# Patient Record
Sex: Female | Born: 1955
Health system: Southern US, Community
[De-identification: ages and names within clinical notes are randomized; demographics above are authoritative.]

## PROBLEM LIST (undated history)

## (undated) DIAGNOSIS — I10 Essential (primary) hypertension: Secondary | ICD-10-CM

## (undated) DIAGNOSIS — R7303 Prediabetes: Secondary | ICD-10-CM

## (undated) DIAGNOSIS — M533 Sacrococcygeal disorders, not elsewhere classified: Secondary | ICD-10-CM

## (undated) DIAGNOSIS — M1990 Unspecified osteoarthritis, unspecified site: Secondary | ICD-10-CM

## (undated) DIAGNOSIS — D75A Glucose-6-phosphate dehydrogenase (G6PD) deficiency without anemia: Secondary | ICD-10-CM

## (undated) DIAGNOSIS — Z973 Presence of spectacles and contact lenses: Secondary | ICD-10-CM

## (undated) DIAGNOSIS — D649 Anemia, unspecified: Secondary | ICD-10-CM

## (undated) DIAGNOSIS — R519 Headache, unspecified: Secondary | ICD-10-CM

## (undated) DIAGNOSIS — F329 Major depressive disorder, single episode, unspecified: Secondary | ICD-10-CM

## (undated) DIAGNOSIS — G459 Transient cerebral ischemic attack, unspecified: Secondary | ICD-10-CM

## (undated) DIAGNOSIS — G971 Other reaction to spinal and lumbar puncture: Secondary | ICD-10-CM

## (undated) DIAGNOSIS — R51 Headache: Secondary | ICD-10-CM

## (undated) DIAGNOSIS — F32A Depression, unspecified: Secondary | ICD-10-CM

## (undated) DIAGNOSIS — Z8719 Personal history of other diseases of the digestive system: Secondary | ICD-10-CM

## (undated) DIAGNOSIS — K219 Gastro-esophageal reflux disease without esophagitis: Secondary | ICD-10-CM

## (undated) DIAGNOSIS — M797 Fibromyalgia: Secondary | ICD-10-CM

## (undated) DIAGNOSIS — J45909 Unspecified asthma, uncomplicated: Secondary | ICD-10-CM

## (undated) DIAGNOSIS — M5416 Radiculopathy, lumbar region: Secondary | ICD-10-CM

## (undated) HISTORY — DX: Headache: R51

## (undated) HISTORY — DX: Radiculopathy, lumbar region: M54.16

## (undated) HISTORY — PX: LUMBAR LAMINECTOMY: SHX95

## (undated) HISTORY — PX: COLONOSCOPY: SHX174

## (undated) HISTORY — PX: TONSILLECTOMY: SUR1361

## (undated) HISTORY — PX: SPINAL CORD STIMULATOR IMPLANT: SHX2422

## (undated) HISTORY — PX: MULTIPLE TOOTH EXTRACTIONS: SHX2053

## (undated) HISTORY — DX: Unspecified osteoarthritis, unspecified site: M19.90

## (undated) HISTORY — PX: DILATION AND CURETTAGE OF UTERUS: SHX78

## (undated) HISTORY — DX: Essential (primary) hypertension: I10

## (undated) HISTORY — PX: NASAL SINUS SURGERY: SHX719

## (undated) HISTORY — DX: Headache, unspecified: R51.9

## (undated) HISTORY — PX: BREAST SURGERY: SHX581

## (undated) HISTORY — PX: ABDOMINAL HYSTERECTOMY: SHX81

## (undated) HISTORY — DX: Fibromyalgia: M79.7

## (undated) HISTORY — PX: BUNIONECTOMY: SHX129

## (undated) HISTORY — PX: KNEE ARTHROSCOPY W/ MENISCAL REPAIR: SHX1877

---

## 1983-11-04 HISTORY — PX: BREAST LUMPECTOMY: SHX2

## 1983-11-04 HISTORY — PX: BREAST EXCISIONAL BIOPSY: SUR124

## 1998-09-18 ENCOUNTER — Ambulatory Visit (HOSPITAL_COMMUNITY): Admission: RE | Admit: 1998-09-18 | Discharge: 1998-09-18 | Payer: Self-pay | Admitting: Family Medicine

## 1998-09-18 ENCOUNTER — Encounter: Payer: Self-pay | Admitting: Family Medicine

## 1999-11-28 ENCOUNTER — Ambulatory Visit (HOSPITAL_COMMUNITY): Admission: RE | Admit: 1999-11-28 | Discharge: 1999-11-28 | Payer: Self-pay | Admitting: Family Medicine

## 1999-11-28 ENCOUNTER — Encounter: Payer: Self-pay | Admitting: Family Medicine

## 2000-11-03 HISTORY — PX: BACK SURGERY: SHX140

## 2000-12-05 ENCOUNTER — Encounter: Payer: Self-pay | Admitting: Family Medicine

## 2000-12-05 ENCOUNTER — Ambulatory Visit (HOSPITAL_COMMUNITY): Admission: RE | Admit: 2000-12-05 | Discharge: 2000-12-05 | Payer: Self-pay | Admitting: Family Medicine

## 2000-12-17 ENCOUNTER — Encounter: Payer: Self-pay | Admitting: Neurosurgery

## 2000-12-17 ENCOUNTER — Inpatient Hospital Stay (HOSPITAL_COMMUNITY): Admission: RE | Admit: 2000-12-17 | Discharge: 2000-12-18 | Payer: Self-pay | Admitting: Neurosurgery

## 2001-02-02 ENCOUNTER — Encounter: Payer: Self-pay | Admitting: Family Medicine

## 2001-02-02 ENCOUNTER — Encounter: Admission: RE | Admit: 2001-02-02 | Discharge: 2001-02-02 | Payer: Self-pay | Admitting: Family Medicine

## 2001-03-05 ENCOUNTER — Ambulatory Visit (HOSPITAL_COMMUNITY): Admission: RE | Admit: 2001-03-05 | Discharge: 2001-03-05 | Payer: Self-pay | Admitting: Neurosurgery

## 2001-03-05 ENCOUNTER — Encounter: Payer: Self-pay | Admitting: Neurosurgery

## 2001-03-08 ENCOUNTER — Inpatient Hospital Stay (HOSPITAL_COMMUNITY): Admission: RE | Admit: 2001-03-08 | Discharge: 2001-03-12 | Payer: Self-pay | Admitting: Neurosurgery

## 2001-04-22 ENCOUNTER — Encounter: Payer: Self-pay | Admitting: Family Medicine

## 2001-04-22 ENCOUNTER — Encounter: Admission: RE | Admit: 2001-04-22 | Discharge: 2001-04-22 | Payer: Self-pay | Admitting: Family Medicine

## 2001-06-10 ENCOUNTER — Ambulatory Visit (HOSPITAL_COMMUNITY): Admission: RE | Admit: 2001-06-10 | Discharge: 2001-06-10 | Payer: Self-pay | Admitting: Family Medicine

## 2001-06-10 ENCOUNTER — Encounter: Payer: Self-pay | Admitting: Family Medicine

## 2002-07-21 ENCOUNTER — Encounter: Admission: RE | Admit: 2002-07-21 | Discharge: 2002-07-21 | Payer: Self-pay | Admitting: Family Medicine

## 2002-07-21 ENCOUNTER — Encounter: Payer: Self-pay | Admitting: Family Medicine

## 2003-10-03 ENCOUNTER — Encounter: Admission: RE | Admit: 2003-10-03 | Discharge: 2003-10-03 | Payer: Self-pay | Admitting: Family Medicine

## 2003-11-02 ENCOUNTER — Ambulatory Visit (HOSPITAL_COMMUNITY): Admission: RE | Admit: 2003-11-02 | Discharge: 2003-11-02 | Payer: Self-pay | Admitting: Family Medicine

## 2004-05-14 ENCOUNTER — Encounter (INDEPENDENT_AMBULATORY_CARE_PROVIDER_SITE_OTHER): Payer: Self-pay | Admitting: Specialist

## 2004-05-14 ENCOUNTER — Ambulatory Visit (HOSPITAL_BASED_OUTPATIENT_CLINIC_OR_DEPARTMENT_OTHER): Admission: RE | Admit: 2004-05-14 | Discharge: 2004-05-14 | Payer: Self-pay | Admitting: Otolaryngology

## 2004-05-14 ENCOUNTER — Ambulatory Visit (HOSPITAL_COMMUNITY): Admission: RE | Admit: 2004-05-14 | Discharge: 2004-05-14 | Payer: Self-pay | Admitting: Otolaryngology

## 2004-09-30 ENCOUNTER — Encounter: Admission: RE | Admit: 2004-09-30 | Discharge: 2004-09-30 | Payer: Self-pay | Admitting: Family Medicine

## 2004-12-23 ENCOUNTER — Ambulatory Visit (HOSPITAL_COMMUNITY): Admission: RE | Admit: 2004-12-23 | Discharge: 2004-12-23 | Payer: Self-pay | Admitting: Family Medicine

## 2005-02-24 ENCOUNTER — Encounter: Admission: RE | Admit: 2005-02-24 | Discharge: 2005-02-24 | Payer: Self-pay | Admitting: Allergy and Immunology

## 2005-11-20 ENCOUNTER — Other Ambulatory Visit: Admission: RE | Admit: 2005-11-20 | Discharge: 2005-11-20 | Payer: Self-pay | Admitting: Obstetrics and Gynecology

## 2005-12-24 ENCOUNTER — Ambulatory Visit (HOSPITAL_COMMUNITY): Admission: RE | Admit: 2005-12-24 | Discharge: 2005-12-24 | Payer: Self-pay | Admitting: Family Medicine

## 2007-08-15 ENCOUNTER — Encounter: Admission: RE | Admit: 2007-08-15 | Discharge: 2007-08-15 | Payer: Self-pay | Admitting: Family Medicine

## 2009-04-27 DIAGNOSIS — R0602 Shortness of breath: Secondary | ICD-10-CM | POA: Insufficient documentation

## 2011-03-21 NOTE — H&P (Signed)
[place]. [facility]  Patient:    Ashley [name], Ashley [name]                     MRN: [id number] Adm. Date:  [id number] Disc. Date: [id number] Attending:  Barton Fanny                         History and Physical  HISTORY OF PRESENT ILLNESS:  The patient is a 55 year old right-handed black female who was evaluated in the past for degenerative changes in the lumbar spine and returns for evaluation of a severe left lumbar radiculopathy that developed about 7 weeks ago and has been unremitting.  She complains of pain in the midline of the low back that extends down to the left side of her low back, into her left buttock and groin as well as into left calf towards the left great toe.  She describes dysesthesia from the left calf into the left great toe with no numbness.  She is unsure of any specific weakness.  She describes that the pain is particularly aggravated when she changes position such as getting up from a laying or seated position or also standing for any period of time will aggravate the pain.  She is clearly in quite a bit of discomfort and mobility is limited.  Thus far she has been working up until last week but was unable to continue to work because of the severity of her pain.  She has occasional spasms in her lower extremities.  She has been treated with a number of medications including prednisone, hydrocodone, vioxx, Flexeril, methyl carbinol and Tylenol III but nothing has given her relief.  PAST MEDICAL HISTORY:  Notable for: 1. A history of irritable bowel syndrome which has been somewhat better with    diet modification. 2. She does not describe any history of hypertension, myocardial infarction,    cancer, stroke, diabetes, or lung disease.  PREVIOUS SURGERY: 1. Includes a left breast I&D in 1992 2. Partial vaginal hysterectomy in 1997. 3. Right bunionectomy in 1996.  ALLERGIES:  She reports an allergy to sulfa  medications.  CURRENT MEDICATIONS: 1. Vioxx. 2. Celexa 20 mg q. day for depression. 3. Tylenol #3.  FAMILY HISTORY:  Her father died at age 26 of a brain aneurysm and her mother is in good health at age 68.  There is a maternal family history of diabetes and hypertension and a paternal family history of liver disease and aneurysm.  SOCIAL HISTORY:  The patient is married.  She works as a Producer, television/film/video for [employer].  She does not smoke, drink alcoholic beverages or have a history of substance abuse.  REVIEW OF SYSTEMS:  Notable for about a 10 pound weight gain associated with prednisone and lack of activity.  She has had depression related to marital difficulties for which she was started on celexa by her primary physician. Review of systems is otherwise unremarkable.  PHYSICAL EXAMINATION:  The patient is a well-developed well-nourished black female in obvious discomfort.  Her mobility is limited.  VITAL SIGNS:  Temperature is 98.0, pulse 84, blood pressure 140/86, respiratory rate 14, height 5 foot 8 inches, weight 193 pounds.  LUNGS:  Clear to auscultation.  She has symmetrical respiratory excursion.  HEART:  A regular rhythm, normal S1, S2.  There is no murmur.  ABDOMEN:  Soft.  Nondistended.  Bowel sounds are present.  EXTREMITY:  Shows no clubbing, cyanosis,  or edema.  MUSCULOSKELETAL:  Shows no tenderness to palpation over the lumbar spinous process or paralumbar musculature.  She is limited to forward flexion though to about 30 degrees due to pain she is limited in extensor as well due to pain.  Straight leg raising was negative bilaterally.  NEUROLOGIC:  Shows 5/5 strength in the lower extremities including the dorsiflexors, plantarflexors and extensor hallucis longus.  Sensation is hyperesthetic to pain in the medial aspect of her left foot but is otherwise intact.  Reflexes are 2 at the quadriceps and gastrocnemii.  They are symmetrical  bilaterally. Toes are downgoing bilaterally.  She is limited in her ability to stand due to pain and discomfort.  LABORATORY DATA:  MRI scan of the lumbar spine and lumbosacral spine x-rays series and ______ at [facility] was done last week.  The x-rays showed no significant degenerative changes, however, the MRI shows evidence of degenerative disk disease most prominent at the L4-5 level with desiccation of the disk or mild changes seen at the L5-S1 and L3-4 levels associated with degenerative disk disease is disk bulging particularly at the L4-5 level particularly laterally to the left.  Unfortunately the axial images are limited in quality due to motion artifact but there is significant lateral recess encroachment on the left side at L4-5 due to facet hypertrophy and degenerative disk bulging and one cannot rule out the possibility of disk herniation to the left at the L4-5 level.  IMPRESSION:  Left L5 radiculopathy ______ to the left L4-5 lateral recess encroachment with nerve root compression with possible disk herniation.  PLAN:  WE have discussed alternatives to surgery as well as the possibility of surgical intervention via left L4-5 lumbar laminotomy and foraminotomy and possible microdiskectomy.  She wished to proceed with surgery and therefore is admitted for such.  We did discuss the nature of the surgery, typical length of surgery, and hospital stay and overall recuperation and limitations during the postoperative period and risks of surgery including risk of infection, bleeding, possible need for transfusion, the risk of nerve root dysfunction with pain, weakness, numbness and paresthesias.  The risk of recurrent disk herniation, possible need for further surgery and anesthetic risks of myocardial infarction, stroke, pneumonia and death.  Understanding all of this she does to wish to proceed with surgery and is admitted for such. DD:  12/17/00 TD:   12/17/00 Job: [id number]

## 2011-03-21 NOTE — Discharge Summary (Signed)
[place]. [facility]  Patient:    Ashley [name], Ashley [name]                     MRN: [id number] Adm. Date:  [id number] Disc. Date: [id number] Attending:  Barton Fanny                           Discharge Summary  ADMISSION HISTORY:  The patient is a 55 year old woman who is nearly three months status post left L4-L5 lumbar laminotomy and foraminotomy.  She did well, but over six weeks after surgery she developed a positional headache.  A postlaminotomy CSF leak was suspected.  She was placed on bed rest, and it resolved, and in fact, she returned to work on a part-time basis, but the headaches recurred last week.  She was studied with a lumbar myelogram and postmyelogram CT scan which confirmed the presence of a CSF leak, and decision made to admit the patient for laminotomy and repair of dural defect.  PHYSICAL EXAMINATION ON ADMISSION:  GENERAL:  General examination was unremarkable.  MUSCULOSKELETAL:  Did reveal a well-healed lumbar incision.  There was no swelling beneath it.  NEUROLOGIC:  Intact.  HOSPITAL COURSE:  The patient was admitted, underwent left L4-L5 lumbar laminotomy and repair of a postlaminotomy dural defect.  Postoperatively, she had difficulties with urinary retention.  She had high postvoid residuals on several occasions, and a Foley catheter was placed for a day and a half and then discontinued.  Postvoid residuals were checked once again and were normal.  Urinalysis and urine culture were checked, and both were negative. The patient has had some discomfort in the right side of her back and into her right buttocks opposite the side of her surgery, but she has had no further headaches or positional headaches.  She has been able to steadily increase her ambulation and activities.  Her wound is healing well.  She is afebrile.  DISCHARGE INSTRUCTIONS:  She is being discharged to home with instructions regarding wound care and  activities.  She has been cautioned to avoid any bending, lifting, or straining.  DISCHARGE MEDICATIONS:  She has been advised to use: 1. Aleve two tablets b.i.d. 2. Peri-Colace one capsule b.i.d. 3. Percocet one or two tablets p.o. q.4-6h. p.r.n. pain, #60 tablets    prescribed, no refills. 4. Magic Mouthwash (due to small sores on the inside of her mouth on the right    side due to the endotracheal tube most likely).  We have prescribed 5 cc    q.i.d., 200 cc prescribed, and one refill. 5. Flexeril 10 mg q.h.s., #30 tablets, and one refill.  DISCHARGE FOLLOWUP:  She has an appointment scheduled in about two and a half weeks, and if she has any difficulties prior to that she will let me know.  DISCHARGE DIAGNOSIS:  Postlaminotomy cerebrospinal fluid leak (delayed). DD:  03/12/01 TD:  03/15/01 Job: [id number]

## 2011-03-21 NOTE — Op Note (Signed)
. [facility]  Patient:    Ashley [name], Ashley [name]                     MRN: [id number] Proc. Date: 12/17/00 Adm. Date:  [id number] Disc. Date: [id number] Attending:  Barton Fanny                           Operative Report  PREOPERATIVE DIAGNOSIS:  Lumbar spondylosis, degenerative disk disease and radiculopathy.  POSTOPERATIVE DIAGNOSIS:  Lumbar spondylosis, degenerative disk disease and radiculopathy.  PROCEDURE:  Left L4-5 lumbar laminotomy and foraminotomy.  SURGEON:  Hewitt Shorts, M.D.  ASSISTANT:  Stefani Dama, M.D.  ANESTHESIA:  General endotracheal.  INDICATIONS:   Patient is a 55 year old woman, who presented with an acute left lumbar radiculopathy, who was found by MRI scan to have degenerative disk disease and spondylosis with lateral recess encroachment on the left side at L4-5.  Decision was made to proceed with a laminotomy and foraminotomy and possible diskectomy if significant disk herniation was found. PROCEDURE:  Patient brought to the operating room and placed under general endotracheal anesthesia.  Patient was turned to a prone position, lumbar region was prepped with Betadine soap and solution and draped in a sterile fashion.  A midline incision was infiltrated with local anesthesia with epinephrine and localizing x-ray was taken and then a midline incision made, carried down through subcutaneous tissue.  Bipolar cautery and electrocautery used to maintain hemostasis.  Dissection was carried down to the lumbar fascia, which was incised from the left side of the midline.  The paraspinous muscles were dissected from the spinous process and lamina in a subperiosteal fashion.  An x-ray was taken and the L4-5 intralaminar space was identified. A laminotomy was performed using the [facility] Max drill, as well as the Kerrison punch.  Ligamentum flavum was removed as well and foraminotomy was performed for the L4 and the L5  nerve roots.  There was particular spondylitic overgrowth within the L4-5 foramen and this was carefully removed.  We did examine the L4-5 disk and it clearly was degenerated and bulging slightly, but there was no discreet herniation and through the laminotomy and foraminotomy, the thecal sac and nerve roots were well-decompressed.  Therefore, no diskectomy was performed.  Once the decompression was completed, hemostasis was established with the use of bipolar cautery, as well as Gelfoam soaked in thrombin and then all of the Gelfoam was removed and hemostasis was again confirmed and then we instilled 2 cc of fentanyl and 80 mg of Depo Medrol in the epidural space and proceeded with closure.  The deep fascia was closed with interrupted, undyed 0 Vicryl sutures and the subcutaneous and subcuticular layers closed with interrupted, inverted 2-0 undyed Vicryl sutures and the skin edges were approximated with Dermabond.  The patient tolerated the procedure well.  The estimated blood loss was less than 50 cc. Sponge and needle counts were correct.  Following surgery, the patient was turned back to a supine position to be reversed from anesthetic, extubated and transferred to the recovery room for further care. DD:  12/17/00 TD:  12/17/00 Job: [id number]

## 2011-03-21 NOTE — Op Note (Signed)
NAME:  Ashley [name], Ashley [name]                        ACCOUNT NO.:  [id number]   MEDICAL RECORD NO.:  [id number]                   PATIENT TYPE:  AMB   LOCATION:  DSC                                  FACILITY:  [facility]   PHYSICIAN:  Christopher E. Ezzard Standing, M.D.         DATE OF BIRTH:  06-01-1956   DATE OF PROCEDURE:  05/14/2004  DATE OF DISCHARGE:                                 OPERATIVE REPORT   PREOPERATIVE DIAGNOSES:  1. Chronic and recurrent sinus disease, bilateral anterior ethmoid disease     and bilateral maxillary ostial disease.  2. Turbinate hypertrophy with nasal obstruction.  3. Recurrent hoarseness.   POSTOPERATIVE DIAGNOSES:  1. Chronic and recurrent sinus disease, bilateral anterior ethmoid disease     and bilateral maxillary ostial disease.  2. Turbinate hypertrophy with nasal obstruction.  3. Recurrent hoarseness.   OPERATION:  1. Direct laryngoscopy.  2. Functional endoscopic sinus surgery with bilateral anterior     ethmoidectomies, bilateral maxillary ostial enlargement.  3. Bilateral inferior turbinate reductions.   SURGEON:  Kristine Garbe. Ezzard Standing, M.D.   ANESTHESIA:  General endotracheal.   COMPLICATIONS:  None.   BRIEF CLINICAL NOTE:  Ashley [name] is a 55 year old female who has had  chronic sinus infections in the past, generally 2 or 3 times a year.  She  has chronic intermittent nasal obstruction, right side much worse than the  left.  She underwent a CT scan which showed bilateral ethmoid and maxillary  sinus disease with relatively clear sphenoid and frontal sinuses.  She has  been on several rounds of antibiotics including amoxicillin, Ketek and  Augmentin.  She continued to complain of postnasal drainage and congestion  in her nose, pressure behind her eyes, right side worse than the left.  She  does have history of allergies and takes Nasacort as well as Claritin and  Zyrtec.  She also has a history of GE reflux problems and takes Prevacid.  She  has had intermittent bouts of hoarseness and complains of a lump in her  throat.  She is taken to the operating room at this time for functional  endoscopic sinus surgery, turbinate reduction to help improve her nasal  airway as well as improve her sinus drainage.  At the same time, because of  her recurrent hoarseness and throat complaints, we will perform direct  laryngoscopy.   DESCRIPTION OF PROCEDURE:  After adequate endotracheal anesthesia, the  patient received 1 g IV preoperatively as well 10 mg of Decadron IV  preoperatively.  First, direct laryngoscopy was performed.  Using the  anterior laryngoscope, the base of tongue was clear.  There was a fair  amount of lymphoid hyperplasia on the base of tongue but this all appeared  benign.  The vallecular area was clear, epiglottis was normal, both  piriformis sinuses were clear.  On evaluation of the endolarynx and vocal  cords, Ashley [name] had some mild erythema and edema of the vocal cords but  no  polyps, had a little bit of thickening of the vocal cords anteriorly  consistent with early or mild vocal cord nodules but no discrete vocal cord  pathology noted and no significant lesions requiring biopsy.  This completed  direct laryngoscopy.  Following this, the nose was prepped with a Betadine  solution; nose was then further injected with Xylocaine with epinephrine and  cotton pledgets soaked in topical decongestant were placed.  First, the  right side was approached.  Using the endoscopes, the uncinate process was  incised with a sickle knife and removed.  The ethmoid bulla in the anterior  ethmoid area was opened up with a straight-through cup and up-through cup  forceps.  There was really minimal disease noted in the ethmoid sinus at  this time.  The maxillary ostium was identified and was enlarged with  straight-through cup and back-biting forceps.  Of note, the patient had a  very small maxillary sinus which was laterally displaced.   There was a  little bit of white mucus within the maxillary sinus which was aspirated.  A  few of the posterior ethmoid cells were opened up but these were all clear  of any disease.  This completed the right side.  Next, the left side was  approached.  Again, the uncinate process was incised with a sickle knife and  was removed with straight-through cup forceps.  The anterior ethmoid cells  were opened up with straight-through cup and up-through cup forceps.  There  were no polyps and no significant disease noted.  Maxillary ostium was  enlarged with back-biting and straight-through cup forceps.  Again, as on  the right side, the left maxillary sinus was small and was laterally  displaced.  There was no significant disease in the left maxillary sinus.  Following this, inferior turbinates were reduced.  The turbinates were  injected with Xylocaine with epinephrine.  Incision was made along the  inferior edge of the turbinate.  Mucosa was elevated off the turbinate bone  bilaterally.  Turbinate bone was then removed and the suction cautery was  used to produce submucosal cauterization of the submucosal tissues of the  turbinates.  The remaining turbinate bone was out-fractured and the  posterior turbinate was cauterized to reduce the posterior portion of the  turbinate.  This completed the procedure.  Kennedy sinus packs were placed  within the ethmoid sinuses bilaterally.  The patient was subsequently awoken  from anesthesia and transferred to the recovery room postop doing well.   DISPOSITION:  Ashley [name] is discharged home later this morning on Keflex 500 mg  b.i.d. for 1 week, Tylenol and Vicodin 1-2 q.4 h. p.r.n. pain.  We will have  her follow up in my office in 3 days for recheck and to remove the [facility]  sinus packs.                                               Kristine Garbe. Ezzard Standing, M.D.    CEN/MEDQ  D:  05/14/2004  T:  05/14/2004  Job:  [id number]  cc:   Donia Guiles, M.D.   [address] [place]  [place] [address]  Fax: [phone]   Jessica Priest, M.D.  [address]  [place] [address]  Fax: [phone]

## 2011-03-21 NOTE — H&P (Signed)
[place]. [facility]  Patient:    Ashley [name], Ashley [name]                     MRN: [id number] Adm. Date:  [id number] Attending:  Barton Fanny                         History and Physical  HISTORY OF PRESENT ILLNESS:  The patient is a 55 year old right-handed black female who is nearly three months status post left L4-5 lumbar laminotomy and foraminotomy for lateral recess encroachment and nerve root impingement.  She had done well initially following surgery but over six weeks after surgery she developed positional headache.  A post laminectomy CSF leak was suspected. She was placed on bedrest for several days and it resolved.  She was, in fact, able to return to work on a part-time basis but the headaches recurred and last week she was studied with a lumbar myelogram and postmyelogram CT scan which did confirm the presence of a CSF leak.  It appears to be originating down near the level of the nerve root within the foramen and decision was made to proceed with a repair of this CSF leak.  Past medical history, previous surgeries, allergies, medications, family history, and social history are as per the December 17, 2000 admission note and there has been no interval change.  REVIEW OF SYSTEMS:  Notable for those difficulties described in her history of present illness and past medical history; also history of depression for which she takes Celexa.  PHYSICAL EXAMINATION:  GENERAL:  Patient is a well-developed, well-nourished black female who has been disabled by her positional headaches.  VITAL SIGNS:  Temperature 97.6, pulse 91, blood pressure 135/73, respiratory rate 14.  Height 5 feet 8 inches, weight 193 pounds.  LUNGS:  Clear to auscultation.  She has symmetrical respiratory excursion.  HEART:  Has a regular rate and rhythm, normal S1, S2.  There is no murmur.  ABDOMEN:  Soft, nondistended.  Bowel sounds are present.  EXTREMITIES:  Shows no  clubbing, cyanosis, or edema.  MUSCULOSKELETAL:  Shows a well-healed lumbar incision.  There is no swelling or subcutaneous collection.  NEUROLOGIC:  Shows 5/5 strength throughout the lower extremities.  Sensation is intact to pinprick.  Reflexes are symmetrical.  Toes are downgoing.  IMPRESSION:  Delayed post laminectomy cerebrospinal fluid leak that has not responded to conservative treatment.  RECOMMENDATIONS:  I discussed my assessment and impression with the patient, recommended exploration of the laminotomy with, most likely, extending the laminotomy and fasciectomy so as to adequately expose the dural surfaces and proceed with repair.  I discussed the nature of the procedure; typical length of surgery, hospital stay, and overall recuperation and limitations postoperatively; and risks including risk of infection, bleeding, possible need for transfusion; the risk of nerve root dysfunction with pain, weakness, numbness, or paresthesias; the risk of recurrent CSF leak and possible need for further surgery; and anesthetic risks of myocardial infarction, stroke, pneumonia, and death.  Understanding all this she does wish to proceed with surgery and is admitted for such.DD:  03/08/01 TD:  03/08/01 Job: [id number]

## 2011-03-21 NOTE — Op Note (Signed)
[place]  Patient:    Ashley [name], Ashley [name]                     MRN: [id number] Proc. Date: 03/08/01 Adm. Date:  [id number] Attending:  Barton Fanny                           Operative Report  PREOPERATIVE DIAGNOSIS:  Post lumbar laminectomy cerebrospinal fluid leak.  POSTOPERATIVE DIAGNOSIS:  Post lumbar laminectomy cerebrospinal fluid leak.  OPERATION:  Left L4-5 lumbar laminotomy and repair of post laminotomy dural defect.  SURGEON:  Hewitt Shorts, M.D.  ASSISTANT:  Danae Orleans. Venetia Maxon, M.D.  ANESTHESIA:  General endotracheal  INDICATION:  The patient is a 55 year old woman who nearly three months ago underneath a left L4-5 lumbar laminotomy and foraminotomy.  She did well initially but in a delayed fashion about six weeks after surgery began to develop positional headaches.  She was treated nonsurgically initially and improved but the headaches subsequently recurred and she was studied with a myelogram and post myelogram CT scan which demonstrated CSF leak and a decision was made to proceed with exploration laminotomy and repair of the dural defect.  DESCRIPTION OF PROCEDURE:  The patient was brought to the operating room and placed under general endotracheal anesthesia.  The patient was turned to a prone position and the lumbar region was prepped with Duraprep and draped in a sterile fashion.  The patient had mild keloid formation and therefore the previous scar was excised in an elliptical fashion.  We dissected down to the subcutaneous tissue to the fascia which was intact.  The lumbar fascia was opened and we dissected down along the spinous processes and lamina retracting the paraspinal muscles laterally.  Again no pseudomeningocele was encountered. We were able to dissect down and identify the previous left L4-5 laminotomy site.  It was filled with scar.  This scar was mobilized with a curet and CSF leaked out.  We then  draped the microscope and continued the procedure using microdissection and microsurgical technique.  We extended the laminotomy superiorly, inferiorly and laterally using the [facility] Max drill and Kerrison punches.  Care was taken, leaving the underlying dura undisturbed.  We were able to finally identify the dural defect.  There was a lot of scarring over the dura and this was carefully immobilized.  Through the small hole near the superior end of the previous laminotomy and along the lateral aspect of the thecal sac, we could see a small bit of nerve root protruding through the dural defect. We were able to better expose this area.  The nerve root was intact and easily retracted back into the thecal sac.  We were able to close the dural defect with a single 6-0 Prolene suture.  The patient was Valsalvaed and no CSF leakage was noted through the previous dural defect.  We then instilled Tisseel around the dural repair and then the wound was closed in multiple layers.  The deep fascia closed with interrupted undyed 0 Vicryl sutures.  The subcutaneous layer was closed with interrupted inverted undyed 0 Vicryl sutures and the subcutaneous and subcuticular layer were closed with interrupted inverted 2-0 undyed Vicryl suture.  The skin edges were approximated with Dermabond.  The patient tolerated the procedure well.  The estimated blood loss was less than 50 cc.  Sponge and needle count were correct. DD:  03/08/01 TD:  03/09/01 Job:  [id number]

## 2011-03-21 NOTE — Op Note (Signed)
[place]  Patient:    Ashley [name], Ashley [name]                     MRN: [id number] Proc. Date: 03/08/01 Adm. Date:  [id number] Attending:  Barton Fanny                           Operative Report  PREOPERATIVE DIAGNOSIS:  Post lumbar laminectomy cerebrospinal fluid leak.  POSTOPERATIVE DIAGNOSIS:  Post lumbar laminectomy cerebrospinal fluid leak.  PROCEDURE:  Left L4-5 lumbar laminotomy and repair of postlaminotomy dural defect.  SURGEON:  Hewitt Shorts, M.D.  ASSISTANT:  Danae Orleans. Venetia Maxon, M.D.  ANESTHESIA:  General endotracheal.  INDICATION:  The patient is a 55 year old woman who nearly three months ago underwent a left L4-5 lumbar laminotomy and foraminotomy.  She did well initially, but in a delayed fashion about six weeks after surgery began to develop positional headaches.  She was treated nonsurgically initially and improved, but the headaches subsequently recurred, and she was studied with a myelogram plus myelogram CT scan, which demonstrated a CSF leak, and a decision was made to proceed with exploration, laminotomy, and repair of the dural defect.  DESCRIPTION OF PROCEDURE:  The patient was brought to the operating room and placed under general endotracheal anesthesia.  Patient was turned to a prone position.  The lumbar region was prepped with Duraprep and draped in a sterile fashion.  The patient had mild keloid formation, and therefore the previous scar was excised in elliptical fashion.  We dissected down through the subcutaneous tissue to the fascia, which was intact.  The lumbar fascia was opened, and we dissected down along the spinous processes and laminae, retracting the paraspinal muscles laterally.  Again no pseudomeningocele was encountered.  We were able to dissect down and identify the previous left L4-5 laminotomy site.  It was filled with scar.  This scar was mobilized with a curette, and CSF leaked out.   We then draped the microscope and continued the procedure using microdissection and microsurgical technique.  We extended the laminotomy superiorly, inferiorly, and laterally, using the [facility] Max drill and Kerrison punches.  Care was taken to leave the underlying dura undisturbed.  We were able to finally identify the dural defect.  There was a lot of scarring over the dura, and this was carefully mobilized.  Through the small hole at the superior end of the previous laminotomy and below the lateral aspect of the thecal sac, we could see a small bit of nerve root protruding through the dural defect.  We were able to better expose this area. The nerve root was intact and easily retracted back into the thecal sac.  We were able to close the dural defect with a single 6-0 Prolene suture.  The patient was valsalva-ed, and no CSF leakage was noted through the previous dural defect.  We then instilled Tisseel around the dural repair, and then the wound was closed in multiple layers, with the deep fascia closed with interrupted, undyed 0 Vicryl sutures, the subcutaneous layer was closed with interrupted, inverted, undyed 0 Vicryl sutures, and the subcutaneous and subcuticular were closed with interrupted, inverted 2-0 undyed Vicryl sutures.  Skin was reapproximated with Dermabond.  The patient tolerated the procedure well.  The estimated blood loss was less than 50 cc.  Sponge and needle count were correct. DD:  03/08/01 TD:  03/09/01 Job: [id number]

## 2011-08-26 DIAGNOSIS — M961 Postlaminectomy syndrome, not elsewhere classified: Secondary | ICD-10-CM | POA: Insufficient documentation

## 2011-11-04 DIAGNOSIS — R0600 Dyspnea, unspecified: Secondary | ICD-10-CM

## 2011-11-04 HISTORY — DX: Dyspnea, unspecified: R06.00

## 2012-04-14 DIAGNOSIS — D75A Glucose-6-phosphate dehydrogenase (G6PD) deficiency without anemia: Secondary | ICD-10-CM | POA: Insufficient documentation

## 2012-05-21 DIAGNOSIS — K222 Esophageal obstruction: Secondary | ICD-10-CM | POA: Insufficient documentation

## 2012-06-16 DIAGNOSIS — R030 Elevated blood-pressure reading, without diagnosis of hypertension: Secondary | ICD-10-CM | POA: Insufficient documentation

## 2012-11-16 DIAGNOSIS — Z01419 Encounter for gynecological examination (general) (routine) without abnormal findings: Secondary | ICD-10-CM | POA: Insufficient documentation

## 2012-12-08 DIAGNOSIS — M1710 Unilateral primary osteoarthritis, unspecified knee: Secondary | ICD-10-CM | POA: Insufficient documentation

## 2013-06-01 DIAGNOSIS — M25519 Pain in unspecified shoulder: Secondary | ICD-10-CM | POA: Insufficient documentation

## 2013-06-01 DIAGNOSIS — G5620 Lesion of ulnar nerve, unspecified upper limb: Secondary | ICD-10-CM | POA: Insufficient documentation

## 2013-06-01 DIAGNOSIS — M7918 Myalgia, other site: Secondary | ICD-10-CM | POA: Insufficient documentation

## 2013-06-01 DIAGNOSIS — M25531 Pain in right wrist: Secondary | ICD-10-CM | POA: Insufficient documentation

## 2013-06-01 DIAGNOSIS — M549 Dorsalgia, unspecified: Secondary | ICD-10-CM | POA: Insufficient documentation

## 2013-06-01 DIAGNOSIS — M792 Neuralgia and neuritis, unspecified: Secondary | ICD-10-CM | POA: Insufficient documentation

## 2013-06-01 DIAGNOSIS — M62838 Other muscle spasm: Secondary | ICD-10-CM | POA: Insufficient documentation

## 2013-11-21 ENCOUNTER — Ambulatory Visit (HOSPITAL_COMMUNITY)
Admission: RE | Admit: 2013-11-21 | Discharge: 2013-11-21 | Disposition: A | Discharge status: Home / Self Care | Payer: 59 | Source: Ambulatory Visit | Attending: Gastroenterology | Admitting: Gastroenterology

## 2013-11-21 ENCOUNTER — Encounter (HOSPITAL_COMMUNITY)
Admission: RE | Disposition: A | Discharge status: Home / Self Care | Payer: Self-pay | Source: Ambulatory Visit | Attending: Gastroenterology

## 2013-11-21 DIAGNOSIS — R1310 Dysphagia, unspecified: Secondary | ICD-10-CM | POA: Insufficient documentation

## 2013-11-21 HISTORY — PX: ESOPHAGEAL MANOMETRY: SHX5429

## 2013-11-21 SURGERY — MANOMETRY, ESOPHAGUS

## 2013-11-21 MED ORDER — LIDOCAINE VISCOUS 2 % MT SOLN
OROMUCOSAL | Status: AC
  Filled 2013-11-21: qty 15

## 2013-11-21 SURGICAL SUPPLY — 1 items: FACESHIELD LNG OPTICON STERILE (SAFETY) IMPLANT

## 2013-11-22 ENCOUNTER — Encounter (HOSPITAL_COMMUNITY): Payer: Self-pay | Admitting: Gastroenterology

## 2013-11-23 ENCOUNTER — Encounter: Payer: Self-pay | Admitting: Podiatry

## 2013-11-23 ENCOUNTER — Ambulatory Visit (INDEPENDENT_AMBULATORY_CARE_PROVIDER_SITE_OTHER): Payer: 59 | Admitting: Podiatry

## 2013-11-23 VITALS — Ht 69.0 in | Wt 208.0 lb

## 2013-11-23 DIAGNOSIS — L600 Ingrowing nail: Secondary | ICD-10-CM

## 2013-11-23 NOTE — Progress Notes (Signed)
   Subjective:    Patient ID: Ashley [name], female    DOB: August 28, 1956, 58 y.o.   MRN: [id number]  HPI Comments: '' LT FOOT GREAT TOENAIL IS SORE AND EVERY TIME WEARING ALL THE TOES ARE HURTING FOR 8 MONTHS. TREATMENT TRIED NEOSPORIN AND CREAM FOR PAIN.''     Review of Systems  HENT: Positive for sinus pressure.   Eyes: Positive for itching.  Endocrine: Positive for cold intolerance and heat intolerance.  Musculoskeletal: Positive for gait problem and joint swelling.  Neurological: Positive for weakness, numbness and headaches.  Hematological: Bruises/bleeds easily.  All other systems reviewed and are negative.       Objective:   Physical Exam  Orientated x493 58 year old black female  Vascular: DP pulses and PT pulses are 2/4 bilaterally. Capillary fill is immediate bilaterally  Neurological: Deferred  Dermatological: Hypertrophic, discolored, incurvated toenails x10. The medial margin of the left hallux toenail is tender to pressure and hurts when walking wearing shoes. Surgical scar right first MPJ noted. Traumatic scar dorsum of left foot noted.  Musculoskeletal: HAV left noted. Relatively long toes 2, 3, 4, with contracture at the PIPJ and DIPJ noted bilaterally.       Assessment & Plan:   Assessment: Ingrowing medial border of the left hallux nail Traumatic nail thickening in all nail plates W09x10. Claw toes 2, 3, 4, bilaterally  Plan: Today offered patient phenol matricectomy to resolve the painful margin the left hallux toenail. She verbally consents to procedure. Also, had a discussion about possible surgical correction of the hammertoes 2 through 4 bilaterally. Patient does have interested in possible surgical correction of the hammertoes in the future.  The left hallux was blocked with 3 cc of 50-50 mixture of 2% plain Xylocaine and 0.5% plain Marcaine. The left hallux was painted with Betadine and exsanguinated. The medial margin the left hallux toenail  was excised and a phenol matricectomy performed. An antibiotic dressing was applied. The tourniquet was released and spontaneous capillary filling time noted in the left hallux. Postoperative oral and written instructions provided.  Reappoint at patient's request

## 2013-11-23 NOTE — Patient Instructions (Signed)

## 2014-01-03 ENCOUNTER — Other Ambulatory Visit (HOSPITAL_COMMUNITY)
Admission: RE | Admit: 2014-01-03 | Discharge: 2014-01-03 | Disposition: A | Discharge status: Home / Self Care | Payer: 59 | Source: Ambulatory Visit | Attending: Obstetrics and Gynecology | Admitting: Obstetrics and Gynecology

## 2014-01-03 ENCOUNTER — Other Ambulatory Visit: Payer: Self-pay | Admitting: Obstetrics and Gynecology

## 2014-01-03 DIAGNOSIS — Z01419 Encounter for gynecological examination (general) (routine) without abnormal findings: Secondary | ICD-10-CM | POA: Insufficient documentation

## 2014-01-03 DIAGNOSIS — Z1151 Encounter for screening for human papillomavirus (HPV): Secondary | ICD-10-CM | POA: Insufficient documentation

## 2014-01-05 ENCOUNTER — Other Ambulatory Visit: Payer: Self-pay

## 2014-01-05 DIAGNOSIS — Z1231 Encounter for screening mammogram for malignant neoplasm of breast: Secondary | ICD-10-CM

## 2014-01-16 ENCOUNTER — Ambulatory Visit
Admission: RE | Admit: 2014-01-16 | Discharge: 2014-01-16 | Disposition: A | Discharge status: Home / Self Care | Payer: 59 | Source: Ambulatory Visit

## 2014-01-16 DIAGNOSIS — Z1231 Encounter for screening mammogram for malignant neoplasm of breast: Secondary | ICD-10-CM

## 2014-05-04 ENCOUNTER — Ambulatory Visit: Payer: 59 | Admitting: Dietician

## 2014-05-11 ENCOUNTER — Other Ambulatory Visit (HOSPITAL_COMMUNITY): Payer: Self-pay | Admitting: Radiology

## 2014-05-11 ENCOUNTER — Ambulatory Visit (HOSPITAL_COMMUNITY): Payer: 59 | Attending: Cardiology | Admitting: Cardiology

## 2014-05-11 DIAGNOSIS — M79604 Pain in right leg: Secondary | ICD-10-CM

## 2014-05-11 DIAGNOSIS — M79609 Pain in unspecified limb: Secondary | ICD-10-CM

## 2014-05-11 DIAGNOSIS — M7989 Other specified soft tissue disorders: Secondary | ICD-10-CM

## 2014-05-11 NOTE — Progress Notes (Signed)
Unilateral lower venous duplex performed  

## 2014-05-15 ENCOUNTER — Encounter (HOSPITAL_COMMUNITY): Payer: 59

## 2014-05-16 ENCOUNTER — Ambulatory Visit: Payer: 59 | Admitting: *Deleted

## 2014-05-27 ENCOUNTER — Emergency Department (HOSPITAL_COMMUNITY)
Admission: EM | Admit: 2014-05-27 | Discharge: 2014-05-27 | Disposition: A | Discharge status: Home / Self Care | Payer: 59 | Attending: Emergency Medicine | Admitting: Emergency Medicine

## 2014-05-27 ENCOUNTER — Encounter (HOSPITAL_COMMUNITY): Payer: Self-pay | Admitting: Emergency Medicine

## 2014-05-27 DIAGNOSIS — M79604 Pain in right leg: Secondary | ICD-10-CM

## 2014-05-27 DIAGNOSIS — IMO0002 Reserved for concepts with insufficient information to code with codable children: Secondary | ICD-10-CM | POA: Insufficient documentation

## 2014-05-27 DIAGNOSIS — M79609 Pain in unspecified limb: Secondary | ICD-10-CM | POA: Insufficient documentation

## 2014-05-27 DIAGNOSIS — M7989 Other specified soft tissue disorders: Secondary | ICD-10-CM | POA: Insufficient documentation

## 2014-05-27 DIAGNOSIS — Z87891 Personal history of nicotine dependence: Secondary | ICD-10-CM | POA: Insufficient documentation

## 2014-05-27 DIAGNOSIS — Z8739 Personal history of other diseases of the musculoskeletal system and connective tissue: Secondary | ICD-10-CM | POA: Insufficient documentation

## 2014-05-27 DIAGNOSIS — Z791 Long term (current) use of non-steroidal anti-inflammatories (NSAID): Secondary | ICD-10-CM | POA: Insufficient documentation

## 2014-05-27 DIAGNOSIS — I10 Essential (primary) hypertension: Secondary | ICD-10-CM | POA: Insufficient documentation

## 2014-05-27 NOTE — Discharge Instructions (Signed)
Please read and follow all provided instructions.  Your diagnoses today include:  1. Pain of right lower extremity     Tests performed today include:  Ultrasound of leg - does not show blood clot as cause of pain and swelling  Vital signs. See below for your results today.   Medications prescribed:   None  Take any prescribed medications only as directed.  Additional Information:  Follow any educational materials contained in this packet.  Follow-up instructions: Please follow-up with your primary care provider in the next 1-2 days for further evaluation of your symptoms.   Return instructions:   Please return to the Emergency Department if you experience worsening symptoms.  Return immediately if you develop chest pain, shortness of breath, dizziness or fainting.  Return with new color change or weakness/numbness to your affected leg(s).  Return with bleeding, severe headache, confusion or altered mental status.  Please return if you have any other emergent concerns.  Additional Information:  Your vital signs today were: BP 137/75   Pulse 97   Temp(Src) 98.1 F (36.7 C)   Resp 18   SpO2 98% If your blood pressure (BP) was elevated above 135/85 this visit, please have this repeated by your doctor within one month. --------------

## 2014-05-27 NOTE — Progress Notes (Signed)
VASCULAR LAB PRELIMINARY  PRELIMINARY  PRELIMINARY  PRELIMINARY  Right lower extremity venous Doppler completed.    Preliminary report:  There is no DVT or SVT noted in the right lower extremity.  Riko Lumsden, RVT 05/27/2014, 5:29 PM

## 2014-05-27 NOTE — ED Provider Notes (Signed)
CSN: [id number]     Arrival date & time 05/27/14  1508 History   First MD Initiated Contact with Patient 05/27/14 1610     Chief Complaint  Patient presents with  . Leg Swelling  . Leg Pain     (Consider location/radiation/quality/duration/timing/severity/associated sxs/prior Treatment) HPI Comments: Patients with history of right knee arthroscopy performed 2.5 weeks ago -- presents with leg pain, tenderness, and swelling which began 3 days ago. Patient saw her primary care physician walk-in clinic today and was sent to the emergency department for lower extremity Doppler. Patient states that at the onset, the back of her right ankle and right thigh was swollen and tender. This pain and swelling has gradually improved however still exists in the right thigh and right groin. Patient has well-healing scars in the area of her right knee. Patient denies chest pain or shortness of breath. She does not have a history of a previous blood clot. She's not on any blood thinners. Patient has been using naproxen at home for pain. 3 nights ago she did take 2 Dilaudid tablets which she had left over after her surgery.   Patient is a 58 y.o. female presenting with leg pain. The history is provided by the patient.  Leg Pain Associated symptoms: no fever     Past Medical History  Diagnosis Date  . Arthritis   . Hypertension    Past Surgical History  Procedure Laterality Date  . Esophageal manometry [name] 11/21/2013    Procedure: ESOPHAGEAL MANOMETRY (EM);  Surgeon: Shirley FriarVincent C. Schooler, MD;  Location: [facility] ENDOSCOPY;  Service: Endoscopy;  Laterality: [name];  . Breast surgery    . Abdominal hysterectomy    . Knee arthroscopy w/ meniscal repair    . Bunionectomy    . Nasal sinus surgery    . Lumbar laminectomy     No family history on file. History  Substance Use Topics  . Smoking status: Former Games developermoker  . Smokeless tobacco: Not on file  . Alcohol Use: Yes     Comment: occasionally   OB History   Grav  Para Term Preterm Abortions TAB SAB Ect Mult Living                 Review of Systems  Constitutional: Negative for fever.  HENT: Negative for rhinorrhea and sore throat.   Eyes: Negative for redness.  Respiratory: Negative for cough and shortness of breath.   Cardiovascular: Positive for leg swelling. Negative for chest pain.  Gastrointestinal: Negative for nausea, vomiting, abdominal pain and diarrhea.  Genitourinary: Negative for dysuria.  Musculoskeletal: Positive for myalgias.  Skin: Negative for rash.  Neurological: Negative for headaches.      Allergies  Aspirin; Oxycodone; Sulfa antibiotics; and Tramadol  Home Medications   Prior to Admission medications   Medication Sig Start Date End Date Taking? Authorizing Provider  albuterol (PROVENTIL HFA;VENTOLIN HFA) 108 (90 BASE) MCG/ACT inhaler Inhale into the lungs every 6 (six) hours as needed for wheezing or shortness of breath.    Historical Provider, MD  baclofen (LIORESAL) 10 MG tablet  10/31/13   Historical Provider, MD  fluticasone (FLONASE) 50 MCG/ACT nasal spray Place into both nostrils daily.    Historical Provider, MD  hydrochlorothiazide (HYDRODIURIL) 25 MG tablet Take 25 mg by mouth daily.  11/21/13   Historical Provider, MD  naproxen (NAPROSYN) 500 MG tablet  11/20/13   Historical Provider, MD  nortriptyline (PAMELOR) 25 MG capsule  11/11/13   Historical Provider, MD  omeprazole (PRILOSEC)  40 MG capsule  10/31/13   Historical Provider, MD   BP 137/75  Pulse 97  Temp(Src) 98.1 F (36.7 C)  Resp 18  SpO2 98%  Physical Exam  Nursing note and vitals reviewed. Constitutional: She appears well-developed and well-nourished.  HENT:  Head: Normocephalic and atraumatic.  Eyes: Conjunctivae are normal. Right eye exhibits no discharge. Left eye exhibits no discharge.  Neck: Normal range of motion. Neck supple.  Cardiovascular: Normal rate, regular rhythm and normal heart sounds.   Pulses:      Dorsalis pedis pulses  are 2+ on the right side, and 2+ on the left side.  Pulmonary/Chest: Effort normal and breath sounds normal.  Abdominal: Soft. There is no tenderness.  Musculoskeletal: She exhibits tenderness (mild, R thigh).  Healing surgical scars R knee with associated ecchymosis. Good ROM knee post-op. No calf tenderness.   Neurological: She is alert.  Skin: Skin is warm and dry.  Psychiatric: She has a normal mood and affect.    ED Course  Procedures (including critical care time) Labs Review Labs Reviewed - No data to display  Imaging Review No results found.   EKG Interpretation None      4:22 PM Patient seen and examined. Work-up initiated. Medications ordered.   Vital signs reviewed and are as follows: Filed Vitals:   05/27/14 1546  BP: 137/75  Pulse: 97  Temp: 98.1 F (36.7 C)  Resp: 18   5:52 PM lower extremity Doppler performed and is negative. Patient will continue NSAIDs and strong her pain medicine as needed. Encouraged patient to elevate leg as much as possible and continue with her exercises as directed. Encouraged to return with worsening swelling or pain, coolness or changing color of extremity. Patient verbalizes understanding and agrees with plan.   MDM   Final diagnoses:  Pain of right lower extremity   Postoperative pain and swelling. Ultrasound performed and is negative for DVT. Continue treatment as planned. No concern for vascular compromise.   Renne Crigler, PA-C 05/27/14 1754

## 2014-05-27 NOTE — ED Notes (Signed)
US at bedside

## 2014-05-27 NOTE — ED Notes (Signed)
Bed: WA16 Expected date:  Expected time:  Means of arrival:  Comments: Hold for triage 

## 2014-05-27 NOTE — ED Notes (Signed)
Pt A+Ox4, presents with c/o RLE pain and swelling x3 days.  Pt reports arthroscopy to R knee x2.5 weeks ago and sent by PCP to r/o DVT.  Pt reports pain and swelling has actually improved over the last 3 days.  4/10 pain currently to R thigh/groin area.  Skin PWD with bruising noted to R knee surgical areas.  MAEI, ambulatory with slow steady gait.  Pt denies CP/SOB, speaking full/clear sentences, rr even/un-lab.  NAD.

## 2014-05-29 NOTE — ED Provider Notes (Signed)
Medical screening examination/treatment/procedure(s) were conducted as a shared visit with non-physician practitioner(s) and myself.  I personally evaluated the patient during the encounter.  Pt s/p recent arthroscopy, c/o leg and ankle swelling. Mild swelling on exam. No cellulitis. Distal pulses palp. Vascular doppler ordered by PA.    Suzi RootsKevin E Jonte Shiller, MD 05/29/14 [phone]

## 2014-06-29 ENCOUNTER — Encounter: Payer: 59 | Attending: Family Medicine | Admitting: *Deleted

## 2014-06-29 ENCOUNTER — Encounter: Payer: Self-pay | Admitting: *Deleted

## 2014-06-29 DIAGNOSIS — Z713 Dietary counseling and surveillance: Secondary | ICD-10-CM | POA: Diagnosis present

## 2014-06-29 DIAGNOSIS — Z6830 Body mass index (BMI) 30.0-30.9, adult: Secondary | ICD-10-CM | POA: Insufficient documentation

## 2014-06-29 DIAGNOSIS — E669 Obesity, unspecified: Secondary | ICD-10-CM | POA: Diagnosis present

## 2014-06-29 NOTE — Patient Instructions (Addendum)
Aim for 3 carb choices/meal = 45 g/meal Aim for 1-2 carb choices/snack = 15-30 Increase protein and vegetables to stay full and nourished.  Always have protein with snack  Consider water aerobics Talk with ob/gyn about testing for PCOS

## 2014-06-29 NOTE — Progress Notes (Signed)
Medical Nutrition Therapy:  Appt start time: 0930 end time:  1030.  Assessment:  Primary concerns today: Ashley [name] is here for nutrition counseling pertaining to weight loss.  She has been diagnosed with reflux- Induced respiratory disease.  She knows she needs to lose weight to improve that.  She also had knee surgery in July of this year so she can't move at well.  She states her blood pressure is elevated and her glucose is elevated also.   Outside of pregnancy, her highest weight as an adult is 218 lb and her lowest weight is 150 lb. Her normal is 200-205 lb and she would like to weigh 165-170.   Royce does the grocery shopping and the cooking at home.  She very seldom eats out.  She typically bakes and broils her foods.  She stir-fries sometimes and sometimes she pan-sears meat.  She limits carbohydrates and when she does eat starches, she typically eats whole grains and uses oils in her cooking instead of butter or margarine.  She tries to follow a gluten-free diet, but she has misinformation about gluten and its health implication.  She's also lactose intolerant.   For a few weeks this summer she did skip lunch when she was home recovering from her surgery, but normally she doesn't skip meals.   She has lost weight in the past by exercising and following strict diet. She states she started gaining weight in 2013, but more when she injured her knee in 2014 and she hasn't been able to exercise.  She complains of hirsuitism and cystic ovaries.  She is menopausal, but when she was menstruating, her cycles were regular, but very painful.  These are all symptoms that could be indicative of PCOS  Preferred Learning Style:   No preference indicated   Learning Readiness:   Ready  MEDICATIONS: see list   DIETARY INTAKE:  Usual eating pattern includes 3 meals and 2-3 snacks per day.  Everyday foods include poultry, fish, starches, fruits and vegeatbles.  Avoided foods include red meat or pork,  says she doesn't eat much bread or starches  24-hr recall:  B ( AM): whole wheat bagel with whipped cream cheese, 2 slices [place] bacon.  Might have 2 scrambled eggs or over-medium with hot tea with stevia and lemon .  If her reflux is acting up she has low sugar oatmeal (2 packs) with banana Snk ( AM): fruit with peanut butter L ( PM): leftovers from dinner or [place] sandwich on rye or pumpernickel bread with vegetables with dressing (balsamic).  Might have a few slightly salted potato chips Snk ( PM): fruit or granola with nuts and seeds and dried fruit D ( PM): quesadilla on wheat tortilla with grilled chicken and peppers and onions with [place] cheese blend and side salad with lots of vegetables; grilled chicken or grilled fish and fresh vegetables Snk ( PM): fruit pops or will make smoothie with almond milk and banana or just fruit.  Will have a dessert once a week Beverages: water, 1 large cup hot tea, maybe 100% fruit about 6 oz.  No alcohol  Usual physical activity: none currently  Estimated energy needs: 1800 calories 200 g carbohydrates 135 g protein 50 g fat    Nutritional Diagnosis:  NB-2.1 Physical inactivity As related to knee surgery.  As evidenced by patient self-report and subsequent weight gain.    Intervention:  Nutrition counseling provided.  Discussed possibility of PCOS and referred to medical provider for additional testing.  Recommended  reduced-carbohydrate diet and provided education on carb counting.  Recommended increasing physical activity, as able - recommended water aerobics.  Goals: Aim for 3 carb choices/meal = 45 g/meal Aim for 1-2 carb choices/snack = 15-30 Increase protein and vegetables to stay full and nourished.  Always have protein with snack  Consider water aerobics Talk with ob/gyn about testing for PCOS  Teaching Method Utilized: Visual Auditory   Barriers to learning/adherence to lifestyle change: physical limitations  Demonstrated  degree of understanding via:  Teach Back   Monitoring/Evaluation:  Dietary intake, exercise, and body weight prn.

## 2014-09-04 ENCOUNTER — Ambulatory Visit (INDEPENDENT_AMBULATORY_CARE_PROVIDER_SITE_OTHER): Payer: 59 | Admitting: Neurology

## 2014-09-04 ENCOUNTER — Encounter: Payer: Self-pay | Admitting: Neurology

## 2014-09-04 VITALS — BP 115/75 | HR 93 | Ht 70.0 in | Wt 211.4 lb

## 2014-09-04 DIAGNOSIS — M5416 Radiculopathy, lumbar region: Secondary | ICD-10-CM | POA: Insufficient documentation

## 2014-09-04 DIAGNOSIS — M797 Fibromyalgia: Secondary | ICD-10-CM

## 2014-09-04 HISTORY — DX: Radiculopathy, lumbar region: M54.16

## 2014-09-04 HISTORY — DX: Fibromyalgia: M79.7

## 2014-09-04 NOTE — Progress Notes (Signed)
Reason for visit: Back pain  Ashley [name] is a 58 y.o. female  History of present illness:  Ms. Ashley [name] is a 58 year old right-handed black female with a history of low back pain. The patient has had prior lumbosacral spine surgery in the past, but in July 2015, the patient underwent arthroscopic knee surgery on the right knee, and shortly thereafter began having back pain radiating down the right leg. The patient indicates that the pain goes down the lateral aspect of the right thigh, and into the gastrocnemius muscle, and then across the top of the foot, so she was some numbness and tingly sensations in this distribution. She believes that the right leg is weaker. She denies any falls, but she has had some stumbles. She does have a chronic issue with control of the bowels and bladder, but this is not a new problem for her. She does have a history of fibromyalgia. She reports some neck discomfort and left shoulder discomfort. She is on gabapentin for her fibromyalgia pain. She was seen by Dr. Newell CoralNudelman, and MRI evaluation of the lumbosacral spine was done, and apparently did not show evidence of nerve root impingement on the right. This study is not available for my review. She is sent to this office for an evaluation. She is currently in physical therapy for her knee and for her back, and this has offered some benefit.  Past Medical History  Diagnosis Date  . Arthritis   . Hypertension   . Lumbar radicular pain 09/04/2014  . Fibromyalgia 09/04/2014    Past Surgical History  Procedure Laterality Date  . Esophageal manometry [name] 11/21/2013    Procedure: ESOPHAGEAL MANOMETRY (EM);  Surgeon: Shirley FriarVincent C. Schooler, MD;  Location: [facility];  Service: Endoscopy;  Laterality: [name];  . Breast surgery    . Abdominal hysterectomy    . Knee arthroscopy w/ meniscal repair    . Bunionectomy    . Nasal sinus surgery    . Lumbar laminectomy      Family History  Problem Relation Age  of Onset  . Cancer Other   . Hypertension Other   . Diabetes Other   . Aneurysm Father   . Bipolar disorder Brother   . Diabetes Brother     Social history:  reports that she has quit smoking. She does not have any smokeless tobacco history on file. She reports that she drinks alcohol. She reports that she does not use illicit drugs.  Medications:  Current Outpatient Prescriptions on File Prior to Visit  Medication Sig Dispense Refill  . acetaminophen (TYLENOL ARTHRITIS PAIN) 650 MG CR tablet Take 1,300 mg by mouth every 8 (eight) hours as needed for pain.    Marland Kitchen. albuterol (PROVENTIL HFA;VENTOLIN HFA) 108 (90 BASE) MCG/ACT inhaler Inhale into the lungs every 6 (six) hours as needed for wheezing or shortness of breath.    Marland Kitchen. azelastine (OPTIVAR) 0.05 % ophthalmic solution Place 1 drop into both eyes 2 (two) times daily.    . baclofen (LIORESAL) 10 MG tablet Take 10 mg by mouth 2 (two) times daily with a meal.     . beclomethasone (QVAR) 80 MCG/ACT inhaler Inhale 1-2 puffs into the lungs 2 (two) times daily.    Marland Kitchen. desloratadine (CLARINEX) 5 MG tablet Take 5 mg by mouth daily.    . fluticasone (FLONASE) 50 MCG/ACT nasal spray Place 2 sprays into both nostrils daily.     Marland Kitchen. gabapentin (NEURONTIN) 800 MG tablet Take 800 mg by mouth 3 (  three) times daily.    . hydrochlorothiazide (HYDRODIURIL) 25 MG tablet Take 25 mg by mouth daily.     Marland Kitchen HYDROcodone-acetaminophen (VICODIN) 5-500 MG per tablet Take 1 tablet by mouth every 6 (six) hours as needed for pain.    Marland Kitchen HYDROmorphone (DILAUDID) 2 MG tablet Take 1-2 mg by mouth every 4 (four) hours as needed for moderate pain or severe pain.    . naproxen (NAPROSYN) 500 MG tablet Take 500 mg by mouth 2 (two) times daily.     . nortriptyline (PAMELOR) 25 MG capsule Take 25 mg by mouth at bedtime.     Marland Kitchen omeprazole (PRILOSEC) 40 MG capsule Take 40 mg by mouth 2 (two) times daily.     . ondansetron (ZOFRAN-ODT) 4 MG disintegrating tablet Take 4 mg by mouth every 8  (eight) hours as needed for nausea or vomiting.    Marland Kitchen PARoxetine Mesylate (BRISDELLE) 7.5 MG CAPS Take 7.5 capsules by mouth at bedtime.    Marland Kitchen PRESCRIPTION MEDICATION Place 2 sprays into both nostrils daily.     No current facility-administered medications on file prior to visit.      Allergies  Allergen Reactions  . Aspirin   . Oxycodone Itching    "all over"  . Sulfa Antibiotics   . Tramadol     ROS:  Out of a complete 14 system review of symptoms, the patient complains only of the following symptoms, and all other reviewed systems are negative.  Fatigue Shortness of breath Easy bruising Achy muscles Allergies, runny nose Confusion, headache Not enough sleep, decreased energy Restless legs  Blood pressure 115/75, pulse 93, height 5\' 10"  (1.778 m), weight 211 lb 6.4 oz (95.89 kg).  Physical Exam  General: The patient is alert and cooperative at the time of the examination.  Eyes: Pupils are equal, round, and reactive to light. Discs are flat bilaterally.  Neck: The neck is supple, no carotid bruits are noted.  Respiratory: The respiratory examination is clear.  Cardiovascular: The cardiovascular examination reveals a regular rate and rhythm, no obvious murmurs or rubs are noted.  Neuromuscular: The patient has good range of movement of the lumbosacral spine. There appears be some pelvic tilt to the left. The patient has no significant discomfort with palpation of the low back or over the SI joints on either side.  Skin: Extremities are without significant edema.  Neurologic Exam  Mental status: The patient is alert and oriented x 3 at the time of the examination. The patient has apparent normal recent and remote memory, with an apparently normal attention span and concentration ability.  Cranial nerves: Facial symmetry is present. There is good sensation of the face to pinprick and soft touch bilaterally. The strength of the facial muscles and the muscles to head  turning and shoulder shrug are normal bilaterally. Speech is well enunciated, no aphasia or dysarthria is noted. Extraocular movements are full. Visual fields are full. The tongue is midline, and the patient has symmetric elevation of the soft palate. No obvious hearing deficits are noted.  Motor: The motor testing reveals 5 over 5 strength of all 4 extremities. Good symmetric motor tone is noted throughout. The patient is able to walk on heels and the toes bilaterally.  Sensory: Sensory testing is intact to pinprick, soft touch, vibration sensation, and position sense on all 4 extremities. No evidence of extinction is noted.  Coordination: Cerebellar testing reveals good finger-nose-finger and heel-to-shin bilaterally.  Gait and station: Gait is normal. Tandem gait is  normal. Romberg is negative. No drift is seen.  Reflexes: Deep tendon reflexes are symmetric, but are depressed bilaterally. Toes are downgoing bilaterally.    Lower extremity doppler 05/27/14:  VASCULAR LAB PRELIMINARY PRELIMINARY PRELIMINARY PRELIMINARY  Right lower extremity venous Doppler completed.   Preliminary report: There is no DVT or SVT noted in the right lower extremity.   Assessment/Plan:  1. Low back pain, right leg discomfort  The patient has a relatively unremarkable clinical examination, but she is having sciatica type pain down the right leg without evidence of nerve root compression from a prior lumbosacral MRI evaluation. The patient will be set up for nerve conduction studies of both legs, EMG of the right leg. She will follow-up for the evaluation. We will try to get the report of the MRI evaluation done previously.  Marlan Palau. Keith Kendrix Orman MD 09/04/2014 11:07 AM  [facility] [address] [place], [place] [id number]  Phone [phone] Fax [phone]

## 2014-09-04 NOTE — Patient Instructions (Signed)
Back Pain, Adult °Back pain is very common. The pain often gets better over time. The cause of back pain is usually not dangerous. Most people can learn to manage their back pain on their own.  °HOME CARE  °· Stay active. Start with short walks on flat ground if you can. Try to walk farther each day. °· Do not sit, drive, or stand in one place for more than 30 minutes. Do not stay in bed. °· Do not avoid exercise or work. Activity can help your back heal faster. °· Be careful when you bend or lift an object. Bend at your knees, keep the object close to you, and do not twist. °· Sleep on a firm mattress. Lie on your side, and bend your knees. If you lie on your back, put a pillow under your knees. °· Only take medicines as told by your doctor. °· Put ice on the injured area. °¨ Put ice in a plastic bag. °¨ Place a towel between your skin and the bag. °¨ Leave the ice on for 15-20 minutes, 03-04 times a day for the first 2 to 3 days. After that, you can switch between ice and heat packs. °· Ask your doctor about back exercises or massage. °· Avoid feeling anxious or stressed. Find good ways to deal with stress, such as exercise. °GET HELP RIGHT AWAY IF:  °· Your pain does not go away with rest or medicine. °· Your pain does not go away in 1 week. °· You have new problems. °· You do not feel well. °· The pain spreads into your legs. °· You cannot control when you poop (bowel movement) or pee (urinate). °· Your arms or legs feel weak or lose feeling (numbness). °· You feel sick to your stomach (nauseous) or throw up (vomit). °· You have belly (abdominal) pain. °· You feel like you may pass out (faint). °MAKE SURE YOU:  °· Understand these instructions. °· Will watch your condition. °· Will get help right away if you are not doing well or get worse. °Document Released: 04/07/2008 Document Revised: 01/12/2012 Document Reviewed: 02/21/2014 °Exit[employer]® Patient Information ©2015 [employer]. This information is not intended  to replace advice given to you by your health care provider. Make sure you discuss any questions you have with your health care provider. ° °

## 2014-09-05 ENCOUNTER — Telehealth: Payer: Self-pay | Admitting: Neurology

## 2014-09-05 NOTE — Telephone Encounter (Signed)
I have received the results of the MRI of the lumbar spine showing scoliosis, and degenerative changes at the L4-5 and L5-S1 level with prior surgery on the left side no significant spinal stenosis or neuroforaminal stenosis was seen.

## 2014-09-06 ENCOUNTER — Encounter: Payer: Self-pay | Admitting: Emergency Medicine

## 2014-09-06 ENCOUNTER — Ambulatory Visit (INDEPENDENT_AMBULATORY_CARE_PROVIDER_SITE_OTHER): Payer: 59 | Admitting: Emergency Medicine

## 2014-09-06 VITALS — BP 126/80 | HR 88 | Temp 97.1°F | Ht 70.0 in | Wt 214.8 lb

## 2014-09-06 DIAGNOSIS — J309 Allergic rhinitis, unspecified: Secondary | ICD-10-CM

## 2014-09-06 DIAGNOSIS — K219 Gastro-esophageal reflux disease without esophagitis: Secondary | ICD-10-CM

## 2014-09-06 DIAGNOSIS — I10 Essential (primary) hypertension: Secondary | ICD-10-CM

## 2014-09-06 DIAGNOSIS — J45909 Unspecified asthma, uncomplicated: Secondary | ICD-10-CM | POA: Insufficient documentation

## 2014-09-06 DIAGNOSIS — J4520 Mild intermittent asthma, uncomplicated: Secondary | ICD-10-CM

## 2014-09-06 NOTE — Assessment & Plan Note (Signed)
-   on a good regimen for asthma, GERD and allergies; need to change the QVAR to whatever preferred for her insurance - albuterol prn - obtain spirometry from Dr Willa RoughHicks - continue rx for GERD and allergic rhinitis - rov 3

## 2014-09-06 NOTE — Assessment & Plan Note (Signed)
desloratdine + nasal steroid + NSW

## 2014-09-06 NOTE — Assessment & Plan Note (Signed)
-   on omeprazole 40mg  bid

## 2014-09-06 NOTE — Progress Notes (Signed)
h Subjective:    Patient ID: Ashley [name], female    DOB: July 31, 1956, 58 y.o.   MRN: [id number]  HPI 58 yo woman, former smoker (20 pk-yrs), hx HTN, Allergies, GERD. Has been given dx of asthma associated with her GERD. She describes cough that bothers her in the am and brings up mucous, shortness of breath with exertion. She used to hear wheeze, but this has improved since QVAR and omeprazole. She is on [place], desloratadine, fluticasone nasal. Has been seen by Dr Willa RoughHicks in Allergy and had spirometry. Was started on QVAR and her breathing has improved. She does not wheeze now. She has some triggers > leaves, fresh cut grass, smoke. She uses SABA rarely - both times after she was singing. She has gained some wt > 25-30 lbs over 2 yrs.    Review of Systems  Constitutional: Negative for fever and unexpected weight change.  HENT: Negative for congestion, dental problem, ear pain, nosebleeds, postnasal drip, rhinorrhea, sinus pressure, sneezing, sore throat and trouble swallowing.   Eyes: Negative for redness and itching.  Respiratory: Positive for cough and shortness of breath. Negative for choking, chest tightness and wheezing.   Cardiovascular: Negative for palpitations and leg swelling.  Gastrointestinal: Negative for nausea and vomiting.  Genitourinary: Negative for dysuria.  Musculoskeletal: Negative for joint swelling.  Skin: Negative for rash.  Neurological: Positive for headaches.  Hematological: Does not bruise/bleed easily.  Psychiatric/Behavioral: Negative for dysphoric mood. The patient is not nervous/anxious.     Past Medical History  Diagnosis Date  . Arthritis   . Hypertension   . Lumbar radicular pain 09/04/2014  . Fibromyalgia 09/04/2014     Family History  Problem Relation Age of Onset  . Cancer Other   . Hypertension Other   . Diabetes Other   . Aneurysm Father   . Bipolar disorder Brother   . Diabetes Brother      History   Social History  . Marital  Status: Married    Spouse Name: [name]    Number of Children: [name]  . Years of Education: [name]   Occupational History  . Not on file.   Social History Main Topics  . Smoking status: Former Smoker -- 0.75 packs/day for 24 years    Types: Cigarettes    Quit date: 11/03/1992  . Smokeless tobacco: Not on file  . Alcohol Use: Yes     Comment: occasionally  . Drug Use: No  . Sexual Activity: Not on file   Other Topics Concern  . Not on file   Social History Narrative     Allergies  Allergen Reactions  . Aspirin   . Latex Itching  . Oxycodone Itching    "all over"  . Sulfa Antibiotics   . Tramadol      Outpatient Prescriptions Prior to Visit  Medication Sig Dispense Refill  . acetaminophen (TYLENOL ARTHRITIS PAIN) 650 MG CR tablet Take 1,300 mg by mouth every 8 (eight) hours as needed for pain.    Marland Kitchen. albuterol (PROVENTIL HFA;VENTOLIN HFA) 108 (90 BASE) MCG/ACT inhaler Inhale into the lungs every 6 (six) hours as needed for wheezing or shortness of breath.    Marland Kitchen. azelastine (OPTIVAR) 0.05 % ophthalmic solution Place 1 drop into both eyes 2 (two) times daily.    . baclofen (LIORESAL) 10 MG tablet Take 10 mg by mouth 2 (two) times daily with a meal.     . beclomethasone (QVAR) 80 MCG/ACT inhaler Inhale 1-2 puffs into the lungs 2 (two)  times daily.    Marland Kitchen. desloratadine (CLARINEX) 5 MG tablet Take 5 mg by mouth daily.    . fluticasone (FLONASE) 50 MCG/ACT nasal spray Place 2 sprays into both nostrils daily.     Marland Kitchen. gabapentin (NEURONTIN) 800 MG tablet Take 800 mg by mouth 3 (three) times daily.    . hydrochlorothiazide (HYDRODIURIL) 25 MG tablet Take 25 mg by mouth daily.     Marland Kitchen. HYDROcodone-acetaminophen (VICODIN) 5-500 MG per tablet Take 1 tablet by mouth every 6 (six) hours as needed for pain.    Marland Kitchen. HYDROmorphone (DILAUDID) 2 MG tablet Take 1-2 mg by mouth every 4 (four) hours as needed for moderate pain or severe pain.    . naproxen (NAPROSYN) 500 MG tablet Take 500 mg by mouth 2 (two) times  daily.     . nortriptyline (PAMELOR) 25 MG capsule Take 25 mg by mouth at bedtime.     Marland Kitchen. omeprazole (PRILOSEC) 40 MG capsule Take 40 mg by mouth 2 (two) times daily.     . ondansetron (ZOFRAN-ODT) 4 MG disintegrating tablet Take 4 mg by mouth every 8 (eight) hours as needed for nausea or vomiting.    Marland Kitchen. PARoxetine Mesylate (BRISDELLE) 7.5 MG CAPS Take 7.5 capsules by mouth at bedtime.    Marland Kitchen. PRESCRIPTION MEDICATION Place 2 sprays into both nostrils daily.     No facility-administered medications prior to visit.        Objective:   Physical Exam Filed Vitals:   09/06/14 0920  BP: 126/80  Pulse: 88  Temp: 97.1 F (36.2 C)  TempSrc: Oral  Height: 5\' 10"  (1.778 m)  Weight: 214 lb 12.8 oz (97.433 kg)  SpO2: 98%   Gen: Pleasant, well-nourished, in no distress,  normal affect  ENT: No lesions,  mouth clear,  oropharynx clear, no postnasal drip  Neck: No JVD, no TMG, no carotid bruits  Lungs: No use of accessory muscles, no dullness to percussion, clear without rales or rhonchi  Cardiovascular: RRR, heart sounds normal, no murmur or gallops, no peripheral edema  Musculoskeletal: No deformities, no cyanosis or clubbing  Neuro: alert, non focal  Skin: Warm, no lesions or rashes      Assessment & Plan:  Intrinsic asthma - on a good regimen for asthma, GERD and allergies; need to change the QVAR to whatever preferred for her insurance - albuterol prn - obtain spirometry from Dr Willa RoughHicks - continue rx for GERD and allergic rhinitis - rov 3   Allergic rhinitis desloratdine + nasal steroid + [place]   GERD (gastroesophageal reflux disease) - on omeprazole 40mg  bid

## 2014-09-06 NOTE — Patient Instructions (Signed)
We will work to change to an alternative to [employer]. Possibilities > pulmicort, asmanex, flovent, budesonide, fluticasone. Call our office if you find any information on this.  Continue your other meds as you have been taking them  We will obtain your spirometry from Dr Willa RoughHicks.  Follow with Dr Delton CoombesByrum in 3 months or sooner if you have any problems.

## 2014-09-12 ENCOUNTER — Ambulatory Visit (INDEPENDENT_AMBULATORY_CARE_PROVIDER_SITE_OTHER): Payer: 59 | Admitting: Neurology

## 2014-09-12 ENCOUNTER — Encounter (INDEPENDENT_AMBULATORY_CARE_PROVIDER_SITE_OTHER): Payer: Self-pay

## 2014-09-12 DIAGNOSIS — M797 Fibromyalgia: Secondary | ICD-10-CM

## 2014-09-12 DIAGNOSIS — M5416 Radiculopathy, lumbar region: Secondary | ICD-10-CM

## 2014-09-12 DIAGNOSIS — Z0289 Encounter for other administrative examinations: Secondary | ICD-10-CM

## 2014-09-12 NOTE — Procedures (Signed)
     HISTORY:  [name] is a 58 year old patient with a history of prior lumbosacral spine surgery. The patient recently had knee surgery on the right, and she developed sciatica type pain down the right leg to the foot. The patient has had a recent MRI of the lumbosacral spine that did not show nerve root compression. She is being evaluated for the right leg discomfort.  NERVE CONDUCTION STUDIES:  Nerve conduction studies were performed on both lower extremities. The distal motor latencies and motor amplitudes for the peroneal and posterior tibial nerves were within normal limits. The nerve conduction velocities for these nerves were also normal. The H reflex latencies were normal. The sensory latencies for the peroneal nerves were within normal limits.   EMG STUDIES:  EMG study was performed on the right lower extremity:  The tibialis anterior muscle reveals 2 to 4K motor units with full recruitment. No fibrillations or positive waves were seen. The peroneus tertius muscle reveals 2 to 4K motor units with full recruitment. No fibrillations or positive waves were seen. The medial gastrocnemius muscle reveals 1 to 3K motor units with full recruitment. No fibrillations or positive waves were seen. The vastus lateralis muscle reveals 2 to 4K motor units with full recruitment. No fibrillations or positive waves were seen. The iliopsoas muscle reveals 2 to 4K motor units with full recruitment. No fibrillations or positive waves were seen. The biceps femoris muscle (long head) reveals 2 to 4K motor units with full recruitment. No fibrillations or positive waves were seen. The lumbosacral paraspinal muscles were tested at 3 levels, and revealed no abnormalities of insertional activity at all 3 levels tested. There was good relaxation.   IMPRESSION:  Nerve conduction studies done on both lower extremities were within normal limits. No evidence of a neuropathy is seen. EMG  evaluation of the right lower extremity is unremarkable, without evidence of an overlying lumbosacral radiculopathy.  Marlan Palau. Keith Jiovanny Burdell MD 09/12/2014 1:53 PM  [facility] [address] [place], [place] [id number]  Phone [phone] Fax [phone]

## 2014-09-12 NOTE — Progress Notes (Signed)
[name] is a 58 year old patient with a history of lumbosacral spine surgery previously, now complaining of right lower extremity discomfort following the surgery. The patient comes in today for EMG and nerve conduction study evaluation.  Nerve conduction studies on both legs were normal, EMG of the right leg was normal.  Patient reports some discomfort down the right leg with pressure over the right SI joint. The patient will be set up for an SI joint injection to see if this improves her discomfort.

## 2014-09-13 ENCOUNTER — Other Ambulatory Visit: Payer: Self-pay | Admitting: Neurology

## 2014-09-13 DIAGNOSIS — M5416 Radiculopathy, lumbar region: Secondary | ICD-10-CM

## 2014-09-15 ENCOUNTER — Ambulatory Visit
Admission: RE | Admit: 2014-09-15 | Discharge: 2014-09-15 | Disposition: A | Discharge status: Home / Self Care | Payer: 59 | Source: Ambulatory Visit | Attending: Neurology | Admitting: Neurology

## 2014-09-15 DIAGNOSIS — M5416 Radiculopathy, lumbar region: Secondary | ICD-10-CM

## 2014-09-15 MED ORDER — DIAZEPAM 5 MG PO TABS
10.0000 mg | ORAL_TABLET | Freq: Once | ORAL | Status: AC
  Administered 2014-09-15: 10 mg via ORAL

## 2014-09-15 MED ORDER — IOHEXOL 180 MG/ML  SOLN
1.0000 mL | Freq: Once | INTRAMUSCULAR | Status: AC | PRN
Start: 2014-09-15 — End: 2014-09-15
  Administered 2014-09-15: 1 mL via INTRA_ARTICULAR

## 2014-09-15 MED ORDER — METHYLPREDNISOLONE ACETATE 40 MG/ML INJ SUSP (RADIOLOG
120.0000 mg | Freq: Once | INTRAMUSCULAR | Status: AC
  Administered 2014-09-15: 120 mg via INTRA_ARTICULAR

## 2014-09-15 NOTE — Discharge Instructions (Signed)

## 2014-11-03 DIAGNOSIS — G4730 Sleep apnea, unspecified: Secondary | ICD-10-CM

## 2014-11-03 HISTORY — DX: Sleep apnea, unspecified: G47.30

## 2014-12-20 ENCOUNTER — Telehealth: Payer: Self-pay | Admitting: Emergency Medicine

## 2014-12-20 NOTE — Telephone Encounter (Signed)
Pt has been scheduled to see TP tomorrow at 9:30am.

## 2014-12-21 ENCOUNTER — Ambulatory Visit (INDEPENDENT_AMBULATORY_CARE_PROVIDER_SITE_OTHER): Payer: 59 | Admitting: Adult Health

## 2014-12-21 ENCOUNTER — Encounter (INDEPENDENT_AMBULATORY_CARE_PROVIDER_SITE_OTHER): Payer: Self-pay

## 2014-12-21 ENCOUNTER — Encounter: Payer: Self-pay | Admitting: Adult Health

## 2014-12-21 VITALS — BP 128/86 | HR 86 | Temp 98.6°F | Ht 70.0 in | Wt 215.6 lb

## 2014-12-21 DIAGNOSIS — J309 Allergic rhinitis, unspecified: Secondary | ICD-10-CM

## 2014-12-21 DIAGNOSIS — J4521 Mild intermittent asthma with (acute) exacerbation: Secondary | ICD-10-CM

## 2014-12-21 MED ORDER — LEVALBUTEROL HCL 0.63 MG/3ML IN NEBU
0.6300 mg | INHALATION_SOLUTION | Freq: Once | RESPIRATORY_TRACT | Status: AC
  Administered 2014-12-21: 0.63 mg via RESPIRATORY_TRACT

## 2014-12-21 MED ORDER — BECLOMETHASONE DIPROPIONATE 80 MCG/ACT IN AERS: 2.0000 | INHALATION_SPRAY | Freq: Two times a day (BID) | RESPIRATORY_TRACT | Status: DC

## 2014-12-21 MED ORDER — BENZONATATE 200 MG PO CAPS: 200.0000 mg | ORAL_CAPSULE | Freq: Three times a day (TID) | ORAL | Status: DC | PRN

## 2014-12-21 MED ORDER — PREDNISONE 10 MG PO TABS: ORAL_TABLET | ORAL | Status: DC

## 2014-12-21 MED ORDER — BUDESONIDE 180 MCG/ACT IN AEPB: 2.0000 | INHALATION_SPRAY | Freq: Two times a day (BID) | RESPIRATORY_TRACT | Status: DC

## 2014-12-21 NOTE — Patient Instructions (Addendum)
Delsym 2 tsp Twice daily  For cough  Tessalon Three times a day  As needed  Cough  Add Pepcid 20 mg at bedtime GERD diet Add Chlor tabs 4 mg 2 tablets at bedtime for drainage Prednisone taper over the next week Begin Pulmicort 2 puffs Twice daily   Use sugarless candy and sips of water to avoid coughing and throat clearing No mints products Follow-up with Dr. Delton CoombesByrum in 4-6 weeks with pulmonary function test Please contact office for sooner follow up if symptoms do not improve or worsen or seek emergency care

## 2014-12-21 NOTE — Addendum Note (Signed)
Addended by: Charlott HollerKIDD, Maximilliano Kersh W on: 12/21/2014 10:38 AM   Modules accepted: Orders

## 2014-12-21 NOTE — Addendum Note (Signed)
Addended by: Boone MasterJONES, Magdiel Bartles E on: 12/21/2014 03:59 PM   Modules accepted: Orders

## 2014-12-21 NOTE — Assessment & Plan Note (Addendum)
Flare with upper airway cough aggravated by GERD/AR Needs PFT  xopenex neb x 1   Plan  Delsym 2 tsp Twice daily  For cough  Tessalon Three times a day  As needed  Cough  Add Pepcid 20 mg at bedtime GERD diet Add Chlor tabs 4 mg 2 tablets at bedtime for drainage Prednisone taper over the next week Begin Pulmicort 2 puffs Twice daily   Use sugarless candy and sips of water to avoid coughing and throat clearing No mints products Follow-up with Ashley [name] in 4-6 weeks with pulmonary function test Please contact office for sooner follow up if symptoms do not improve or worsen or seek emergency care

## 2014-12-21 NOTE — Progress Notes (Signed)
   Subjective:    Patient ID: Ashley [name], female    DOB: Feb 27, 1956, 59 y.o.   MRN: [id number]  HPI 59 yo woman, former smoker (20 pk-yrs), hx HTN, Allergies, GERD. Has been given dx of asthma associated with her GERD.  09/06/14 OV   She describes cough that bothers her in the am and brings up mucous, shortness of breath with exertion. She used to hear wheeze, but this has improved since QVAR and omeprazole. She is on [place], desloratadine, fluticasone nasal. Has been seen by Dr Willa RoughHicks in Allergy and had spirometry. Was started on QVAR and her breathing has improved. She does not wheeze now. She has some triggers > leaves, fresh cut grass, smoke. She uses SABA rarely - both times after she was singing. She has gained some wt > 25-30 lbs over 2 yrs.    12/21/2014 Acute OV :Asthma/GERD  Patient presents for an acute office visit She complains of RB pt here for dry cough with rare minimal yellow mucus, increased SOB, dry throat/hoarseness x1 week.  denies f/c/s, n/v/d, hemoptysis Cold air and talking making cough worse.  Severe coughing fits.  Using tessalon with some .  On FM , and DJD on chronic narcotics.  Not taking QVAR -not covered well by her insurance.  Believes Pulmicort is covered.  She denies any chest pain, orthopnea, PND, or fever, swelling     Review of Systems Constitutional:   No  weight loss, night sweats,  Fevers, chills,  +fatigue, or  lassitude.  HEENT:   No headaches,  Difficulty swallowing,  Tooth/dental problems, or  Sore throat,                No sneezing, itching, ear ache, nasal congestion,  +post nasal drip,   CV:  No chest pain,  Orthopnea, PND, swelling in lower extremities, anasarca, dizziness, palpitations, syncope.   GI  No heartburn, indigestion, abdominal pain, nausea, vomiting, diarrhea, change in bowel habits, loss of appetite, bloody stools.   Resp:  .  No chest wall deformity  Skin: no rash or lesions.  GU: no dysuria, change in color  of urine, no urgency or frequency.  No flank pain, no hematuria   MS:  No joint pain or swelling.  No decreased range of motion.  No back pain.  Psych:  No change in mood or affect. No depression or anxiety.  No memory loss.         Objective:   Physical Exam GEN: A/Ox3; pleasant , NAD  HEENT:  [place]/AT,  EACs-clear, TMs-wnl, NOSE-clear, THROAT-clear, no lesions  NECK:  Supple w/ fair ROM; no JVD; normal carotid impulses w/o bruits; no thyromegaly or nodules palpated; no lymphadenopathy.  RESP  Decreased BS in bases no accessory muscle use, no dullness to percussion, barking cough   CARD:  RRR, no m/r/g  , no peripheral edema, pulses intact, no cyanosis or clubbing.  GI:   Soft & nt; nml bowel sounds; no organomegaly or masses detected.  Musco: Warm bil, no deformities or joint swelling noted.   Neuro: alert, no focal deficits noted.    Skin: Warm, no lesions or rashes         Assessment & Plan:

## 2014-12-21 NOTE — Assessment & Plan Note (Signed)
Flare  Plan  Add Chlor tabs 4 mg 2 tablets at bedtime for drainage Prednisone taper over the next week Use sugarless candy and sips of water to avoid coughing and throat clearing No mints products Follow-up with Dr. Delton CoombesByrum in 4-6 weeks with pulmonary function test Please contact office for sooner follow up if symptoms do not improve or worsen or seek emergency care

## 2015-01-19 ENCOUNTER — Ambulatory Visit: Payer: 59 | Admitting: Emergency Medicine

## 2015-01-22 ENCOUNTER — Other Ambulatory Visit: Payer: Self-pay | Admitting: Orthopedic Surgery

## 2015-01-22 DIAGNOSIS — G8929 Other chronic pain: Secondary | ICD-10-CM

## 2015-01-22 DIAGNOSIS — M533 Sacrococcygeal disorders, not elsewhere classified: Principal | ICD-10-CM

## 2015-02-05 ENCOUNTER — Other Ambulatory Visit: Payer: Self-pay | Admitting: Emergency Medicine

## 2015-02-05 DIAGNOSIS — R0600 Dyspnea, unspecified: Secondary | ICD-10-CM

## 2015-02-06 ENCOUNTER — Ambulatory Visit (INDEPENDENT_AMBULATORY_CARE_PROVIDER_SITE_OTHER): Payer: 59 | Admitting: Emergency Medicine

## 2015-02-06 ENCOUNTER — Encounter: Payer: Self-pay | Admitting: Emergency Medicine

## 2015-02-06 VITALS — BP 132/90 | HR 88 | Ht 70.0 in | Wt 210.0 lb

## 2015-02-06 DIAGNOSIS — R0600 Dyspnea, unspecified: Secondary | ICD-10-CM | POA: Diagnosis not present

## 2015-02-06 DIAGNOSIS — K219 Gastro-esophageal reflux disease without esophagitis: Secondary | ICD-10-CM | POA: Diagnosis not present

## 2015-02-06 DIAGNOSIS — J309 Allergic rhinitis, unspecified: Secondary | ICD-10-CM | POA: Diagnosis not present

## 2015-02-06 DIAGNOSIS — J4521 Mild intermittent asthma with (acute) exacerbation: Secondary | ICD-10-CM | POA: Diagnosis not present

## 2015-02-06 LAB — PULMONARY FUNCTION TEST
DL/VA % pred: 91 %
DL/VA: 4.96 ml/min/mmHg/L
DLCO unc % pred: 66 %
DLCO unc: 21.41 ml/min/mmHg
FEF 25-75 Post: 3.01 L/sec
FEF 25-75 Pre: 3.55 L/sec
FEF2575-%Change-Post: -15 %
FEF2575-%Pred-Post: 114 %
FEF2575-%Pred-Pre: 134 %
FEV1-%Change-Post: -2 %
FEV1-%Pred-Post: 98 %
FEV1-%Pred-Pre: 100 %
FEV1-Post: 2.68 L
FEV1-Pre: 2.75 L
FEV1FVC-%Change-Post: 2 %
FEV1FVC-%Pred-Pre: 106 %
FEV6-%Change-Post: -5 %
FEV6-%Pred-Post: 91 %
FEV6-%Pred-Pre: 96 %
FEV6-Post: 3.06 L
FEV6-Pre: 3.24 L
FEV6FVC-%Pred-Post: 103 %
FEV6FVC-%Pred-Pre: 103 %
FVC-%Change-Post: -5 %
FVC-%Pred-Post: 88 %
FVC-%Pred-Pre: 94 %
FVC-Post: 3.06 L
FVC-Pre: 3.24 L
Post FEV1/FVC ratio: 88 %
Post FEV6/FVC ratio: 100 %
Pre FEV1/FVC ratio: 85 %
Pre FEV6/FVC Ratio: 100 %
RV % pred: 56 %
RV: 1.25 L
TLC % pred: 78 %
TLC: 4.67 L

## 2015-02-06 NOTE — Patient Instructions (Signed)
Please continue QVAR 2 puffs twice a day  Depending on how you are doing after the Spring, we could going to 2 puffs once a day  Keep albuterol available to use needed.  Try increasing fluticasone nasal spray to 2 sprays each side twice a day during the allergy season Continue your dexilant and clarinex as you are taking them  Follow with Dr Delton CoombesByrum in 6 months or sooner if you have any problems

## 2015-02-06 NOTE — Progress Notes (Signed)
h Subjective:    Patient ID: Ashley [name], female    DOB: 03/23/1956, 59 y.o.   MRN: [id number]  Asthma She complains of cough and shortness of breath. There is no wheezing. Associated symptoms include headaches. Pertinent negatives include no ear pain, fever, postnasal drip, rhinorrhea, sneezing, sore throat or trouble swallowing. Her past medical history is significant for asthma.   59 yo woman, former smoker (20 pk-yrs), hx HTN, Allergies, GERD. Has been given dx of asthma associated with her GERD. She describes cough that bothers her in the am and brings up mucous, shortness of breath with exertion. She used to hear wheeze, but this has improved since QVAR and omeprazole. She is on [place], desloratadine, fluticasone nasal. Has been seen by Dr Willa Rough in Allergy and had spirometry. Was started on QVAR and her breathing has improved. She does not wheeze now. She has some triggers > leaves, fresh cut grass, smoke. She uses SABA rarely - both times after she was singing. She has gained some wt > 25-30 lbs over 2 yrs.   ROV 02/06/15 -- follow up for her asthma. She was seen and treated in 2/'16 for an apparent flare with cough, following a URI. She had run out of QVAR at the time. She has benefited significantly from restarting the QVAR.  Rarely uses her SABA. She is on fluticasone qd, clarinex, dexilant. She had PFT today    Review of Systems  Constitutional: Negative for fever and unexpected weight change.  HENT: Negative for congestion, dental problem, ear pain, nosebleeds, postnasal drip, rhinorrhea, sinus pressure, sneezing, sore throat and trouble swallowing.   Eyes: Negative for redness and itching.  Respiratory: Positive for cough and shortness of breath. Negative for choking, chest tightness and wheezing.   Cardiovascular: Negative for palpitations and leg swelling.  Gastrointestinal: Negative for nausea and vomiting.  Genitourinary: Negative for dysuria.  Musculoskeletal: Negative  for joint swelling.  Skin: Negative for rash.  Neurological: Positive for headaches.  Hematological: Does not bruise/bleed easily.  Psychiatric/Behavioral: Negative for dysphoric mood. The patient is not nervous/anxious.     Past Medical History  Diagnosis Date  . Arthritis   . Hypertension   . Lumbar radicular pain 09/04/2014  . Fibromyalgia 09/04/2014     Family History  Problem Relation Age of Onset  . Cancer Other   . Hypertension Other   . Diabetes Other   . Aneurysm Father   . Bipolar disorder Brother   . Diabetes Brother      History   Social History  . Marital Status: Married    Spouse Name: [name]  . Number of Children: [name]  . Years of Education: [name]   Occupational History  . Not on file.   Social History Main Topics  . Smoking status: Former Smoker -- 0.75 packs/day for 24 years    Types: Cigarettes    Quit date: 11/03/1992  . Smokeless tobacco: Not on file  . Alcohol Use: Yes     Comment: occasionally  . Drug Use: No  . Sexual Activity: Not on file   Other Topics Concern  . Not on file   Social History Narrative     Allergies  Allergen Reactions  . Latex Itching, Swelling and Other (See Comments)    redness  . Sulfa Antibiotics Diarrhea and Nausea And Vomiting  . Aspirin Other (See Comments)    Can cause her to bleed out due to G6 PD  . Oxycodone Itching    "all over"  .  Tramadol Other (See Comments)    Makes her feel "spaced out on street drugs"     Outpatient Prescriptions Prior to Visit  Medication Sig Dispense Refill  . acetaminophen (TYLENOL ARTHRITIS PAIN) 650 MG CR tablet Take 1,300 mg by mouth every 8 (eight) hours as needed for pain.    Marland Kitchen albuterol (PROVENTIL HFA;VENTOLIN HFA) 108 (90 BASE) MCG/ACT inhaler Inhale into the lungs every 6 (six) hours as needed for wheezing or shortness of breath.    Marland Kitchen azelastine (OPTIVAR) 0.05 % ophthalmic solution Place 1 drop into both eyes 2 (two) times daily.    . baclofen (LIORESAL) 10 MG tablet  Take 10 mg by mouth 2 (two) times daily with a meal.     . beclomethasone (QVAR) 80 MCG/ACT inhaler Inhale 2 puffs into the lungs 2 (two) times daily. 1 Inhaler 6  . benzonatate (TESSALON) 200 MG capsule Take 1 capsule (200 mg total) by mouth 3 (three) times daily as needed for cough. 45 capsule 0  . desloratadine (CLARINEX) 5 MG tablet Take 5 mg by mouth daily.    . fluticasone (FLONASE) 50 MCG/ACT nasal spray Place 2 sprays into both nostrils daily.     Marland Kitchen gabapentin (NEURONTIN) 800 MG tablet Take 800 mg by mouth 3 (three) times daily.    . hydrochlorothiazide (HYDRODIURIL) 25 MG tablet Take 25 mg by mouth daily.     Marland Kitchen HYDROcodone-acetaminophen (VICODIN) 5-500 MG per tablet Take 1 tablet by mouth every 6 (six) hours as needed for pain.    Marland Kitchen HYDROmorphone (DILAUDID) 2 MG tablet Take 1-2 mg by mouth every 4 (four) hours as needed for moderate pain or severe pain.    . naproxen (NAPROSYN) 500 MG tablet Take 500 mg by mouth 2 (two) times daily.     . nortriptyline (PAMELOR) 25 MG capsule Take 25 mg by mouth at bedtime.     . ondansetron (ZOFRAN-ODT) 4 MG disintegrating tablet Take 4 mg by mouth every 8 (eight) hours as needed for nausea or vomiting.    Marland Kitchen PRESCRIPTION MEDICATION Place 2 sprays into both nostrils daily.    . beclomethasone (QVAR) 80 MCG/ACT inhaler Inhale 1-2 puffs into the lungs 2 (two) times daily.    . budesonide (PULMICORT FLEXHALER) 180 MCG/ACT inhaler Inhale 2 puffs into the lungs 2 (two) times daily. 1 Inhaler 5  . omeprazole (PRILOSEC) 40 MG capsule Take 40 mg by mouth 2 (two) times daily.     . predniSONE (DELTASONE) 10 MG tablet 4 tabs for 2 days, then 3 tabs for 2 days, 2 tabs for 2 days, then 1 tab for 2 days, then stop 20 tablet 0   No facility-administered medications prior to visit.        Objective:   Physical Exam Filed Vitals:   02/06/15 1654 02/06/15 1655  BP:  132/90  Pulse:  88  Height:  (1.778 m)   Weight: 210 lb (95.255 kg)   SpO2:  97%   Gen:  Pleasant, well-nourished, in no distress,  normal affect  ENT: No lesions,  mouth clear,  oropharynx clear, no postnasal drip  Neck: No JVD, no TMG, no carotid bruits  Lungs: No use of accessory muscles, no dullness to percussion, clear without rales or rhonchi  Cardiovascular: RRR, heart sounds normal, no murmur or gallops, no peripheral edema  Musculoskeletal: No deformities, no cyanosis or clubbing  Neuro: alert, non focal  Skin: Warm, no lesions or rashes      Assessment & Plan:  Intrinsic asthma Please continue QVAR 2 puffs twice a day  Depending on how you are doing after the Spring, we could going to 2 puffs once a day  Keep albuterol available to use needed.  Try increasing fluticasone nasal spray to 2 sprays each side twice a day during the allergy season Continue your dexilant and clarinex as you are taking them  Follow with Dr Delton CoombesByrum in 6 months or sooner if you have any problems   Allergic rhinitis - clarinex - increase fluticasone during the spring months.    GERD (gastroesophageal reflux disease) - trialing dexilant as a substitute for omeprazole per Dr Bosie ClosSchooler

## 2015-02-06 NOTE — Assessment & Plan Note (Signed)
-   clarinex - increase fluticasone during the spring months.

## 2015-02-06 NOTE — Assessment & Plan Note (Signed)
-   trialing dexilant as a substitute for omeprazole per Dr Bosie ClosSchooler

## 2015-02-06 NOTE — Progress Notes (Signed)
PFT done today. 

## 2015-02-06 NOTE — Assessment & Plan Note (Signed)
Please continue QVAR 2 puffs twice a day  Depending on how you are doing after the Spring, we could going to 2 puffs once a day  Keep albuterol available to use needed.  Try increasing fluticasone nasal spray to 2 sprays each side twice a day during the allergy season Continue your dexilant and clarinex as you are taking them  Follow with Dr Byrum in 6 months or sooner if you have any problems  

## 2015-02-07 ENCOUNTER — Ambulatory Visit
Admission: RE | Admit: 2015-02-07 | Discharge: 2015-02-07 | Disposition: A | Discharge status: Home / Self Care | Payer: 59 | Source: Ambulatory Visit | Attending: Orthopedic Surgery | Admitting: Orthopedic Surgery

## 2015-02-07 DIAGNOSIS — M533 Sacrococcygeal disorders, not elsewhere classified: Principal | ICD-10-CM

## 2015-02-07 DIAGNOSIS — G8929 Other chronic pain: Secondary | ICD-10-CM

## 2015-02-07 MED ORDER — METHYLPREDNISOLONE ACETATE 40 MG/ML INJ SUSP (RADIOLOG: 120.0000 mg | Freq: Once | INTRAMUSCULAR | Status: DC

## 2015-02-08 ENCOUNTER — Other Ambulatory Visit: Payer: Self-pay | Admitting: Obstetrics and Gynecology

## 2015-02-08 DIAGNOSIS — Z1231 Encounter for screening mammogram for malignant neoplasm of breast: Secondary | ICD-10-CM

## 2015-02-16 ENCOUNTER — Ambulatory Visit (HOSPITAL_COMMUNITY)
Admission: RE | Admit: 2015-02-16 | Discharge: 2015-02-16 | Disposition: A | Discharge status: Home / Self Care | Payer: 59 | Source: Ambulatory Visit | Attending: Obstetrics and Gynecology | Admitting: Obstetrics and Gynecology

## 2015-02-16 DIAGNOSIS — Z1231 Encounter for screening mammogram for malignant neoplasm of breast: Secondary | ICD-10-CM | POA: Diagnosis not present

## 2015-07-01 ENCOUNTER — Emergency Department (HOSPITAL_COMMUNITY)
Admission: EM | Admit: 2015-07-01 | Discharge: 2015-07-01 | Disposition: A | Discharge status: Home / Self Care | Payer: 59 | Attending: Emergency Medicine | Admitting: Emergency Medicine

## 2015-07-01 ENCOUNTER — Encounter (HOSPITAL_COMMUNITY): Payer: Self-pay | Admitting: Nurse Practitioner

## 2015-07-01 DIAGNOSIS — K5900 Constipation, unspecified: Secondary | ICD-10-CM | POA: Diagnosis not present

## 2015-07-01 DIAGNOSIS — M1990 Unspecified osteoarthritis, unspecified site: Secondary | ICD-10-CM | POA: Insufficient documentation

## 2015-07-01 DIAGNOSIS — Z79899 Other long term (current) drug therapy: Secondary | ICD-10-CM | POA: Insufficient documentation

## 2015-07-01 DIAGNOSIS — R200 Anesthesia of skin: Secondary | ICD-10-CM | POA: Diagnosis not present

## 2015-07-01 DIAGNOSIS — R531 Weakness: Secondary | ICD-10-CM | POA: Diagnosis not present

## 2015-07-01 DIAGNOSIS — R350 Frequency of micturition: Secondary | ICD-10-CM | POA: Diagnosis not present

## 2015-07-01 DIAGNOSIS — Z7951 Long term (current) use of inhaled steroids: Secondary | ICD-10-CM | POA: Diagnosis not present

## 2015-07-01 DIAGNOSIS — I10 Essential (primary) hypertension: Secondary | ICD-10-CM | POA: Insufficient documentation

## 2015-07-01 DIAGNOSIS — Z91040 Latex allergy status: Secondary | ICD-10-CM | POA: Insufficient documentation

## 2015-07-01 DIAGNOSIS — R102 Pelvic and perineal pain: Secondary | ICD-10-CM | POA: Insufficient documentation

## 2015-07-01 DIAGNOSIS — M545 Low back pain, unspecified: Secondary | ICD-10-CM

## 2015-07-01 DIAGNOSIS — Z87891 Personal history of nicotine dependence: Secondary | ICD-10-CM | POA: Insufficient documentation

## 2015-07-01 DIAGNOSIS — Z791 Long term (current) use of non-steroidal anti-inflammatories (NSAID): Secondary | ICD-10-CM | POA: Insufficient documentation

## 2015-07-01 DIAGNOSIS — G8929 Other chronic pain: Secondary | ICD-10-CM

## 2015-07-01 LAB — URINALYSIS, ROUTINE W REFLEX MICROSCOPIC
BILIRUBIN URINE: NEGATIVE
GLUCOSE, UA: NEGATIVE mg/dL
HGB URINE DIPSTICK: NEGATIVE
KETONES UR: NEGATIVE mg/dL
Leukocytes, UA: NEGATIVE
Nitrite: NEGATIVE
PROTEIN: NEGATIVE mg/dL
Specific Gravity, Urine: 1.004 — ABNORMAL LOW (ref 1.005–1.030)
UROBILINOGEN UA: 0.2 mg/dL (ref 0.0–1.0)
pH: 7 (ref 5.0–8.0)

## 2015-07-01 MED ORDER — BACLOFEN 20 MG PO TABS: 20.0000 mg | ORAL_TABLET | Freq: Three times a day (TID) | ORAL | Status: AC

## 2015-07-01 NOTE — ED Provider Notes (Signed)
CSN: [id number]   Arrival date & time 07/01/15 1224  History  This chart was scribed for non-physician practitioner, Trixie Dredge PA-C, working with Marily Memos, MD by Bethel Born, ED Scribe. This patient was seen in room TR09C/TR09C and the patient's care was started at 2:30 PM.  Chief Complaint  Patient presents with  . Back Pain    HPI The history is provided by the patient. No language interpreter was used.   Ashley [name] is a 59 y.o. female with PMHx of chronic SI joint dysfunction, lumbar radicular pain, fibromyalgia who presents to the Emergency Department complaining of chronic lower back pain exacerbation with onset in the last several days after a 5 hour drive. The pain radiates to the LLE and groin and is rated 10/10 in severity. . Her prescribed medication (Baclofen, gabapentin, naproxen, hydrocodone, tylenol with codeine, and hydromorphone) and using a whirlpool provided insufficient pain relief PTA.  Associated symptoms include "charlie horses" in the thighs, left leg weakness, and left foot numbness. States this pain that radiates into her groin (perineum) and thigh feel exactly like the pain she had before she had a nerve ablation performed at [facility].  She notes falling forward up the steps yesterday due to pain, denies any injury. No head injury or LOC. Also complains of increased urinary frequency, without dysuria or urgency. Pt denies LE swelling, incontinence of bowel or bladder, polydipsia, polyphagia, fever, chills, abdominal pain, and abnormal vaginal discharge or bleeding. Her orthopedist (Dr. Yevette Edwards)  is at [facility] and she last saw him at the beginning of the month.  She denies being on a pain contract.   Past Medical History  Diagnosis Date  . Arthritis   . Hypertension   . Lumbar radicular pain 09/04/2014  . Fibromyalgia 09/04/2014    Past Surgical History  Procedure Laterality Date  . Esophageal manometry [name] 11/21/2013     Procedure: ESOPHAGEAL MANOMETRY (EM);  Surgeon: Shirley Friar, MD;  Location: [facility];  Service: Endoscopy;  Laterality: [name];  . Breast surgery    . Abdominal hysterectomy    . Knee arthroscopy w/ meniscal repair    . Bunionectomy    . Nasal sinus surgery    . Lumbar laminectomy      Family History  Problem Relation Age of Onset  . Cancer Other   . Hypertension Other   . Diabetes Other   . Aneurysm Father   . Bipolar disorder Brother   . Diabetes Brother     Social History  Substance Use Topics  . Smoking status: Former Smoker -- 0.75 packs/day for 24 years    Types: Cigarettes    Quit date: 11/03/1992  . Smokeless tobacco: None  . Alcohol Use: Yes     Comment: occasionally     Review of Systems  Constitutional: Negative for fever and chills.  Gastrointestinal: Positive for constipation. Negative for nausea, vomiting, abdominal pain and diarrhea.  Endocrine: Negative for polydipsia.  Genitourinary: Positive for frequency and pelvic pain. Negative for dysuria, urgency, vaginal bleeding, vaginal discharge and difficulty urinating.  Musculoskeletal: Positive for back pain. Negative for gait problem.  Skin: Negative for rash.  Allergic/Immunologic: Negative for immunocompromised state.  Neurological: Positive for weakness.  Psychiatric/Behavioral: Negative for self-injury.    Home Medications   Prior to Admission medications   Medication Sig Start Date End Date Taking? Authorizing Provider  acetaminophen (TYLENOL ARTHRITIS PAIN) 650 MG CR tablet Take 1,300 mg by mouth every 8 (eight) hours as needed for  pain.    Historical Provider, MD  albuterol (PROVENTIL HFA;VENTOLIN HFA) 108 (90 BASE) MCG/ACT inhaler Inhale into the lungs every 6 (six) hours as needed for wheezing or shortness of breath.    Historical Provider, MD  azelastine (OPTIVAR) 0.05 % ophthalmic solution Place 1 drop into both eyes 2 (two) times daily.    Historical Provider, MD  baclofen (LIORESAL) 10  MG tablet Take 10 mg by mouth 2 (two) times daily with a meal.  10/31/13   Historical Provider, MD  beclomethasone (QVAR) 80 MCG/ACT inhaler Inhale 2 puffs into the lungs 2 (two) times daily. 12/21/14   Tammy S Parrett, NP  benzonatate (TESSALON) 200 MG capsule Take 1 capsule (200 mg total) by mouth 3 (three) times daily as needed for cough. 12/21/14   Tammy S Parrett, NP  desloratadine (CLARINEX) 5 MG tablet Take 5 mg by mouth daily.    Historical Provider, MD  dexlansoprazole (DEXILANT) 60 MG capsule Take 60 mg by mouth daily.    Historical Provider, MD  fluticasone (FLONASE) 50 MCG/ACT nasal spray Place 2 sprays into both nostrils daily.     Historical Provider, MD  gabapentin (NEURONTIN) 800 MG tablet Take 800 mg by mouth 3 (three) times daily.    Historical Provider, MD  hydrochlorothiazide (HYDRODIURIL) 25 MG tablet Take 25 mg by mouth daily.  11/21/13   Historical Provider, MD  HYDROcodone-acetaminophen (VICODIN) 5-500 MG per tablet Take 1 tablet by mouth every 6 (six) hours as needed for pain.    Historical Provider, MD  HYDROmorphone (DILAUDID) 2 MG tablet Take 1-2 mg by mouth every 4 (four) hours as needed for moderate pain or severe pain.    Historical Provider, MD  naproxen (NAPROSYN) 500 MG tablet Take 500 mg by mouth 2 (two) times daily.  11/20/13   Historical Provider, MD  nortriptyline (PAMELOR) 25 MG capsule Take 25 mg by mouth at bedtime.  11/11/13   Historical Provider, MD  ondansetron (ZOFRAN-ODT) 4 MG disintegrating tablet Take 4 mg by mouth every 8 (eight) hours as needed for nausea or vomiting.    Historical Provider, MD  PRESCRIPTION MEDICATION Place 2 sprays into both nostrils daily.    Historical Provider, MD    Allergies  Latex; Sulfa antibiotics; Aspirin; Oxycodone; and Tramadol  Triage Vitals: BP 125/85 mmHg  Pulse 89  Temp(Src) 98 F (36.7 C) (Oral)  Resp 16  Ht  (1.753 m)  Wt 206 lb (93.441 kg)  BMI 30.41 kg/m2  SpO2 97%  Physical Exam  Constitutional: She  appears well-developed and well-nourished. No distress.  HENT:  Head: Normocephalic and atraumatic.  Neck: Neck supple.  Pulmonary/Chest: Effort normal.  Abdominal: Soft. She exhibits no distension and no mass. There is no tenderness. There is no rebound and no guarding.  Musculoskeletal:  Spine nontender, no crepitus, or stepoffs. Tenderness at the left SI joint Lower extremities:  Strength 5/5 bilaterally, slightly decreased but intact sensation throughout left leg that is not dermatomal, distal pulses intact.    Ambulates without assistance back and forth to the restroom   Neurological: She is alert.  Skin: She is not diaphoretic.  Nursing note and vitals reviewed.   ED Course  Procedures  DIAGNOSTIC STUDIES: Oxygen Saturation is 97% on RA,  normal by my interpretation.    COORDINATION OF CARE: 2:37 PM Discussed treatment plan which includes UA with pt at bedside and pt agreed to plan.  Labs Review-  Labs Reviewed  URINALYSIS, ROUTINE W REFLEX MICROSCOPIC (NOT AT  [facility]) - Abnormal; Notable for the following:    Specific Gravity, Urine 1.004 (*)    All other components within normal limits  URINE CULTURE    Imaging Review No results found.  EKG Interpretation None      MDM   Final diagnoses:  Acute exacerbation of chronic low back pain   Afebrile, nontoxic patient with exacerbation of chronic back pain.  No new injuries.  No red flags with history or exam.  No weakness on exam, pt able to ambulate unassisted.  Emergent imaging not indicated at this time.   Per pt this is her chronic SI joint pain in the exact same location and quality but now radiating into perineum and left leg again, which had happened previously prior to orthopedic treatment.  She is on multiple medications at home for pain and I discussed with her that she needs most of all to follow up with Dr Yevette Edwards.  I did increase her baclofen from 10mg  TID to 20mg  TID.  I did engage in joint decision making  regarding imaging and medication treatment with this patient.  D/C home with close orthopedic follow up, change in baclofen dosage as above.  Discussed result, findings, treatment, and follow up  with patient.  Pt given return precautions.  Pt verbalizes understanding and agrees with plan.          I personally performed the services described in this documentation, which was scribed in my presence. The recorded information has been reviewed and is accurate.      Trixie Dredge, PA-C 07/01/15 1708  Marily Memos, MD 07/02/15 1030

## 2015-07-01 NOTE — ED Notes (Signed)
Declined W/C at D/C and was escorted to lobby by RN. 

## 2015-07-01 NOTE — Discharge Instructions (Signed)
Read the information below.  Use the prescribed medication as directed.  Please discuss all new medications with your pharmacist.  You may return to the Emergency Department at any time for worsening condition or any new symptoms that concern you.   If you develop fevers, loss of control of bowel or bladder, weakness or numbness in your legs, or are unable to walk, return to the ER for a recheck.    Sacroiliac Joint Dysfunction The sacroiliac joint connects the lower part of the spine (the sacrum) with the bones of the pelvis. CAUSES  Sometimes, there is no obvious reason for sacroiliac joint dysfunction. Other times, it may occur   During pregnancy.  After injury, such as:  Car accidents.  Sport-related injuries.  Work-related injuries.  Due to one leg being shorter than the other.  Due to other conditions that affect the joints, such as:  Rheumatoid arthritis.  Gout.  Psoriasis.  Joint infection (septic arthritis). SYMPTOMS  Symptoms may include:  Pain in the:  Lower back.  Buttocks.  Groin.  Thighs and legs.  Difficult sitting, standing, walking, lying, bending or lifting. DIAGNOSIS  A number of tests may be used to help diagnose the cause of sacroiliac joint dysfunction, including:  Imaging tests to look for other causes of pain, including:  MRI.  CT scan.  Bone scan.  Diagnostic injection: During a special x-ray (called fluoroscopy), a needle is put into the sacroiliac joint. A numbing medicine is injected into the joint. If the pain is improved or stopped, the diagnosis of sacroiliac joint dysfunction is more likely. TREATMENT  There are a number of types of treatment used for sacroiliac joint dysfunction, including:  Only take over-the-counter or prescription medicines for pain, discomfort, or fever as directed by your caregiver.  Medications to relax muscles.  Rest. Decreasing activity can help cut down on painful muscle spasms and allow the back  to heal.  Application of heat or ice to the lower back may improve muscle spasms and soothe pain.  Brace. A special back brace, called a sacroiliac belt, can help support the joint while your back is healing.  Physical therapy can help teach comfortable positions and exercises to strengthen muscles that support the sacroiliac joint.  Cortisone injections. Injections of steroid medicine into the joint can help decrease swelling and improve pain.  Hyaluronic acid injections. This chemical improves lubrication within the sacroiliac joint, thereby decreasing pain.  Radiofrequency ablation. A special needle is placed into the joint, where it burns away nerves that are carrying pain messages from the joint.  Surgery. Because pain occurs during movement of the joint, screws and plates may be installed in order to limit or prevent joint motion. HOME CARE INSTRUCTIONS   Take all medications exactly as directed.  Follow instructions regarding both rest and physical activity, to avoid worsening the pain.  Do physical therapy exercises exactly as prescribed. SEEK IMMEDIATE MEDICAL CARE IF:  You experience increasingly severe pain.  You develop new symptoms, such as numbness or tingling in your legs or feet.  You lose bladder or bowel control. Document Released: 01/16/2009 Document Revised: 01/12/2012 Document Reviewed: 01/16/2009 [facility] Patient Information 2015 [place], [place]. This information is not intended to replace advice given to you by your health care provider. Make sure you discuss any questions you have with your health care provider.  Back Pain, Adult Low back pain is very common. About 1 in 5 people have back pain.The cause of low back pain is rarely dangerous.  The pain often gets better over time.About half of people with a sudden onset of back pain feel better in just 2 weeks. About 8 in 10 people feel better by 6 weeks.  CAUSES Some common causes of back pain  include:  Strain of the muscles or ligaments supporting the spine.  Wear and tear (degeneration) of the spinal discs.  Arthritis.  Direct injury to the back. DIAGNOSIS Most of the time, the direct cause of low back pain is not known.However, back pain can be treated effectively even when the exact cause of the pain is unknown.Answering your caregiver's questions about your overall health and symptoms is one of the most accurate ways to make sure the cause of your pain is not dangerous. If your caregiver needs more information, he or she may order lab work or imaging tests (X-rays or MRIs).However, even if imaging tests show changes in your back, this usually does not require surgery. HOME CARE INSTRUCTIONS For many people, back pain returns.Since low back pain is rarely dangerous, it is often a condition that people can learn to [facility] their own.   Remain active. It is stressful on the back to sit or stand in one place. Do not sit, drive, or stand in one place for more than 30 minutes at a time. Take short walks on level surfaces as soon as pain allows.Try to increase the length of time you walk each day.  Do not stay in bed.Resting more than 1 or 2 days can delay your recovery.  Do not avoid exercise or work.Your body is made to move.It is not dangerous to be active, even though your back may hurt.Your back will likely heal faster if you return to being active before your pain is gone.  Pay attention to your body when you bend and lift. Many people have less discomfortwhen lifting if they bend their knees, keep the load close to their bodies,and avoid twisting. Often, the most comfortable positions are those that put less stress on your recovering back.  Find a comfortable position to sleep. Use a firm mattress and lie on your side with your knees slightly bent. If you lie on your back, put a pillow under your knees.  Only take over-the-counter or prescription medicines as  directed by your caregiver. Over-the-counter medicines to reduce pain and inflammation are often the most helpful.Your caregiver may prescribe muscle relaxant drugs.These medicines help dull your pain so you can more quickly return to your normal activities and healthy exercise.  Put ice on the injured area.  Put ice in a plastic bag.  Place a towel between your skin and the bag.  Leave the ice on for 15-20 minutes, 03-04 times a day for the first 2 to 3 days. After that, ice and heat may be alternated to reduce pain and spasms.  Ask your caregiver about trying back exercises and gentle massage. This may be of some benefit.  Avoid feeling anxious or stressed.Stress increases muscle tension and can worsen back pain.It is important to recognize when you are anxious or stressed and learn ways to manage it.Exercise is a great option. SEEK MEDICAL CARE IF:  You have pain that is not relieved with rest or medicine.  You have pain that does not improve in 1 week.  You have new symptoms.  You are generally not feeling well. SEEK IMMEDIATE MEDICAL CARE IF:   You have pain that radiates from your back into your legs.  You develop new bowel or bladder  control problems.  You have unusual weakness or numbness in your arms or legs.  You develop nausea or vomiting.  You develop abdominal pain.  You feel faint. Document Released: 10/20/2005 Document Revised: 04/20/2012 Document Reviewed: 02/21/2014 [facility] Patient Information 2015 [place], [place]. This information is not intended to replace advice given to you by your health care provider. Make sure you discuss any questions you have with your health care provider.

## 2015-07-01 NOTE — ED Notes (Signed)
She states she has SI joint dysfunction which she is being cared for at [facility] for. Pain is worse today and she has been taking her Rx meds with no relief. Pain is in L lower back radiating down L leg.

## 2015-07-02 LAB — URINE CULTURE: Culture: NO GROWTH

## 2015-09-19 ENCOUNTER — Ambulatory Visit (INDEPENDENT_AMBULATORY_CARE_PROVIDER_SITE_OTHER): Payer: 59 | Admitting: Emergency Medicine

## 2015-09-19 ENCOUNTER — Encounter: Payer: Self-pay | Admitting: Emergency Medicine

## 2015-09-19 VITALS — BP 132/96 | HR 93 | Ht 70.0 in | Wt 206.0 lb

## 2015-09-19 DIAGNOSIS — J309 Allergic rhinitis, unspecified: Secondary | ICD-10-CM | POA: Diagnosis not present

## 2015-09-19 DIAGNOSIS — J4521 Mild intermittent asthma with (acute) exacerbation: Secondary | ICD-10-CM | POA: Diagnosis not present

## 2015-09-19 NOTE — Patient Instructions (Signed)
Please continue Qvar 2 puffs daily Use albuterol 2 puffs if needed for shortness of breath, wheezing, coughing. You may want to use this 10-15 minutes before singing since this is one of your triggers.  Continue pantoprazole, Clarinex Please increase fluticasone nasal spray to 2 sprays each nostril twice a day Continue nasal saline washes daily We will send a prescription for Tessalon Perles to be used up to every 6 hours if needed for cough Your Pneumovax is up-to-date. He will be a candidate for the Prevnar 13 pneumonia shot next year Follow with Dr Delton CoombesByrum in 6 months or sooner if you have any problems

## 2015-09-19 NOTE — Progress Notes (Signed)
h Subjective:    Patient ID: Ashley [name], female    DOB: 19-Dec-1955, 59 y.o.   MRN: [id number]  Asthma She complains of cough and shortness of breath. There is no wheezing. Associated symptoms include headaches. Pertinent negatives include no ear pain, fever, postnasal drip, rhinorrhea, sneezing, sore throat or trouble swallowing. Her past medical history is significant for asthma.   59 yo woman, former smoker (20 pk-yrs), hx HTN, Allergies, GERD. Has been given dx of asthma associated with her GERD. She describes cough that bothers her in the am and brings up mucous, shortness of breath with exertion. She used to hear wheeze, but this has improved since QVAR and omeprazole. She is on [place], desloratadine, fluticasone nasal. Has been seen by Dr Willa Rough in Allergy and had spirometry. Was started on QVAR and her breathing has improved. She does not wheeze now. She has some triggers > leaves, fresh cut grass, smoke. She uses SABA rarely - both times after she was singing. She has gained some wt > 25-30 lbs over 2 yrs.   ROV 02/06/15 -- follow up for her asthma. She was seen and treated in 2/'16 for an apparent flare with cough, following a URI. She had run out of QVAR at the time. She has benefited significantly from restarting the QVAR.  Rarely uses her SABA. She is on fluticasone qd, clarinex, dexilant. She had PFT today   ROV 09/19/15 -- follow-up visit for history of former tobacco use and asthma, impacted by her GERD and allergic rhinitis. She has gotten rid of her carpet, has helped her allergies some. She reliably coughs every morning after her tea. She is on pantoprazole, clarinex, fluticasone, QVAR qd. has a dry cough during the night. Uses albuterol prn, very rarely. No asthma flares. She wonders if she needs tessalon for qhs.   Review of Systems  Constitutional: Negative for fever and unexpected weight change.  HENT: Negative for congestion, dental problem, ear pain, nosebleeds,  postnasal drip, rhinorrhea, sinus pressure, sneezing, sore throat and trouble swallowing.   Eyes: Negative for redness and itching.  Respiratory: Positive for cough and shortness of breath. Negative for choking, chest tightness and wheezing.   Cardiovascular: Negative for palpitations and leg swelling.  Gastrointestinal: Negative for nausea and vomiting.  Genitourinary: Negative for dysuria.  Musculoskeletal: Negative for joint swelling.  Skin: Negative for rash.  Neurological: Positive for headaches.  Hematological: Does not bruise/bleed easily.  Psychiatric/Behavioral: Negative for dysphoric mood. The patient is not nervous/anxious.     Past Medical History  Diagnosis Date  . Arthritis   . Hypertension   . Lumbar radicular pain 09/04/2014  . Fibromyalgia 09/04/2014     Family History  Problem Relation Age of Onset  . Cancer Other   . Hypertension Other   . Diabetes Other   . Aneurysm Father   . Bipolar disorder Brother   . Diabetes Brother      Social History   Social History  . Marital Status: Married    Spouse Name: [name]  . Number of Children: [name]  . Years of Education: [name]   Occupational History  . Not on file.   Social History Main Topics  . Smoking status: Former Smoker -- 0.75 packs/day for 24 years    Types: Cigarettes    Quit date: 11/03/1992  . Smokeless tobacco: Not on file  . Alcohol Use: Yes     Comment: occasionally  . Drug Use: No  . Sexual Activity: Not on file  Other Topics Concern  . Not on file   Social History Narrative     Allergies  Allergen Reactions  . Latex Itching, Swelling and Other (See Comments)    redness  . Sulfa Antibiotics Diarrhea and Nausea And Vomiting  . Aspirin Other (See Comments)    Can cause her to bleed out due to G6 PD  . Oxycodone Itching    "all over"  . Tramadol Other (See Comments)    Makes her feel "spaced out on street drugs"     Outpatient Prescriptions Prior to Visit  Medication Sig Dispense  Refill  . acetaminophen (TYLENOL ARTHRITIS PAIN) 650 MG CR tablet Take 1,300 mg by mouth every 8 (eight) hours as needed for pain.    Marland Kitchen albuterol (PROVENTIL HFA;VENTOLIN HFA) 108 (90 BASE) MCG/ACT inhaler Inhale into the lungs every 6 (six) hours as needed for wheezing or shortness of breath.    Marland Kitchen azelastine (OPTIVAR) 0.05 % ophthalmic solution Place 1 drop into both eyes 2 (two) times daily.    . baclofen (LIORESAL) 20 MG tablet Take 1 tablet (20 mg total) by mouth 3 (three) times daily. 15 each 0  . beclomethasone (QVAR) 80 MCG/ACT inhaler Inhale 2 puffs into the lungs 2 (two) times daily. 1 Inhaler 6  . benzonatate (TESSALON) 200 MG capsule Take 1 capsule (200 mg total) by mouth 3 (three) times daily as needed for cough. 45 capsule 0  . desloratadine (CLARINEX) 5 MG tablet Take 5 mg by mouth daily.    Marland Kitchen dexlansoprazole (DEXILANT) 60 MG capsule Take 60 mg by mouth daily.    . fluticasone (FLONASE) 50 MCG/ACT nasal spray Place 2 sprays into both nostrils daily.     Marland Kitchen gabapentin (NEURONTIN) 800 MG tablet Take 800 mg by mouth 3 (three) times daily.    . hydrochlorothiazide (HYDRODIURIL) 25 MG tablet Take 25 mg by mouth daily.     Marland Kitchen HYDROcodone-acetaminophen (VICODIN) 5-500 MG per tablet Take 1 tablet by mouth every 6 (six) hours as needed for pain.    Marland Kitchen HYDROmorphone (DILAUDID) 2 MG tablet Take 1-2 mg by mouth every 4 (four) hours as needed for moderate pain or severe pain.    . naproxen (NAPROSYN) 500 MG tablet Take 500 mg by mouth 2 (two) times daily.     . nortriptyline (PAMELOR) 25 MG capsule Take 25 mg by mouth at bedtime.     . ondansetron (ZOFRAN-ODT) 4 MG disintegrating tablet Take 4 mg by mouth every 8 (eight) hours as needed for nausea or vomiting.    Marland Kitchen PRESCRIPTION MEDICATION Place 2 sprays into both nostrils daily.     No facility-administered medications prior to visit.        Objective:   Physical Exam Filed Vitals:   09/19/15 1401  BP: 132/96  Pulse: 93  Height:   (1.778 m)  Weight: 206 lb (93.441 kg)  SpO2: 95%   Gen: Pleasant, well-nourished, in no distress,  normal affect  ENT: No lesions,  mouth clear,  oropharynx clear, no postnasal drip  Neck: No JVD, no TMG, no carotid bruits  Lungs: No use of accessory muscles,  clear without rales or rhonchi  Cardiovascular: RRR, heart sounds normal, no murmur or gallops, no peripheral edema  Musculoskeletal: No deformities, no cyanosis or clubbing  Neuro: alert, non focal  Skin: Warm, no lesions or rashes      Assessment & Plan:  Intrinsic asthma No flares. Reasonable control but some increased symptoms particularly related to her  allergic rhinitis and singing.   Please continue Qvar 2 puffs daily Use albuterol 2 puffs if needed for shortness of breath, wheezing, coughing. You may want to use this 10-15 minutes before singing since this is one of your triggers.  Continue pantoprazole, Clarinex Please increase fluticasone nasal spray to 2 sprays each nostril twice a day Continue nasal saline washes daily We will send a prescription for Tessalon Perles to be used up to every 6 hours if needed for cough Your Pneumovax is up-to-date. He will be a candidate for the Prevnar 13 pneumonia shot next year Follow with Dr Delton CoombesByrum in 6 months or sooner if you have any problems  Allergic rhinitis Increased fatigue as a nasal spray to twice a day Continue Clarinex, nasal saline washes Add Tessalon Perles when necessary

## 2015-09-19 NOTE — Assessment & Plan Note (Signed)
Increased fatigue as a nasal spray to twice a day Continue Clarinex, nasal saline washes Add Tessalon Perles when necessary

## 2015-09-19 NOTE — Assessment & Plan Note (Signed)
No flares. Reasonable control but some increased symptoms particularly related to her allergic rhinitis and singing.   Please continue Qvar 2 puffs daily Use albuterol 2 puffs if needed for shortness of breath, wheezing, coughing. You may want to use this 10-15 minutes before singing since this is one of your triggers.  Continue pantoprazole, Clarinex Please increase fluticasone nasal spray to 2 sprays each nostril twice a day Continue nasal saline washes daily We will send a prescription for Tessalon Perles to be used up to every 6 hours if needed for cough Your Pneumovax is up-to-date. He will be a candidate for the Prevnar 13 pneumonia shot next year Follow with Dr Delton CoombesByrum in 6 months or sooner if you have any problems

## 2015-10-02 ENCOUNTER — Telehealth: Payer: Self-pay | Admitting: Emergency Medicine

## 2015-10-02 MED ORDER — BECLOMETHASONE DIPROPIONATE 80 MCG/ACT IN AERS: 2.0000 | INHALATION_SPRAY | Freq: Two times a day (BID) | RESPIRATORY_TRACT | Status: DC

## 2015-10-02 MED ORDER — FLUTICASONE PROPIONATE 50 MCG/ACT NA SUSP: 2.0000 | Freq: Two times a day (BID) | NASAL | Status: DC

## 2015-10-02 NOTE — Telephone Encounter (Signed)
Called and spoke to pt. Pt is requesting Qvar and flonase be sent to [facility]. Both rx have been sent to preferred pharmacy for 90 day supply. Pt verbalized understanding and denied any further questions or concerns at this time.

## 2015-12-31 ENCOUNTER — Other Ambulatory Visit: Payer: Self-pay | Admitting: Gastroenterology

## 2015-12-31 DIAGNOSIS — R4702 Dysphasia: Secondary | ICD-10-CM

## 2016-01-03 ENCOUNTER — Ambulatory Visit
Admission: RE | Admit: 2016-01-03 | Discharge: 2016-01-03 | Disposition: A | Discharge status: Home / Self Care | Payer: 59 | Source: Ambulatory Visit | Attending: Gastroenterology | Admitting: Gastroenterology

## 2016-01-03 DIAGNOSIS — R4702 Dysphasia: Secondary | ICD-10-CM

## 2016-01-15 ENCOUNTER — Other Ambulatory Visit: Payer: Self-pay | Admitting: Family Medicine

## 2016-01-15 ENCOUNTER — Other Ambulatory Visit: Payer: Self-pay

## 2016-01-15 DIAGNOSIS — E010 Iodine-deficiency related diffuse (endemic) goiter: Secondary | ICD-10-CM

## 2016-01-15 DIAGNOSIS — Z1231 Encounter for screening mammogram for malignant neoplasm of breast: Secondary | ICD-10-CM

## 2016-01-24 ENCOUNTER — Ambulatory Visit
Admission: RE | Admit: 2016-01-24 | Discharge: 2016-01-24 | Disposition: A | Discharge status: Home / Self Care | Payer: 59 | Source: Ambulatory Visit | Attending: Family Medicine | Admitting: Family Medicine

## 2016-01-24 DIAGNOSIS — E010 Iodine-deficiency related diffuse (endemic) goiter: Secondary | ICD-10-CM

## 2016-02-18 ENCOUNTER — Ambulatory Visit
Admission: RE | Admit: 2016-02-18 | Discharge: 2016-02-18 | Disposition: A | Discharge status: Home / Self Care | Payer: 59 | Source: Ambulatory Visit

## 2016-02-18 DIAGNOSIS — Z1231 Encounter for screening mammogram for malignant neoplasm of breast: Secondary | ICD-10-CM

## 2016-03-10 ENCOUNTER — Encounter: Payer: Self-pay | Admitting: Physical Medicine & Rehabilitation

## 2016-03-10 ENCOUNTER — Ambulatory Visit (HOSPITAL_BASED_OUTPATIENT_CLINIC_OR_DEPARTMENT_OTHER): Payer: 59 | Admitting: Physical Medicine & Rehabilitation

## 2016-03-10 ENCOUNTER — Encounter: Payer: 59 | Attending: Physical Medicine & Rehabilitation

## 2016-03-10 VITALS — BP 134/67 | HR 97 | Resp 14

## 2016-03-10 DIAGNOSIS — M4716 Other spondylosis with myelopathy, lumbar region: Secondary | ICD-10-CM | POA: Insufficient documentation

## 2016-03-10 DIAGNOSIS — Z79899 Other long term (current) drug therapy: Secondary | ICD-10-CM | POA: Diagnosis not present

## 2016-03-10 DIAGNOSIS — M797 Fibromyalgia: Secondary | ICD-10-CM | POA: Insufficient documentation

## 2016-03-10 DIAGNOSIS — M545 Low back pain: Secondary | ICD-10-CM | POA: Diagnosis not present

## 2016-03-10 DIAGNOSIS — G894 Chronic pain syndrome: Secondary | ICD-10-CM

## 2016-03-10 DIAGNOSIS — M533 Sacrococcygeal disorders, not elsewhere classified: Secondary | ICD-10-CM | POA: Diagnosis not present

## 2016-03-10 DIAGNOSIS — Z5181 Encounter for therapeutic drug level monitoring: Secondary | ICD-10-CM

## 2016-03-10 MED ORDER — NORTRIPTYLINE HCL 25 MG PO CAPS: 25.0000 mg | ORAL_CAPSULE | Freq: Every day | ORAL | Status: DC

## 2016-03-10 MED ORDER — NAPROXEN 500 MG PO TABS: 500.0000 mg | ORAL_TABLET | Freq: Two times a day (BID) | ORAL | Status: DC

## 2016-03-10 NOTE — Patient Instructions (Addendum)
Please call [facility] to see if they have the arthritis Foundation aquatic exercise for fibromyalgia or arthritis  Referral to [facility] for manual therapy, fibromyalgia program  [facility] Address: [address], [place], [place] [address] [phone] Hours: Open today  8:00 am - 8:00 pm

## 2016-03-10 NOTE — Progress Notes (Signed)
Subjective:    Patient ID: Muslima Toppins, female    DOB: October 24, 1956, 60 y.o.   MRN: [id number] Chief complaint is low back pain, groin pain, left posterior thigh pain and perianal pain. HPI 60 year old female with a long history of fibromyalgia as well as back pain. Has a history of L4-5 laminectomy in 2002 by Dr. Ezzard Standing. Approximately one year ago she fell while leaning on a table and fell onto her buttocks causing onset of increased pain which started radiating into the thigh and groin area as well as the perianal area. Occasionally gets vaginal pain as well when her pain flares up a lot Patient also gets some pain going down her left posterior thigh skips the leg area but goes into the bottom of the foot. This was improved after radiofrequency ablation of the left sacroiliac area. Patient underwent radiofrequency of the sacroiliac joint in July 2016. Patient had RFA [facility] by Dr. Regino Schultze.  No pain with bowel movements. Patient also has some bladder spasms but no incontinence.  Patient's goal is to control her chronic pain without narcotic medications.  Patient currently taking Tylenol 3 2 tablets on Friday, 2 tablets on Saturday. Occasionally takes a third tablet on Saturdays. She states it makes her feel drugged. 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 Independent with all her self-care and mobility. Drives.  Takes Cymbalta on a daily basis except when she takes her [place]  Fibromyalgia was diagnosed by Dr. Kellie Simmering, rheumatology, several years ago Pain Inventory Average Pain 9 Pain Right Now 8 My pain is burning, stabbing and aching  In the last 24 hours, has pain interfered with the following? General activity 9 Relation with others  7 Enjoyment of life 10 What TIME of day is your pain at its worst? daytime, evening, night  Sleep (in general) Poor  Pain is worse with: walking, bending, sitting, standing and some activites Pain improves with: rest and medication Relief from Meds: 5  Mobility walk with assistance use a cane how many minutes can you walk? 2-3 ability to climb steps?  yes do you drive?  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 No evidence of a neuropathy is seen. EMG evaluation of the right lower extremity is unremarkable, without evidence of an overlying lumbosacral radiculopathy.  Marlan Palau MD 09/12/2014 1:53 PM  Physicians involved in your care new visit   Family History  Problem Relation Age of Onset  . Cancer Other   . Hypertension Other   . Diabetes Other   . Aneurysm Father   . Bipolar disorder Brother   . Diabetes Brother    Social History   Social History  . Marital Status: Married    Spouse Name: [name]  . Number of Children: [name]  . Years of Education: [name]   Social History Main Topics  . Smoking status: Former Smoker -- 0.75 packs/day for 24 years    Types: Cigarettes    Quit date: 11/03/1992  .  Smokeless tobacco: None  . Alcohol Use: Yes     Comment: occasionally  . Drug Use: No  . Sexual Activity: Not Asked   Other Topics Concern  . None   Social History Narrative   Past Surgical History  Procedure Laterality Date  . Esophageal manometry [name] 11/21/2013    Procedure: ESOPHAGEAL MANOMETRY (EM);  Surgeon: Shirley Friar, MD;  Location: [facility];  Service: Endoscopy;  Laterality: [name];  . Breast surgery    . Abdominal hysterectomy     . Knee arthroscopy w/ meniscal repair    . Bunionectomy    . Nasal sinus surgery    . Lumbar laminectomy     Past Medical History  Diagnosis Date  . Arthritis   . Hypertension   . Lumbar radicular pain 09/04/2014  . Fibromyalgia 09/04/2014   BP 134/67 mmHg  Pulse 97  Resp 14  SpO2 97%  Opioid Risk Score:   Fall Risk Score:  `1  Depression screen PHQ 2/9  Depression screen PHQ 2/9 06/29/2014  Decreased Interest 0  Down, Depressed, Hopeless 0  PHQ - 2 Score 0     Review of Systems  Constitutional: Positive for diaphoresis and unexpected weight change.  Respiratory: Positive for cough and shortness of breath.   Gastrointestinal: Positive for nausea, diarrhea and constipation.  All other systems reviewed and are negative.      Objective:   Physical Exam  Constitutional: She is oriented to person, place, and time. She appears well-developed and well-nourished.  HENT:  Head: Normocephalic and atraumatic.  Eyes: Conjunctivae are normal. Pupils are equal, round, and reactive to light.  Cardiovascular: Normal rate and regular rhythm.   Pulmonary/Chest: Effort normal and breath sounds normal.  Abdominal: Soft. Bowel sounds are normal.  Neurological: She is alert and oriented to person, place, and time.  Psychiatric: She has a normal mood and affect.  Nursing note and vitals reviewed.  Tenderness over the left sacroiliac area. Negative Faber's  Patient has tenderness in the fibromyalgia points left trapezius bilateral lateral epicondyles, bilateral knee, bilateral hip, bilateral lumbosacral Negative straight leg raising test Sensation reduced in the left L5 and left S1 dermatomal distribution Gait is without evidence of toe drag or knee instability, Patient uses a cane Patient has normal hip range of motion bilaterally Upper extremity range of motion is normal at the shoulder elbow wrist and finger. Neck range of motion is normal       Assessment & Plan:  1.  Chronic pain syndrome multifactorial.  She has fibromyalgia syndrome diagnosed by rheumatology several years ago, Agree with Cymbalta 30 mg per day, she may consider increasing the dose  To 60 mg. We discussed this but she does not wish to do so at the current time. She continues on nortriptyline 25 mg by mouth daily at bedtime, will write for a one-month supply given that her express scripts have not come yet  In terms of exercise we discussed an excise program, specifically aquatic therapy and she will try calling the [facility] to see if there is an arthritis [facility] program available in her area. She may benefit from acupuncture for her fibromyalgia. We'll schedule weekly visits 4  PT referral for manual therapy, fibromyalgia education, initiate gentle aerobic exercise program, self-management techniques, will request Integrative therapies  Left sacroiliac dysfunction, this was her work-related injury from one year ago. She continues to follow up with [facility] on this. She may benefit from a repeat left sacroiliac RFA  Lumbar  pain, she has evidence of L3-L4, L4-L5, L5-S1 lumbar facet arthropathy. Her last MRI on record is [id number] which did not show any nerve root or central canal compromise.  We'll continue Naprosyn 500 mg twice a day for now. She may benefit from lumbar medial branch blocks and possible RFA  Left lower extremity pain, This involves her thigh as well as her left foot, atypical, she notes relief after left sacroiliac RFA, There was some epidural fibrosis at her surgical site at L4-L5 Lower extremity EMG/NCV 2015 was normal This is not one of her major concerns at the current time. No need for reimaging at the current time.

## 2016-03-17 ENCOUNTER — Ambulatory Visit: Payer: 59 | Admitting: Emergency Medicine

## 2016-03-18 ENCOUNTER — Ambulatory Visit (INDEPENDENT_AMBULATORY_CARE_PROVIDER_SITE_OTHER): Payer: 59 | Admitting: Emergency Medicine

## 2016-03-18 ENCOUNTER — Encounter: Payer: Self-pay | Admitting: Emergency Medicine

## 2016-03-18 VITALS — BP 126/82 | HR 94 | Ht 69.0 in | Wt 223.0 lb

## 2016-03-18 DIAGNOSIS — J4521 Mild intermittent asthma with (acute) exacerbation: Secondary | ICD-10-CM | POA: Diagnosis not present

## 2016-03-18 DIAGNOSIS — J309 Allergic rhinitis, unspecified: Secondary | ICD-10-CM | POA: Diagnosis not present

## 2016-03-18 DIAGNOSIS — K219 Gastro-esophageal reflux disease without esophagitis: Secondary | ICD-10-CM | POA: Diagnosis not present

## 2016-03-18 NOTE — Assessment & Plan Note (Addendum)
Continue Flonase, nasal saline washes, Clarinex. She has considered starting restarting immunotherapy although it's not clear that there are any targets defined on her skin testing

## 2016-03-18 NOTE — Progress Notes (Signed)
h Subjective:    Patient ID: Ashley [name], female    DOB: Dec 10, 1955, 60 y.o.   MRN: [id number]  Asthma She complains of cough and shortness of breath. There is no wheezing. Associated symptoms include headaches. Pertinent negatives include no ear pain, fever, postnasal drip, rhinorrhea, sneezing, sore throat or trouble swallowing. Her past medical history is significant for asthma.   60 yo woman, former smoker (20 pk-yrs), hx HTN, Allergies, GERD. Has been given dx of asthma associated with her GERD. She describes cough that bothers her in the am and brings up mucous, shortness of breath with exertion. She used to hear wheeze, but this has improved since QVAR and omeprazole. She is on [place], desloratadine, fluticasone nasal. Has been seen by Dr Willa Rough in [facility] and had spirometry. Was started on QVAR and her breathing has improved. She does not wheeze now. She has some triggers > leaves, fresh cut grass, smoke. She uses SABA rarely - both times after she was singing. She has gained some wt > 25-30 lbs over 2 yrs.   ROV 02/06/15 -- follow up for her asthma. She was seen and treated in 2/'16 for an apparent flare with cough, following a URI. She had run out of QVAR at the time. She has benefited significantly from restarting the QVAR.  Rarely uses her SABA. She is on fluticasone qd, clarinex, dexilant. She had PFT today   ROV 09/19/15 -- follow-up visit for history of former tobacco use and asthma, impacted by her GERD and allergic rhinitis. She has gotten rid of her carpet, has helped her allergies some. She reliably coughs every morning after her tea. She is on pantoprazole, clarinex, fluticasone, QVAR qd. has a dry cough during the night. Uses albuterol prn, very rarely. No asthma flares. She wonders if she needs tessalon for qhs.  ROV 03/18/16 -- follow-up visit for patient with a history of asthma and former tobacco use. She also has GERD and allergic rhinitis. She feels that her breathing has  been pretty good. 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 Then she is better after she clears some mucous, good for the rest of the day. She uses albuterol many days, usually in the am. She is having nasal congestion, also has GERD. Seem to be under control with protonix, flonase, [place], clarinex. She is considering restarting allergy immunotherapy.    Review of Systems  Constitutional: Negative for fever and unexpected weight change.  HENT: Negative for congestion, dental problem, ear pain, nosebleeds, postnasal drip, rhinorrhea, sinus pressure, sneezing, sore throat and trouble swallowing.   Eyes: Negative for redness and itching.  Respiratory: Positive for cough and shortness of breath. Negative for choking, chest tightness and wheezing.   Cardiovascular: Negative for palpitations and leg swelling.  Gastrointestinal: Negative for nausea and vomiting.  Genitourinary: Negative for dysuria.  Musculoskeletal: Negative for joint swelling.  Skin: Negative for rash.  Neurological: Positive for headaches.  Hematological: Does not bruise/bleed easily.  Psychiatric/Behavioral: Negative for dysphoric mood. The patient is not nervous/anxious.     Past Medical History  Diagnosis Date  . Arthritis   . Hypertension   . Lumbar radicular pain 09/04/2014  . Fibromyalgia 09/04/2014     Family History  Problem Relation Age of Onset  . Cancer Other   . Hypertension Other   . Diabetes Other   . Aneurysm Father   . Bipolar disorder Brother   . Diabetes Brother      Social History  Social History  . Marital Status: Married    Spouse Name: [name]  . Number of Children: [name]  . Years of Education: [name]   Occupational History  . Not on file.   Social History Main Topics  . Smoking status: Former Smoker -- 0.75 packs/day for 24 years    Types: Cigarettes    Quit date: 11/03/1992  . Smokeless tobacco: Not on file  . Alcohol Use: Yes     Comment: occasionally    . Drug Use: No  . Sexual Activity: Not on file   Other Topics Concern  . Not on file   Social History Narrative     Allergies  Allergen Reactions  . Latex Itching, Swelling and Other (See Comments)    redness  . Sulfa Antibiotics Diarrhea and Nausea And Vomiting  . Aspirin Other (See Comments)    Can cause her to bleed out due to G6 PD  . Oxycodone Itching    "all over"  . Tramadol Other (See Comments)    Makes her feel "spaced out on street drugs"     Outpatient Prescriptions Prior to Visit  Medication Sig Dispense Refill  . acetaminophen (TYLENOL ARTHRITIS PAIN) 650 MG CR tablet Take 1,300 mg by mouth every 8 (eight) hours as needed for pain.    Marland Kitchen. albuterol (PROVENTIL HFA;VENTOLIN HFA) 108 (90 BASE) MCG/ACT inhaler Inhale into the lungs every 6 (six) hours as needed for wheezing or shortness of breath.    Marland Kitchen. azelastine (OPTIVAR) 0.05 % ophthalmic solution Place 1 drop into both eyes 2 (two) times daily.    . baclofen (LIORESAL) 20 MG tablet Take 1 tablet (20 mg total) by mouth 3 (three) times daily. 15 each 0  . beclomethasone (QVAR) 80 MCG/ACT inhaler Inhale 2 puffs into the lungs 2 (two) times daily. (Patient taking differently: Inhale 2 puffs into the lungs daily. ) 3 Inhaler 3  . cyclobenzaprine (FLEXERIL) 10 MG tablet Take 10 mg by mouth at bedtime.    Marland Kitchen. desloratadine (CLARINEX) 5 MG tablet Take 5 mg by mouth daily.    . DULoxetine (CYMBALTA) 30 MG capsule Take 30 mg by mouth daily.    . fluticasone (FLONASE) 50 MCG/ACT nasal spray Place 2 sprays into both nostrils 2 (two) times daily. 48 g 3  . gabapentin (NEURONTIN) 800 MG tablet Take 800 mg by mouth 3 (three) times daily.    . hydrochlorothiazide (HYDRODIURIL) 25 MG tablet Take 25 mg by mouth daily.     Marland Kitchen. HYDROcodone-acetaminophen (VICODIN) 5-500 MG per tablet Take 1 tablet by mouth every 6 (six) hours as needed for pain.    . montelukast (SINGULAIR) 10 MG tablet Take 10 mg by mouth at bedtime.    . naproxen (NAPROSYN)  500 MG tablet Take 1 tablet (500 mg total) by mouth 2 (two) times daily. 60 tablet 0  . nortriptyline (PAMELOR) 25 MG capsule Take 1 capsule (25 mg total) by mouth at bedtime. 30 capsule 0  . pantoprazole (PROTONIX) 40 MG tablet Take 40 mg by mouth daily.    . benzonatate (TESSALON) 200 MG capsule Take 1 capsule (200 mg total) by mouth 3 (three) times daily as needed for cough. (Patient not taking: Reported on 03/18/2016) 45 capsule 0  . diclofenac (FLECTOR) 1.3 % PTCH Place 1 patch onto the skin 2 (two) times daily. Reported on 03/18/2016    . PARoxetine Mesylate (BRISDELLE) 7.5 MG CAPS Take by mouth.    Marland Kitchen. PRESCRIPTION MEDICATION Place 2 sprays into both  nostrils daily.     No facility-administered medications prior to visit.        Objective:   Physical Exam Filed Vitals:   03/18/16 1159  BP: 126/82  Pulse: 94  Height: 5\' 9"  (1.753 m)  Weight: 223 lb (101.152 kg)  SpO2: 93%   Gen: Pleasant, well-nourished, in no distress,  normal affect  ENT: No lesions,  mouth clear,  oropharynx clear, no postnasal drip  Neck: No JVD, no TMG, no carotid bruits  Lungs: No use of accessory muscles,  clear without rales or rhonchi  Cardiovascular: RRR, heart sounds normal, no murmur or gallops, no peripheral edema  Musculoskeletal: No deformities, no cyanosis or clubbing  Neuro: alert, non focal  Skin: Warm, no lesions or rashes      Assessment & Plan:  Allergic rhinitis Continue Flonase, nasal saline washes, Clarinex. She has considered starting restarting immunotherapy although it's not clear that there are any targets defined on her skin testing  Intrinsic asthma I believe that most of her symptoms relate to upper airway irritation, rhinitis plus possible GERD. Her asthma appears to be fairly well-controlled on the current regimen.  Continue Qvar 2 puffs once a day Continue albuterol when necessary  GERD (gastroesophageal reflux disease) Continue pantoprazole

## 2016-03-18 NOTE — Assessment & Plan Note (Signed)
Continue pantoprazole. °

## 2016-03-18 NOTE — Patient Instructions (Signed)
Please continue current medications as you have been taking them Follow with Dr Delton CoombesByrum in 6 months or sooner if you have any problems

## 2016-03-18 NOTE — Assessment & Plan Note (Signed)
I believe that most of her symptoms relate to upper airway irritation, rhinitis plus possible GERD. Her asthma appears to be fairly well-controlled on the current regimen.  Continue Qvar 2 puffs once a day Continue albuterol when necessary

## 2016-03-19 LAB — TOXASSURE SELECT,+ANTIDEPR,UR

## 2016-03-19 NOTE — Progress Notes (Signed)
Urine drug screen for this encounter is consistent for prescribed medication 

## 2016-03-24 ENCOUNTER — Encounter: Payer: Self-pay | Admitting: Physical Medicine & Rehabilitation

## 2016-03-24 ENCOUNTER — Ambulatory Visit (HOSPITAL_BASED_OUTPATIENT_CLINIC_OR_DEPARTMENT_OTHER): Payer: 59 | Admitting: Physical Medicine & Rehabilitation

## 2016-03-24 VITALS — BP 116/73 | HR 97

## 2016-03-24 DIAGNOSIS — M5442 Lumbago with sciatica, left side: Secondary | ICD-10-CM

## 2016-03-24 DIAGNOSIS — G894 Chronic pain syndrome: Secondary | ICD-10-CM | POA: Diagnosis not present

## 2016-03-24 DIAGNOSIS — G8929 Other chronic pain: Secondary | ICD-10-CM

## 2016-03-24 DIAGNOSIS — M797 Fibromyalgia: Secondary | ICD-10-CM | POA: Diagnosis not present

## 2016-03-24 DIAGNOSIS — M5416 Radiculopathy, lumbar region: Secondary | ICD-10-CM | POA: Diagnosis not present

## 2016-03-24 NOTE — Progress Notes (Signed)
Acupuncture treatment #1 Indications chronic low back pain Chronic left lower extremity radicular pain Fibromyalgia syndrome  Patient placed prone on exam table, needles placed at bilateral BL 22 BL 23  BL 52, As well as DU4 Also needles placed at left KI3, left BL 61, left BL 40 left will stimulation between BL 22 BL 23 BL 52 as well as left KI3 left BL 40 Treatment time 30 minutes Patient tolerated procedures well

## 2016-03-24 NOTE — Patient Instructions (Signed)
Acupuncture  Acupuncture is a technique that is used in traditional Chinese medical treatment. Traditional Chinese medicine recognizes more than 2,000 points on the body that connect energy pathways (meridians) through the body. Acupuncture stimulates these points with needles that are inserted through your skin. The goal is to balance the physical, emotional, and mental energy in your body.  This treatment is done by a health care provider who has specialized training (licensed acupuncture practitioner). You may have this treatment for many reasons. For instance, many people have acupuncture to treat long-term or short-term pain. Others have acupuncture to treat conditions such as addiction, headaches, and arthritis. Some have it to help them recover from a stroke. Treatment often requires several acupuncture sessions. You may have acupuncture along with other medical treatments.  LET YOUR HEALTH CARE PROVIDER KNOW ABOUT:  · Any allergies you have.  · All medicines you are taking, including vitamins, herbs, eye drops, creams, and over-the-counter medicines.  · Any blood disorders you have.  · Previous surgeries you have had.  · Any medical conditions you may have.  RISKS AND COMPLICATIONS  Generally, this is a safe procedure. However, problems may occur, including:  · Skin infection.  · Damage to organs or structures beneath the skin.  BEFORE THE PROCEDURE  · Your acupuncture practitioner will ask about your medical history and your symptoms.  · You may have a physical exam.  PROCEDURE  · Your skin will be cleaned with a germ-killing (antiseptic) solution.  · Your acupuncture practitioner will open a new set of germ-free (sterile) needles.  · The needles will be inserted in your skin. They will be left in place for a certain length of time. You may feel slight pain or a tingling sensation.  · Your acupuncture practitioner may apply electrical energy to the needles.  · Your acupuncture practitioner may adjust the  needles in certain ways.  · After your procedure, the acupuncture practitioner will remove the needles, throw them away, and clean your skin.  The exact procedure that you have will depend on your condition and how your acupuncture provider treats it. The procedure may vary among health care providers.  AFTER THE PROCEDURE  · Keep all follow-up visits as directed by your health care provider. This is important.  · Let your acupuncture provider know if you have:    Soreness.    Skin redness or irritation.    Fever.     This information is not intended to replace advice given to you by your health care provider. Make sure you discuss any questions you have with your health care provider.     Document Released: 10/23/2003 Document Revised: 03/06/2015 Document Reviewed: 10/11/2014  Elsevier Interactive Patient Education ©2016 [employer]

## 2016-04-01 ENCOUNTER — Ambulatory Visit (HOSPITAL_BASED_OUTPATIENT_CLINIC_OR_DEPARTMENT_OTHER): Payer: 59 | Admitting: Physical Medicine & Rehabilitation

## 2016-04-01 ENCOUNTER — Encounter: Payer: Self-pay | Admitting: Physical Medicine & Rehabilitation

## 2016-04-01 VITALS — BP 165/92 | HR 86 | Resp 14

## 2016-04-01 DIAGNOSIS — G8929 Other chronic pain: Secondary | ICD-10-CM | POA: Diagnosis not present

## 2016-04-01 DIAGNOSIS — M5442 Lumbago with sciatica, left side: Secondary | ICD-10-CM | POA: Diagnosis not present

## 2016-04-01 DIAGNOSIS — M797 Fibromyalgia: Secondary | ICD-10-CM | POA: Diagnosis not present

## 2016-04-01 DIAGNOSIS — G894 Chronic pain syndrome: Secondary | ICD-10-CM | POA: Diagnosis not present

## 2016-04-01 DIAGNOSIS — M5416 Radiculopathy, lumbar region: Secondary | ICD-10-CM | POA: Diagnosis not present

## 2016-04-01 NOTE — Progress Notes (Signed)
Acupuncture treatment #2 Indications chronic low back pain Chronic left lower extremity radicular pain Fibromyalgia syndrome  Patient placed prone on exam table, needles placed at bilateral BL 22 BL 23  BL 52, As well as DU4 Also needles placed at left KI3, left BL 61, left BL 40 left will stimulation between BL 22 BL 23 BL 52 as well as left KI3 left BL 40 Treatment time 30 minutes Patient tolerated procedures well

## 2016-04-07 ENCOUNTER — Encounter: Payer: 59 | Attending: Physical Medicine & Rehabilitation

## 2016-04-07 ENCOUNTER — Encounter: Payer: Self-pay | Admitting: Physical Medicine & Rehabilitation

## 2016-04-07 ENCOUNTER — Ambulatory Visit (HOSPITAL_BASED_OUTPATIENT_CLINIC_OR_DEPARTMENT_OTHER): Payer: 59 | Admitting: Physical Medicine & Rehabilitation

## 2016-04-07 VITALS — BP 136/83 | HR 82 | Resp 14

## 2016-04-07 DIAGNOSIS — M533 Sacrococcygeal disorders, not elsewhere classified: Secondary | ICD-10-CM | POA: Insufficient documentation

## 2016-04-07 DIAGNOSIS — M4716 Other spondylosis with myelopathy, lumbar region: Secondary | ICD-10-CM | POA: Diagnosis not present

## 2016-04-07 DIAGNOSIS — M5442 Lumbago with sciatica, left side: Secondary | ICD-10-CM

## 2016-04-07 DIAGNOSIS — M545 Low back pain: Secondary | ICD-10-CM | POA: Diagnosis not present

## 2016-04-07 DIAGNOSIS — G8929 Other chronic pain: Secondary | ICD-10-CM | POA: Diagnosis not present

## 2016-04-07 DIAGNOSIS — M5441 Lumbago with sciatica, right side: Secondary | ICD-10-CM

## 2016-04-07 DIAGNOSIS — Z79899 Other long term (current) drug therapy: Secondary | ICD-10-CM | POA: Insufficient documentation

## 2016-04-07 DIAGNOSIS — Z5181 Encounter for therapeutic drug level monitoring: Secondary | ICD-10-CM | POA: Insufficient documentation

## 2016-04-07 DIAGNOSIS — M797 Fibromyalgia: Secondary | ICD-10-CM

## 2016-04-07 DIAGNOSIS — G894 Chronic pain syndrome: Secondary | ICD-10-CM | POA: Insufficient documentation

## 2016-04-07 NOTE — Progress Notes (Signed)
Acupuncture treatment #3 Indications chronic low back pain Chronic left lower extremity radicular pain Fibromyalgia syndrome  Patient placed prone on exam table, needles placed at bilateral BL 22 BL 23  BL 52, As well as DU4 Also needles placed at Bilateral KI3, left BL 61, left BL 40 left wilth 2 Hz stimulation between BL 22 BL 23 BL 52 as well as Bilateral KI3 left BL 40 Also needle placed at DU 20 as well as bilateral LI 4 Treatment time 30 minutes Patient tolerated procedures well

## 2016-04-09 DIAGNOSIS — R1310 Dysphagia, unspecified: Secondary | ICD-10-CM | POA: Insufficient documentation

## 2016-04-09 DIAGNOSIS — Z780 Asymptomatic menopausal state: Secondary | ICD-10-CM | POA: Insufficient documentation

## 2016-04-09 DIAGNOSIS — I10 Essential (primary) hypertension: Secondary | ICD-10-CM | POA: Insufficient documentation

## 2016-04-09 DIAGNOSIS — F32A Depression, unspecified: Secondary | ICD-10-CM | POA: Insufficient documentation

## 2016-04-09 DIAGNOSIS — E079 Disorder of thyroid, unspecified: Secondary | ICD-10-CM | POA: Insufficient documentation

## 2016-04-09 DIAGNOSIS — F329 Major depressive disorder, single episode, unspecified: Secondary | ICD-10-CM | POA: Insufficient documentation

## 2016-04-15 ENCOUNTER — Encounter: Payer: Self-pay | Admitting: Physical Medicine & Rehabilitation

## 2016-04-15 ENCOUNTER — Ambulatory Visit (HOSPITAL_BASED_OUTPATIENT_CLINIC_OR_DEPARTMENT_OTHER): Payer: 59 | Admitting: Physical Medicine & Rehabilitation

## 2016-04-15 VITALS — BP 121/83 | HR 94 | Resp 14

## 2016-04-15 DIAGNOSIS — G8929 Other chronic pain: Secondary | ICD-10-CM

## 2016-04-15 DIAGNOSIS — M5442 Lumbago with sciatica, left side: Secondary | ICD-10-CM | POA: Diagnosis not present

## 2016-04-15 DIAGNOSIS — G894 Chronic pain syndrome: Secondary | ICD-10-CM | POA: Diagnosis not present

## 2016-04-15 NOTE — Patient Instructions (Signed)
No restrictions If ENT does not find any sinus problems would recommend follow-up with neurology

## 2016-04-15 NOTE — Progress Notes (Signed)
Acupuncture treatment #4 Indications chronic low back pain Chronic left lower extremity radicular pain Fibromyalgia syndrome  Patient placed prone on exam table, needles placed at bilateral BL 22 BL 23  BL 52, As well as DU4 Also needles placed at Bilateral KI3,  BL 61,BL 40 left wilth 2 Hz stimulation between BL 22 BL 23 BL 52 as well as Bilateral KI3 left BL 40 Also needle placed at DU 20 as well as bilateral LI 4 Treatment time 30 minutes Patient tolerated procedures well

## 2016-04-21 ENCOUNTER — Ambulatory Visit: Payer: 59 | Admitting: Nurse Practitioner

## 2016-04-21 ENCOUNTER — Encounter: Payer: 59 | Admitting: Nurse Practitioner

## 2016-04-21 ENCOUNTER — Encounter: Payer: Self-pay | Admitting: Nurse Practitioner

## 2016-04-21 ENCOUNTER — Ambulatory Visit (INDEPENDENT_AMBULATORY_CARE_PROVIDER_SITE_OTHER): Payer: 59 | Admitting: Nurse Practitioner

## 2016-04-21 VITALS — BP 124/79 | HR 94 | Ht 69.0 in | Wt 216.0 lb

## 2016-04-21 DIAGNOSIS — G8929 Other chronic pain: Secondary | ICD-10-CM

## 2016-04-21 DIAGNOSIS — Z8669 Personal history of other diseases of the nervous system and sense organs: Secondary | ICD-10-CM | POA: Diagnosis not present

## 2016-04-21 DIAGNOSIS — M5416 Radiculopathy, lumbar region: Secondary | ICD-10-CM

## 2016-04-21 DIAGNOSIS — R519 Headache, unspecified: Secondary | ICD-10-CM

## 2016-04-21 DIAGNOSIS — M5442 Lumbago with sciatica, left side: Secondary | ICD-10-CM | POA: Diagnosis not present

## 2016-04-21 DIAGNOSIS — M797 Fibromyalgia: Secondary | ICD-10-CM

## 2016-04-21 DIAGNOSIS — R51 Headache: Secondary | ICD-10-CM | POA: Diagnosis not present

## 2016-04-21 MED ORDER — NORTRIPTYLINE HCL 25 MG PO CAPS: 50.0000 mg | ORAL_CAPSULE | Freq: Every day | ORAL | Status: DC

## 2016-04-21 MED ORDER — ONDANSETRON HCL 4 MG PO TABS: 4.0000 mg | ORAL_TABLET | Freq: Three times a day (TID) | ORAL | Status: AC | PRN

## 2016-04-21 NOTE — Patient Instructions (Signed)
Zofran 4 mg when necessary nausea Increase nortriptyline to 2 tablets at bedtime for headache prevention Importance of keeping a diary if headaches worsen to include the time of the headache what you're doing any other specific information that would be useful.  Discussed stress relief techniques such as deep breathing muscle relaxation mental relaxation to music.  Discussed  regular meals  and sleep. Sleep deprivation can be a migraine trigger Follow-up in 2 months

## 2016-04-21 NOTE — Progress Notes (Signed)
[facility]  PATIENT: Ashley [name] DOB: 12-16-1955   REASON FOR VISIT: Follow-up for lumbar radicular pain, fibromyalgia, headaches HISTORY FROM: Patient    HISTORY OF PRESENT ILLNESS:UPDATE 04/18/16 CM Ms. Ashley [name],60 year old female returns for follow-up. She was last seen in the office by Dr. Anne Hahn November 2015. She was seen at that time for Lumbar radicular pain,  MRI lumbar spine showed scoliosis degenerative changes at L4-L5 and L5-S1with prior surgery on the left side , no significant spinal stenosis neuro foraminal stenosis was seen. EMG nerve conduction both lower extremities without evidence of overlying lumbosacral radiculopathy.  Pt having headaches now,seen in urgent care given migraine cocktail week ago a prednisone Dosepak.  Just finish prednisone last week.She reports she has a history of migraines years ago . 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 She can have pain over one eye, throbbing sensation nausea and vomiting and is sensitive to light and sound. She is not aware of any specific foods. She also has history of fibromyalgia and is currently receiving acupuncture By Dr. Larna Daughters.  She returns for reevaluation  HISTORY 11/2/15[name] is a 60 year old right-handed black female with a history of low back pain. The patient has had prior lumbosacral spine surgery in the past, but in July 2015, the patient underwent arthroscopic knee surgery on the right knee, and shortly thereafter began having back pain radiating down the right leg. The patient indicates that the pain goes down the lateral aspect of the right thigh, and into the gastrocnemius muscle, and then across the top of the foot, so she was some numbness and tingly sensations in this distribution. She believes that the right leg  is weaker. She denies any falls, but she has had some stumbles. She does have a chronic issue with control of the bowels and bladder, but this is not a new problem for her. She does have a history of fibromyalgia. She reports some neck discomfort and left shoulder discomfort. She is on gabapentin for her fibromyalgia pain. She was seen by Dr. Newell Coral, and MRI evaluation of the lumbosacral spine was done, and apparently did not show evidence of nerve root impingement on the right. This study is not available for my review. She is sent to this office for an evaluation. She is currently in physical therapy for her knee and for her back, and this has offered some benefit.  REVIEW OF SYSTEMS: Full 14 system review of systems performed and notable only for those listed, all others are neg:  Constitutional: fatigue Cardiovascular: neg Ear/Nose/Throat: neg  Skin: neg Eyes: light sensitivity ,blurred vision Respiratory: neg Gastroitestinal:  nausea vomiting Hematology/Lymphatic: neg  Endocrine: neg Musculoskeletal: back pain neck pain Allergy/Immunology: neg Neurological:  Headache dizziness Psychiatric: neg Sleep : neg   ALLERGIES: Allergies  Allergen Reactions  . Latex Itching, Swelling and Other (See Comments)    redness  . Sulfa Antibiotics Diarrhea and Nausea And Vomiting  . Aspirin Other (See Comments)    Can cause her to bleed out due to G6 PD  . Oxycodone Itching    "all over"  . Tramadol Other (See Comments)    Makes her feel "spaced out on street drugs"    HOME MEDICATIONS: Outpatient Prescriptions Prior to Visit  Medication Sig Dispense Refill  . acetaminophen-codeine (TYLENOL #3) 300-30 MG  tablet Take by mouth 2 (two) times daily as needed for moderate pain.    Marland Kitchen. albuterol (PROVENTIL HFA;VENTOLIN HFA) 108 (90 BASE) MCG/ACT inhaler Inhale into the lungs every 6 (six) hours as needed for wheezing or shortness of breath.    Marland Kitchen. azelastine (OPTIVAR) 0.05 % ophthalmic solution  Place 1 drop into both eyes 2 (two) times daily.    . baclofen (LIORESAL) 20 MG tablet Take 1 tablet (20 mg total) by mouth 3 (three) times daily. 15 each 0  . beclomethasone (QVAR) 80 MCG/ACT inhaler Inhale 2 puffs into the lungs 2 (two) times daily. (Patient taking differently: Inhale 2 puffs into the lungs daily. ) 3 Inhaler 3  . benzonatate (TESSALON) 200 MG capsule Take 1 capsule (200 mg total) by mouth 3 (three) times daily as needed for cough. 45 capsule 0  . cyclobenzaprine (FLEXERIL) 10 MG tablet Take 10 mg by mouth at bedtime.    Marland Kitchen. desloratadine (CLARINEX) 5 MG tablet Take 5 mg by mouth daily.    . DULoxetine (CYMBALTA) 60 MG capsule Take 60 mg by mouth at bedtime.    . fluticasone (FLONASE) 50 MCG/ACT nasal spray Place 2 sprays into both nostrils 2 (two) times daily. 48 g 3  . gabapentin (NEURONTIN) 800 MG tablet Take 800 mg by mouth 3 (three) times daily.    . hydrochlorothiazide (HYDRODIURIL) 25 MG tablet Take 25 mg by mouth daily.     . montelukast (SINGULAIR) 10 MG tablet Take 10 mg by mouth at bedtime.    . naproxen (NAPROSYN) 500 MG tablet Take 1 tablet (500 mg total) by mouth 2 (two) times daily. 60 tablet 0  . nortriptyline (PAMELOR) 25 MG capsule Take 1 capsule (25 mg total) by mouth at bedtime. 30 capsule 0  . pantoprazole (PROTONIX) 40 MG tablet Take 40 mg by mouth daily.     No facility-administered medications prior to visit.    PAST MEDICAL HISTORY: Past Medical History  Diagnosis Date  . Arthritis   . Hypertension   . Lumbar radicular pain 09/04/2014  . Fibromyalgia 09/04/2014  . Headache     PAST SURGICAL HISTORY: Past Surgical History  Procedure Laterality Date  . Esophageal manometry [name] 11/21/2013    Procedure: ESOPHAGEAL MANOMETRY (EM);  Surgeon: Shirley FriarVincent C. Schooler, MD;  Location: [facility];  Service: Endoscopy;  Laterality: [name];  . Breast surgery    . Abdominal hysterectomy    . Knee arthroscopy w/ meniscal repair    . Bunionectomy    . Nasal  sinus surgery    . Lumbar laminectomy      FAMILY HISTORY: Family History  Problem Relation Age of Onset  . Cancer Other   . Hypertension Other   . Diabetes Other   . Aneurysm Father   . Bipolar disorder Brother   . Diabetes Brother   . Migraines Mother   . Migraines Daughter     SOCIAL HISTORY: Social History   Social History  . Marital Status: Married    Spouse Name: [name]  . Number of Children: [name]  . Years of Education: [name]   Occupational History  . Not on file.   Social History Main Topics  . Smoking status: Former Smoker -- 0.75 packs/day for 24 years    Types: Cigarettes    Quit date: 11/03/1992  . Smokeless tobacco: Not on file  . Alcohol Use: 0.6 oz/week    1 Glasses of wine per week     Comment: occasionally  . Drug  Use: No  . Sexual Activity: Not on file   Other Topics Concern  . Not on file   Social History Narrative     PHYSICAL EXAM  Filed Vitals:   04/21/16 1407  BP: 124/79  Pulse: 94  Height:  (1.753 m)  Weight: 216 lb (97.977 kg)   Body mass index is 31.88 kg/(m^2). General: The patient is alert and cooperative at the time of the examination. Neck: The neck is supple, no carotid bruits are noted. Cardiovascular: The cardiovascular examination reveals a regular rate and rhythm, no obvious murmurs or rubs are noted. Neuromuscular: The patient has good range of movement of the lumbosacral spine.  Skin: Extremities are without significant edema.  Neurologic Exam  Mental status: The patient is alert and oriented x 3 at the time of the examination. The patient has apparent normal recent and remote memory, with an apparently normal attention span and concentration ability.  Cranial nerves: Facial symmetry is present. There is good sensation of the face to pinprick and soft touch bilaterally. The strength of the facial muscles and the muscles to head turning and shoulder shrug are normal bilaterally. Speech is well enunciated, no aphasia  or dysarthria is noted. Pupils are equal, round, and reactive to light. Discs are flat bilaterally.Extraocular movements are full. Visual fields are full. The tongue is midline, and the patient has symmetric elevation of the soft palate. No obvious hearing deficits are noted. Motor: The motor testing reveals 5 over 5 strength of all 4 extremities. Good symmetric motor tone is noted throughout. The patient is able to walk on heels and the toes bilaterally. Sensory: Sensory testing is intact to pinprick, soft touch, vibration sensation, and position sense on all 4 extremities. No evidence of extinction is noted. Coordination: Cerebellar testing reveals good finger-nose-finger and heel-to-shin bilaterally. Gait and station: Gait is normal. Tandem gait is normal. Romberg is negative. No drift is seen. Reflexes: Deep tendon reflexes are symmetric, but are depressed bilaterally. Toes are downgoing bilaterally.   DIAGNOSTIC DATA (LABS, IMAGING, TESTING) - ASSESSMENT AND PLAN  60 y.o. year old female  has a past medical history of Arthritis; Hypertension; Lumbar radicular pain (09/04/2014); Fibromyalgia (09/04/2014); and Headache. here follow-up recurrent headaches  PLAN:Zofran 4 mg when necessary nausea Increase nortriptyline to 2 tablets at bedtime for headache prevention Importance of keeping a diary if headaches worsen to include the time of the headache what you're doing any other specific information that would be useful.  Discussed stress relief techniques such as deep breathing muscle relaxation mental relaxation to music.  Discussed exercise  regular meals  and sleep. Sleep deprivation can be a migraine trigger Given a list of foods that are migraine triggers Follow-up in 2 months Nilda Riggs, [facility], [employer] [facility], APRN  [facility] [address] [place], [place] [address] [phone]

## 2016-04-21 NOTE — Progress Notes (Signed)
I have read the note, and I agree with the clinical assessment and plan.  Ashley [name],Ashley [name]   

## 2016-05-01 ENCOUNTER — Telehealth: Payer: Self-pay | Admitting: Emergency Medicine

## 2016-05-01 DIAGNOSIS — J4520 Mild intermittent asthma, uncomplicated: Secondary | ICD-10-CM

## 2016-05-01 MED ORDER — ALBUTEROL SULFATE (2.5 MG/3ML) 0.083% IN NEBU: 2.5000 mg | INHALATION_SOLUTION | RESPIRATORY_TRACT | Status: DC | PRN

## 2016-05-01 NOTE — Telephone Encounter (Signed)
She can start albuterol nebs q4h prn for SOB or wheeze. Alternatively she could use her albuterol HFA

## 2016-05-01 NOTE — Telephone Encounter (Signed)
Per 03/18/16 OV: Patient Instructions       Please continue current medications as you have been taking them Follow with Dr Delton CoombesByrum in 6 months or sooner if you have any problems  --  Spoke with pt. She c/o increase SOB, wheezing (mainly at night) x couple days. She reports her AC currently does not work at home. She is requesting to be set up with nebulizer and treatments to help with breathing. Please advise RB thanks

## 2016-05-01 NOTE — Telephone Encounter (Signed)
Spoke with pt and advised of RB's recommendations. Pt states that she has been using albuterol HFA with some relief but feels that nebs would help her more. Pt advised to contact office if still having issues after starting neb treatments. Orders placed. Nothing further needed.

## 2016-05-07 ENCOUNTER — Telehealth: Payer: Self-pay | Admitting: Emergency Medicine

## 2016-05-07 DIAGNOSIS — J4520 Mild intermittent asthma, uncomplicated: Secondary | ICD-10-CM

## 2016-05-07 NOTE — Telephone Encounter (Signed)
Did not see order in pt's chart for nebulizer. New order placed and marked urgent. Advised pt that DME should contact her within next few days. Nothing further needed.

## 2016-05-12 ENCOUNTER — Telehealth: Payer: Self-pay | Admitting: Emergency Medicine

## 2016-05-12 NOTE — Telephone Encounter (Signed)
Called spoke with Marchelle FolksAmanda from [facility]. She did receive the signed order and is submitting this for auth. ADvised pt needs this today.  Called made pt aware. Nothing further needed

## 2016-05-12 NOTE — Telephone Encounter (Signed)
Orders signed - confirm that they take care of today thanks

## 2016-05-12 NOTE — Telephone Encounter (Signed)
Called spoke with Marchelle FolksAmanda from [facility]. They are needing the order in epic signed by RB. Pt is needing this ASAP to be done. I have paged RB to have him sign the order. Please advise thanks

## 2016-05-16 ENCOUNTER — Encounter: Payer: 59 | Attending: Physical Medicine & Rehabilitation

## 2016-05-16 ENCOUNTER — Encounter: Payer: Self-pay | Admitting: Physical Medicine & Rehabilitation

## 2016-05-16 ENCOUNTER — Ambulatory Visit (HOSPITAL_BASED_OUTPATIENT_CLINIC_OR_DEPARTMENT_OTHER): Payer: 59 | Admitting: Physical Medicine & Rehabilitation

## 2016-05-16 VITALS — BP 138/89 | HR 84

## 2016-05-16 DIAGNOSIS — M5442 Lumbago with sciatica, left side: Secondary | ICD-10-CM

## 2016-05-16 DIAGNOSIS — M47816 Spondylosis without myelopathy or radiculopathy, lumbar region: Secondary | ICD-10-CM | POA: Diagnosis not present

## 2016-05-16 DIAGNOSIS — G894 Chronic pain syndrome: Secondary | ICD-10-CM | POA: Insufficient documentation

## 2016-05-16 DIAGNOSIS — Z5181 Encounter for therapeutic drug level monitoring: Secondary | ICD-10-CM | POA: Insufficient documentation

## 2016-05-16 DIAGNOSIS — M797 Fibromyalgia: Secondary | ICD-10-CM | POA: Insufficient documentation

## 2016-05-16 DIAGNOSIS — M545 Low back pain: Secondary | ICD-10-CM | POA: Diagnosis not present

## 2016-05-16 DIAGNOSIS — M533 Sacrococcygeal disorders, not elsewhere classified: Secondary | ICD-10-CM | POA: Insufficient documentation

## 2016-05-16 DIAGNOSIS — Z79899 Other long term (current) drug therapy: Secondary | ICD-10-CM | POA: Insufficient documentation

## 2016-05-16 DIAGNOSIS — G8929 Other chronic pain: Secondary | ICD-10-CM

## 2016-05-16 DIAGNOSIS — M4716 Other spondylosis with myelopathy, lumbar region: Secondary | ICD-10-CM | POA: Diagnosis not present

## 2016-05-16 MED ORDER — PREGABALIN 75 MG PO CAPS: 75.0000 mg | ORAL_CAPSULE | Freq: Three times a day (TID) | ORAL | Status: DC

## 2016-05-16 NOTE — Progress Notes (Signed)
Subjective:    Patient ID: Ashley [name], female    DOB: June 21, 1956, 60 y.o.   MRN: [id number]  HPI Patient returns today with multiple pain complaints. She has her fibromyalgia pain and states that she has not tried Lyrica in the past because of financial issues with her insurance. She is maintained on gabapentin 800 mg 3 times per day. In addition, she has Cymbalta 60 mg per day. She is getting workup through orthopedic spine surgery for her low back pain. She states that her pain goes into her groin as well as into her buttocks area and also sometimes goes below her knees into her left foot. She does have pain that radiates from her back into her thighs. She states that when she had sacroiliac radiofrequency ablation by Dr. Regino SchultzeWang, her pain shooting into her foot got better, but her buttock pain did not. We discussed that this is an  atypical response Pain Inventory Average Pain 8 Pain Right Now 8 My pain is sharp, dull, stabbing, tingling and aching  In the last 24 hours, has pain interfered with the following? General activity 10 Relation with others 9 Enjoyment of life 9 What TIME of day is your pain at its worst? daytime evening and night Sleep (in general) NA  Pain is worse with: walking, bending and sitting Pain improves with: rest, heat/ice and pacing activities Relief from Meds: na  Mobility use a cane how many minutes can you walk? 2-3 ability to climb steps?  yes do you drive?  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  no  Physicians involved in your care Any changes since last visit?  no   Family History  Problem Relation Age of Onset  . Cancer Other   . Hypertension Other   .  Diabetes Other   . Aneurysm Father   . Bipolar disorder Brother   . Diabetes Brother   . Migraines Mother   . Migraines Daughter    Social History   Social History  . Marital Status: Married    Spouse Name: [name]  . Number of Children: [name]  . Years of Education: [name]   Social History Main Topics  . Smoking status: Former Smoker -- 0.75 packs/day for 24 years    Types: Cigarettes    Quit date: 11/03/1992  . Smokeless tobacco: None  . Alcohol Use: 0.6 oz/week    1 Glasses of wine per week     Comment: occasionally  . Drug Use: No  . Sexual Activity: Not Asked   Other Topics Concern  . None   Social History Narrative   Past Surgical History  Procedure Laterality Date  . Esophageal manometry [name] 11/21/2013    Procedure: ESOPHAGEAL MANOMETRY (EM);  Surgeon: Shirley FriarVincent C. Schooler, MD;  Location: [facility];  Service: Endoscopy;  Laterality: [name];  . Breast surgery    . Abdominal hysterectomy    . Knee arthroscopy w/ meniscal repair    . Bunionectomy    . Nasal sinus surgery    . Lumbar laminectomy     Past Medical History  Diagnosis Date  . Arthritis   . Hypertension   . Lumbar radicular pain 09/04/2014  . Fibromyalgia 09/04/2014  . Headache    BP 138/89 mmHg  Pulse  84  SpO2 97%  Opioid Risk Score:   Fall Risk Score:  `1  Depression screen PHQ 2/9  Depression screen [facility] 2/9 03/10/2016 06/29/2014  Decreased Interest 3 0  Down, Depressed, Hopeless 2 0  PHQ - 2 Score 5 0  Altered sleeping 3 -  Tired, decreased energy 3 -  Change in appetite 3 -  Feeling bad or failure about yourself  0 -  Trouble concentrating 3 -  Moving slowly or fidgety/restless 3 -  Suicidal thoughts 0 -  PHQ-9 Score 20 -  Difficult doing work/chores Extremely dIfficult -     Review of Systems  All other systems reviewed and are negative.      Objective:   Physical Exam  Constitutional: She appears well-developed and well-nourished.  HENT:  Head: Normocephalic and atraumatic.    Eyes: Conjunctivae and EOM are normal. Pupils are equal, round, and reactive to light.  Neck: Normal range of motion.  Psychiatric: She has a normal mood and affect.  Nursing note and vitals reviewed.  Patient has pain with lumbar extension but not with lumbar flexion. Patient without tenderness to palpation in the cervical or thoracic spine or in the lumbar spine to palpation. She has some tenderness across the trochanteric bursa area. She does have some pain with flexion and extension of the lumbar spine area.      Assessment & Plan:  1. Chronic pain syndrome, multifactorial. She does have an underlying problem of fibromyalgia which amplifies all of her pain. We discussed a trial of Lyrica. She will see whether it gets covered by her insurance. We'll start 75 mg 3 times a day, she can start this and discontinue the Neurontin. In addition, she will follow-up with Dr. Regino Schultze, for lumbar medial branch blocks, she wishes to do these, however, requests IV sedation for the procedure, which is not done in this office.  In addition, she will follow-up with Dr. Bunnie Domino ski for further evaluation of her back pain which radiates into her left lower extremity

## 2016-05-16 NOTE — Patient Instructions (Signed)
Bilateral lumbar L3,4,5 medial branch blocks If these give a short term relief then may benefit from lumbar radiofrequency

## 2016-06-13 ENCOUNTER — Encounter: Payer: 59 | Attending: Physical Medicine & Rehabilitation

## 2016-06-13 ENCOUNTER — Encounter: Payer: Self-pay | Admitting: Physical Medicine & Rehabilitation

## 2016-06-13 ENCOUNTER — Ambulatory Visit (HOSPITAL_BASED_OUTPATIENT_CLINIC_OR_DEPARTMENT_OTHER): Payer: 59 | Admitting: Physical Medicine & Rehabilitation

## 2016-06-13 VITALS — BP 148/93 | HR 84 | Resp 17

## 2016-06-13 DIAGNOSIS — M4716 Other spondylosis with myelopathy, lumbar region: Secondary | ICD-10-CM | POA: Diagnosis not present

## 2016-06-13 DIAGNOSIS — M5442 Lumbago with sciatica, left side: Secondary | ICD-10-CM

## 2016-06-13 DIAGNOSIS — Z79899 Other long term (current) drug therapy: Secondary | ICD-10-CM | POA: Insufficient documentation

## 2016-06-13 DIAGNOSIS — M545 Low back pain: Secondary | ICD-10-CM | POA: Diagnosis not present

## 2016-06-13 DIAGNOSIS — M797 Fibromyalgia: Secondary | ICD-10-CM

## 2016-06-13 DIAGNOSIS — Z5181 Encounter for therapeutic drug level monitoring: Secondary | ICD-10-CM | POA: Diagnosis not present

## 2016-06-13 DIAGNOSIS — M533 Sacrococcygeal disorders, not elsewhere classified: Secondary | ICD-10-CM | POA: Insufficient documentation

## 2016-06-13 DIAGNOSIS — G894 Chronic pain syndrome: Secondary | ICD-10-CM | POA: Insufficient documentation

## 2016-06-13 DIAGNOSIS — G8929 Other chronic pain: Secondary | ICD-10-CM

## 2016-06-13 NOTE — Progress Notes (Signed)
Subjective:    Patient ID: Ashley [name], female    DOB: 1956/03/03, 60 y.o.   MRN: [id number]  HPI Patient returns today with chief complaint of drowsiness. We went over her medication list, her meds are prescribed by other physicians and include gabapentin, Cymbalta, nortriptyline, Lexapro, baclofen. We discussed that all these medications can cause drowsiness. We discussed that she could reduce doses, but she does not wish to  Overall, she is quite [facility] She feels like she can move better and with less pain. Pain Inventory Average Pain 8 Pain Right Now 8 My pain is dull, tingling and aching  In the last 24 hours, has pain interfered with the following? General activity 5 Relation with others 6 Enjoyment of life 6 What TIME of day is your pain at its worst? daytime, evening, night Sleep (in general) Poor  Pain is worse with: walking, sitting and standing Pain improves with: NA Relief from Meds: 3  Mobility use a cane ability to climb steps?  yes do you drive?  yes Do you have any goals in this area?  yes  Function employed # of hrs/week 6  Neuro/Psych tingling spasms confusion  Prior Studies Any changes since last visit?  no  Physicians involved in your care Any changes since last visit?  no   Family History  Problem Relation Age of Onset  . Aneurysm Father   . Bipolar disorder Brother   . Diabetes Brother   . Migraines Mother   . Migraines Daughter   . Cancer Other   . Hypertension Other   . Diabetes Other    Social History   Social History  . Marital status: Married    Spouse name: [name]  . Number of children: [name]  . Years of education: [name]   Social History Main Topics  . Smoking status: Former Smoker    Packs/day: 0.75    Years: 24.00    Types: Cigarettes    Quit date: 11/03/1992  . Smokeless tobacco: Never Used  . Alcohol use 0.6 oz/week    1 Glasses of wine per week    Comment: occasionally  . Drug use: No  . Sexual activity: Not Asked   Other Topics Concern  . None   Social History Narrative  . None   Past Surgical History:  Procedure Laterality Date  . ABDOMINAL HYSTERECTOMY    . BREAST SURGERY    . BUNIONECTOMY    . ESOPHAGEAL MANOMETRY [name] 11/21/2013   Procedure: ESOPHAGEAL MANOMETRY (EM);  Surgeon: Ashley FriarVincent C. Schooler, MD;  Location: [facility];  Service: Endoscopy;  Laterality: [name];  . KNEE ARTHROSCOPY W/ MENISCAL REPAIR    . LUMBAR LAMINECTOMY    . NASAL SINUS SURGERY     Past Medical History:  Diagnosis Date  . Arthritis   . Fibromyalgia 09/04/2014  . Headache   . Hypertension   . Lumbar radicular pain 09/04/2014   BP (!) 148/93   Pulse 84   Resp 17   SpO2 95%   Opioid Risk Score:   Fall Risk Score:  `1  Depression screen PHQ 2/9  Depression screen [facility] PHQ 2/9 03/10/2016 06/29/2014  Decreased Interest 3 0  Down, Depressed, Hopeless 2 0  PHQ - 2 Score 5 0  Altered sleeping 3 -  Tired, decreased energy 3 -  Change in appetite 3 -  Feeling bad or failure about yourself  0 -  Trouble concentrating 3 -  Moving slowly  or fidgety/restless 3 -  Suicidal thoughts 0 -  PHQ-9 Score 20 -  Difficult doing work/chores Extremely dIfficult -    Review of Systems  Constitutional: Positive for diaphoresis.  Cardiovascular:       Limb swelling   Gastrointestinal: Positive for constipation.  Neurological:       Tingling  Spasms   Psychiatric/Behavioral: Positive for confusion.  All other systems reviewed and are negative.      Objective:   Physical Exam  Ashley [name] has mild tenderness palpation bilateral upper traps. She has full muscle strength in bilateral deltoid, bicep, triceps, grip as well as hip flexors, knee extensor, ankle dorsal flexor and plantar flexor. Her lumbar range of motion has improved proximally 75% range with flexion, extension, lateral bending and rotation. She has less pain during range of motion  activities.      Assessment & Plan:  1. Fibromyalgia syndrome. I think this is her underlying problem. She is improving with a physical therapy program through [facility]. In addition, she does have sacroiliac disorder. Has benefited from sacroiliac ablation in the past, I think she would benefit from repeat. However, she does not wish to pursue this at the current time. We discussed her medication management and that most of her medications have drowsiness side effects. We discussed that she could actually go down on her doses, but she does not wish to do this at the current time. Of note, she actually takes nortriptyline 25 mg rather than 50 mg as prescribed daily at bedtime in addition to 60 mg of Cymbalta daily at bedtime, she also takes Flexeril 10 mg daily at bedtime She also takes Tylenol 3 twice a day. All these medications are prescribed by her other physicians. She also takes baclofen 20 mg 3 times per day  Other potential treatment options include percutaneous electrical nerve stimulation. She has not tried transcutaneous electrical nerve stimulation not have advised her to try this first, either over-the-counter or in conjunction with her physical therapy

## 2016-06-13 NOTE — Patient Instructions (Signed)
You may try a TENS unit either over-the-counter or ask your therapist to try one during physical therapy sessions.

## 2016-06-24 ENCOUNTER — Encounter: Payer: Self-pay | Admitting: Nurse Practitioner

## 2016-06-24 ENCOUNTER — Ambulatory Visit (INDEPENDENT_AMBULATORY_CARE_PROVIDER_SITE_OTHER): Payer: 59 | Admitting: Nurse Practitioner

## 2016-06-24 VITALS — BP 131/84 | HR 79 | Ht 69.0 in | Wt 218.8 lb

## 2016-06-24 DIAGNOSIS — M797 Fibromyalgia: Secondary | ICD-10-CM

## 2016-06-24 DIAGNOSIS — R51 Headache: Secondary | ICD-10-CM

## 2016-06-24 DIAGNOSIS — Z8669 Personal history of other diseases of the nervous system and sense organs: Secondary | ICD-10-CM | POA: Diagnosis not present

## 2016-06-24 DIAGNOSIS — R519 Headache, unspecified: Secondary | ICD-10-CM

## 2016-06-24 MED ORDER — TOPIRAMATE 25 MG PO TABS: 25.0000 mg | ORAL_TABLET | Freq: Two times a day (BID) | ORAL | 4 refills | Status: DC

## 2016-06-24 NOTE — Progress Notes (Signed)
[facility]  [name]: Ashley [name] DOB: [birth year]   REASON FOR VISIT: Follow-up for lumbar radicular pain, fibromyalgia and headaches HISTORY FROM: [name]    HISTORY OF PRESENT ILLNESS: UPDATE 08/22/2017[name], 60 year old [name] returns for follow-up. She has a history of migraine headaches and is currently having one to 2 headaches per week mostly due to stress, lack of sleep and weather changes. 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 Her nortriptyline was increased at her last visit but she was too hung over in Ashley next day so she went back to taking 25 mg at bedtime. She also has history of fibromyalgia and is on baclofen Cymbalta and Neurontin. She continues to receive acupuncture by Dr. Trecia RogersKirsten's. She returns for reevaluation    UPDATE 04/18/16 CM Ms. Ashley BarreJones -Robinson,60 year old [name] returns for follow-up. She was last seen in Ashley office by Dr. Anne HahnWillis November 2015. She was seen at that time for Lumbar radicular pain,  MRI lumbar spine showed scoliosis degenerative changes at L4-L5 and L5-S1with prior surgery on Ashley left side , no significant spinal stenosis neuro foraminal stenosis was seen. EMG nerve conduction both lower extremities without evidence of overlying lumbosacral radiculopathy.  Pt having headaches now,seen in urgent care given migraine cocktail week ago a prednisone Dosepak.  Just finish prednisone last week.She reports she has a history of migraines years ago . 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 She can have pain over one eye, throbbing sensation nausea and vomiting and is sensitive to light and sound. She is not aware of any specific foods. She also has history of fibromyalgia and is currently receiving acupuncture By Dr. Larna DaughtersKirstens.  She returns for  reevaluation  HISTORY 11/2/15[name] is a 60 year old right-handed black [name] with a history of low back pain. Ashley [name] has had prior lumbosacral spine surgery in Ashley past, but in July 2015, Ashley [name] underwent arthroscopic knee surgery on Ashley right knee, and shortly thereafter began having back pain radiating down Ashley right leg. Ashley [name] indicates that Ashley pain goes down Ashley lateral aspect of Ashley right thigh, and into Ashley gastrocnemius muscle, and then across Ashley top of Ashley foot, so she was some numbness and tingly sensations in this distribution. She believes that Ashley right leg is weaker. She denies any falls, but she has had some stumbles. She does have a chronic issue with control of Ashley bowels and bladder, but this is not a new problem for her. She does have a history of fibromyalgia. She reports some neck discomfort and left shoulder discomfort. She is on gabapentin for her fibromyalgia pain. She was seen by Dr. Newell CoralNudelman, and MRI evaluation of Ashley lumbosacral spine was done, and apparently did not show evidence of nerve root impingement on Ashley right. This study is not available for my review. She is sent to this office for an evaluation. She is currently in physical therapy for her knee and for her back, and this has offered some benefit.  REVIEW OF SYSTEMS: Full 14 system review of systems performed and notable only for those listed, all others are neg:  Constitutional: neg  Cardiovascular: neg Ear/Nose/Throat: neg  Skin: neg Eyes: Blurred vision Respiratory: neg Gastroitestinal: neg  Hematology/Lymphatic: neg  Endocrine: Intolerance to cold Musculoskeletal: Back pain neck pain Allergy/Immunology: Environmental  allergies Neurological: neg Psychiatric: neg Sleep : neg   ALLERGIES: Allergies  Allergen Reactions  . Latex Itching, Swelling and Other (See Comments)    redness  . Sulfa Antibiotics Diarrhea and Nausea And Vomiting  . Aspirin Other (See Comments)     Can cause her to bleed out due to G6 PD  . Oxycodone Itching    "all over"  . Tramadol Other (See Comments)    Makes her feel "spaced out on street drugs"    HOME MEDICATIONS: Outpatient Medications Prior to Visit  Medication Sig Dispense Refill  . acetaminophen-codeine (TYLENOL #3) 300-30 MG tablet Take by mouth 2 (two) times daily as needed for moderate pain.    Marland Kitchen. albuterol (PROVENTIL HFA;VENTOLIN HFA) 108 (90 BASE) MCG/ACT inhaler Inhale into Ashley lungs every 6 (six) hours as needed for wheezing or shortness of breath.    Marland Kitchen. albuterol (PROVENTIL) (2.5 MG/3ML) 0.083% nebulizer solution Take 3 mLs (2.5 mg total) by nebulization every 4 (four) hours as needed for wheezing or shortness of breath. 75 mL 5  . azelastine (OPTIVAR) 0.05 % ophthalmic solution Place 1 drop into both eyes 2 (two) times daily.    . baclofen (LIORESAL) 20 MG tablet Take 1 tablet (20 mg total) by mouth 3 (three) times daily. 15 each 0  . beclomethasone (QVAR) 80 MCG/ACT inhaler Inhale 2 puffs into Ashley lungs 2 (two) times daily. ([name] taking differently: Inhale 2 puffs into Ashley lungs daily. ) 3 Inhaler 3  . desloratadine (CLARINEX) 5 MG tablet Take 5 mg by mouth daily.    . DULoxetine (CYMBALTA) 60 MG capsule Take 60 mg by mouth at bedtime.    . fluticasone (FLONASE) 50 MCG/ACT nasal spray Place 2 sprays into both nostrils 2 (two) times daily. 48 g 3  . gabapentin (NEURONTIN) 800 MG tablet Take 800 mg by mouth 3 (three) times daily.    . hydrochlorothiazide (HYDRODIURIL) 25 MG tablet Take 25 mg by mouth daily.     . montelukast (SINGULAIR) 10 MG tablet Take 10 mg by mouth at bedtime.    . naproxen (NAPROSYN) 500 MG tablet Take 1 tablet (500 mg total) by mouth 2 (two) times daily. 60 tablet 0  . nortriptyline (PAMELOR) 25 MG capsule Take 2 capsules (50 mg total) by mouth at bedtime. 60 capsule 3  . ondansetron (ZOFRAN) 4 MG tablet Take 1 tablet (4 mg total) by mouth every 8 (eight) hours as needed for nausea or vomiting.  20 tablet 1  . pantoprazole (PROTONIX) 40 MG tablet Take 40 mg by mouth daily.    . cyclobenzaprine (FLEXERIL) 10 MG tablet Take 10 mg by mouth at bedtime.     No facility-administered medications prior to visit.     PAST MEDICAL HISTORY: Past Medical History:  Diagnosis Date  . Arthritis   . Fibromyalgia 09/04/2014  . Headache   . Hypertension   . Lumbar radicular pain 09/04/2014    PAST SURGICAL HISTORY: Past Surgical History:  Procedure Laterality Date  . ABDOMINAL HYSTERECTOMY    . BREAST SURGERY    . BUNIONECTOMY    . ESOPHAGEAL MANOMETRY [name] 11/21/2013   Procedure: ESOPHAGEAL MANOMETRY (EM);  Surgeon: Shirley FriarVincent C. Schooler, MD;  Location: [facility];  Service: Endoscopy;  Laterality: [name];  . KNEE ARTHROSCOPY W/ MENISCAL REPAIR    . LUMBAR LAMINECTOMY    . NASAL SINUS SURGERY      FAMILY HISTORY: Family History  Problem Relation Age of Onset  . Aneurysm Father   .  Bipolar disorder Brother   . Diabetes Brother   . Migraines Mother   . Migraines Daughter   . Cancer Other   . Hypertension Other   . Diabetes Other     SOCIAL HISTORY: Social History   Social History  . Marital status: Married    Spouse name: [name]  . Number of children: [name]  . Years of education: [name]   Occupational History  . Not on file.   Social History Main Topics  . Smoking status: Former Smoker    Packs/day: 0.75    Years: 24.00    Types: Cigarettes    Quit date: 11/03/1992  . Smokeless tobacco: Never Used  . Alcohol use 0.6 oz/week    1 Glasses of wine per week     Comment: occasionally  . Drug use: No  . Sexual activity: Not on file   Other Topics Concern  . Not on file   Social History Narrative  . No narrative on file     PHYSICAL EXAM  Vitals:   06/24/16 1540  BP: 131/84  Pulse: 79  Weight: 218 lb 12.8 oz (99.2 kg)  Height: 5\' 9"  (1.753 m)   Body mass index is 32.31 kg/m. General: Ashley [name] is alert and cooperative at Ashley time of Ashley examination.She is  obese Neck: Ashley neck is supple, no carotid bruits are noted. Cardiovascular: Ashley cardiovascular examination reveals a regular rate and rhythm, no obvious murmurs or rubs are noted. Neuromuscular: Ashley [name] has good range of movement of Ashley lumbosacral spine.  Skin: Extremities are without significant edema.  Neurologic Exam  Mental status: Ashley [name] is alert and oriented x 3 at Ashley time of Ashley examination. Ashley [name] has apparent normal recent and remote memory, with an apparently normal attention span and concentration ability.  Cranial nerves: Facial symmetry is present. There is good sensation of Ashley face to pinprick and soft touch bilaterally. Ashley strength of Ashley facial muscles and Ashley muscles to head turning and shoulder shrug are normal bilaterally. Speech is well enunciated, no aphasia or dysarthria is noted. Pupils are equal, round, and reactive to light. Discs are flat bilaterally.Extraocular movements are full. Visual fields are full. Ashley tongue is midline, and Ashley [name] has symmetric elevation of Ashley soft palate. No obvious hearing deficits are noted. Motor: Ashley motor testing reveals 5 over 5 strength of all 4 extremities. Good symmetric motor tone is noted throughout. Ashley [name] is able to walk on heels and Ashley toes bilaterally. Sensory: Sensory testing is intact to pinprick, soft touch, vibration sensation, and position sense on all 4 extremities. No evidence of extinction is noted. Coordination: Cerebellar testing reveals good finger-nose-finger and heel-to-shin bilaterally. Gait and station: Gait is normal. Tandem gait is normal. Romberg is negative. No drift is seen. Reflexes: Deep tendon reflexes are symmetric, but are depressed bilaterally. Toes are downgoing bilaterally.   DIAGNOSTIC DATA (LABS, IMAGING, TESTING) - ASSESSMENT AND PLAN 60 y.o. year old [name]  has a past medical history of Arthritis; Hypertension; Lumbar radicular pain (09/04/2014); Fibromyalgia  (09/04/2014); and Headache. here to follow-up recurrent headaches  PLAN:Continue nortriptyline 25 mg at night Add Topiramate 25 mg by mouth at bedtime for 1 week then increase to 2 capsules at night Zofran 4 mg when necessary nausea Discussed stress relief techniques such as deep breathing muscle relaxation mental relaxation to music.  Discussed exercise  regular meals  and sleep. Sleep deprivation can be a migraine trigger Follow-up in 3months [address], [facility],  [facility], APRN  [facility] [address], [place], [place] [address] [phone]

## 2016-06-24 NOTE — Progress Notes (Signed)
I have read the note, and I agree with the clinical assessment and plan.  Ciro Tashiro KEITH   

## 2016-06-24 NOTE — Patient Instructions (Addendum)
Continue nortriptyline 25 mg at night Add Topiramate 25 mg by mouth at bedtime for 1 week then increase to 2 capsules at night Follow-up in 3 months

## 2016-08-22 ENCOUNTER — Telehealth: Payer: Self-pay | Admitting: Emergency Medicine

## 2016-08-22 MED ORDER — BECLOMETHASONE DIPROPIONATE 80 MCG/ACT IN AERS: 2.0000 | INHALATION_SPRAY | Freq: Two times a day (BID) | RESPIRATORY_TRACT | 3 refills | Status: DC

## 2016-08-22 MED ORDER — BENZONATATE 200 MG PO CAPS: 200.0000 mg | ORAL_CAPSULE | Freq: Three times a day (TID) | ORAL | 0 refills | Status: DC | PRN

## 2016-08-22 NOTE — Telephone Encounter (Signed)
Also check to see if she would like for us to order tessalon perles 200, and / or hycodan 5cc q6h prn cough

## 2016-08-22 NOTE — Telephone Encounter (Signed)
Please have her continue the meds she is taking  Try using a decongestant OTC like sudafed or tylenol cold & flu for symptoms.

## 2016-08-22 NOTE — Telephone Encounter (Signed)
Pt returned phone call..Ashley [name] ° °

## 2016-08-22 NOTE — Telephone Encounter (Signed)
Pt called back and she is aware of the tessalon that has been sent to her local pharmacy.  The qvar has been sent in to her mail order pharmacy too.  Nothing further is needed.

## 2016-08-22 NOTE — Telephone Encounter (Signed)
Called and left a detailed msg with recs and asked to to call back to advise if she wants the tessalon or hycodan

## 2016-08-22 NOTE — Telephone Encounter (Signed)
Spoke with the pt  She is c/o dry eyes, stuffy ears, non prod cough and nasal congestion for the past wk  She states that she is taking clarinex and using saline rinses and flonase with no relief  She is requesting recs  Please advise, thanks! Allergies  Allergen Reactions  . Latex Itching, Swelling and Other (See Comments)    redness  . Sulfa Antibiotics Diarrhea and Nausea And Vomiting  . Aspirin Other (See Comments)    Can cause her to bleed out due to G6 PD  . Oxycodone Itching    "all over"  . Tramadol Other (See Comments)    Makes her feel "spaced out on street drugs"

## 2016-08-22 NOTE — Telephone Encounter (Signed)
Pt called stating she may not be available for callback and it is ok to leave a message at this number. [phone].Charm Rings.Ashley [name]

## 2016-08-22 NOTE — Telephone Encounter (Signed)
Noted by triage Routing back to RB to address pt's symptoms

## 2016-09-12 ENCOUNTER — Ambulatory Visit (HOSPITAL_BASED_OUTPATIENT_CLINIC_OR_DEPARTMENT_OTHER): Payer: 59 | Admitting: Physical Medicine & Rehabilitation

## 2016-09-12 ENCOUNTER — Encounter: Payer: Self-pay | Admitting: Physical Medicine & Rehabilitation

## 2016-09-12 ENCOUNTER — Encounter: Payer: 59 | Attending: Physical Medicine & Rehabilitation

## 2016-09-12 VITALS — BP 132/85 | HR 83 | Resp 14

## 2016-09-12 DIAGNOSIS — M797 Fibromyalgia: Secondary | ICD-10-CM

## 2016-09-12 DIAGNOSIS — Z5181 Encounter for therapeutic drug level monitoring: Secondary | ICD-10-CM | POA: Insufficient documentation

## 2016-09-12 DIAGNOSIS — M545 Low back pain: Secondary | ICD-10-CM | POA: Insufficient documentation

## 2016-09-12 DIAGNOSIS — Z79899 Other long term (current) drug therapy: Secondary | ICD-10-CM | POA: Insufficient documentation

## 2016-09-12 DIAGNOSIS — M533 Sacrococcygeal disorders, not elsewhere classified: Secondary | ICD-10-CM | POA: Insufficient documentation

## 2016-09-12 DIAGNOSIS — M4716 Other spondylosis with myelopathy, lumbar region: Secondary | ICD-10-CM | POA: Diagnosis not present

## 2016-09-12 DIAGNOSIS — G894 Chronic pain syndrome: Secondary | ICD-10-CM | POA: Diagnosis present

## 2016-09-12 NOTE — Progress Notes (Signed)
Subjective:    Patient ID: Ashley [name], female    DOB: 1955-12-19, 60 y.o.   MRN: [id number]  HPI  Sees [facility] for Low back pain and LLE pain, Lumbar MRI neg  Drowsiness during the day Started on Topamax for HA Fibromyalgia pain is doing better, continues with integrative therapies once a week. Takes gabapentin 800 mg 3 times a day, complaints of drowsiness with this medication. On nortriptyline 25 mg daily at bedtime as well. Pain Inventory Average Pain 8 Pain Right Now 5 My pain is intermittent, constant, sharp, burning, stabbing and aching  In the last 24 hours, has pain interfered with the following? General activity 9 Relation with others 7 Enjoyment of life 9 What TIME of day is your pain at its worst? daytime, evening, night Sleep (in general) Fair  Pain is worse with: sitting and standing Pain improves with: rest, heat/ice, therapy/exercise, medication, TENS and injections Relief from Meds: 7  Mobility use a cane how many minutes can you walk? 5-10 ability to climb steps?  yes do you drive?  yes Do you have any goals in this area?  yes  Function what is your job? 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  yes  Neuro/Psych numbness tingling spasms depression  Prior Studies Any changes since last visit?  no  Physicians involved in your care Any changes since last visit?  no   Family History  Problem Relation Age of Onset  . Aneurysm Father   . Bipolar disorder Brother   . Diabetes Brother   . Migraines Mother   . Migraines Daughter   . Cancer Other   . Hypertension Other   . Diabetes Other    Social History   Social History  . Marital status: Married    Spouse name: [name]  . Number of children: [name]  . Years of education: [name]   Social History Main Topics  . Smoking status: Former Smoker    Packs/day: 0.75    Years: 24.00    Types: Cigarettes    Quit date: 11/03/1992   . Smokeless tobacco: Never Used  . Alcohol use 0.6 oz/week    1 Glasses of wine per week     Comment: occasionally  . Drug use: No  . Sexual activity: Not Asked   Other Topics Concern  . None   Social History Narrative  . None   Past Surgical History:  Procedure Laterality Date  . ABDOMINAL HYSTERECTOMY    . BREAST SURGERY    . BUNIONECTOMY    . ESOPHAGEAL MANOMETRY [name] 11/21/2013   Procedure: ESOPHAGEAL MANOMETRY (EM);  Surgeon: Shirley FriarVincent C. Schooler, MD;  Location: [facility];  Service: Endoscopy;  Laterality: [name];  . KNEE ARTHROSCOPY W/ MENISCAL REPAIR    . LUMBAR LAMINECTOMY    . NASAL SINUS SURGERY     Past Medical History:  Diagnosis Date  . Arthritis   . Fibromyalgia 09/04/2014  . Headache   . Hypertension   . Lumbar radicular pain 09/04/2014   BP 132/85   Pulse 83   Resp 14   SpO2 98%   Opioid Risk Score:   Fall Risk Score:  `1  Depression screen PHQ 2/9  Depression screen [facility] 2/9 03/10/2016 06/29/2014  Decreased Interest 3 0  Down, Depressed, Hopeless 2 0  PHQ - 2 Score 5 0  Altered sleeping 3 -  Tired, decreased energy 3 -  Change in appetite  3 -  Feeling bad or failure about yourself  0 -  Trouble concentrating 3 -  Moving slowly or fidgety/restless 3 -  Suicidal thoughts 0 -  PHQ-9 Score 20 -  Difficult doing work/chores Extremely dIfficult -     Review of Systems  Respiratory: Positive for cough and shortness of breath.   Gastrointestinal: Positive for constipation.  Musculoskeletal:       Limb swelling  All other systems reviewed and are negative.      Objective:   Physical Exam  Constitutional: She is oriented to person, place, and time. She appears well-developed and well-nourished.  HENT:  Head: Normocephalic and atraumatic.  Eyes: Conjunctivae and EOM are normal. Pupils are equal, round, and reactive to light.  Neck: Normal range of motion.  Musculoskeletal: Normal range of motion.  Neurological: She is alert and oriented to  person, place, and time. She has normal strength.  Reflex Scores:      Patellar reflexes are 1+ on the right side and 2+ on the left side.      Achilles reflexes are 1+ on the right side and 1+ on the left side. Psychiatric: She has a normal mood and affect.  Nursing note and vitals reviewed.   Decreased Right S1 sensation Gait is without evidence of toe drag or knee instability. There is tenderness to palpation along the thoracic or lumbar paraspinals.  1 plus right pedal edema, 2 plus left pedal edema. Nontender, no erythema  Lumbar spine, limited range of motion, flexion, extension, lateral bending and rotation.     Assessment & Plan:  1.Fibromyalgia syndrome. Doing well with physical therapy. She has lost about 10 pounds and I have encouraged her to continue with her efforts. She is trying to become more physically active as well. We discussed her drowsiness. While I am not prescribing her gabapentin, it is one of her medications that has been use to manage fibromyalgia symptoms. She has been started on Topamax as well as nortriptyline. We discussed that she may only need a lower dose of gabapentin with the other medications on board. Have advised. Reduce gabapentin, 400 mg 3 times a day. This should be done over the weekend to see the effect on pain control as well as drowsiness.  Return to clinic 6 months

## 2016-09-12 NOTE — Patient Instructions (Signed)
May try cutting the gabapentin tablets in half and take 400 mg 3 times a day over a weekend to see if this controls her pain and if you are less drowsy.

## 2016-09-23 ENCOUNTER — Telehealth: Payer: Self-pay | Admitting: Emergency Medicine

## 2016-09-23 MED ORDER — BECLOMETHASONE DIPROPIONATE 40 MCG/ACT IN AERS: 4.0000 | INHALATION_SPRAY | Freq: Two times a day (BID) | RESPIRATORY_TRACT | 0 refills | Status: DC

## 2016-09-23 NOTE — Telephone Encounter (Signed)
Called and spoke with pt and she is aware that per SN--  We can give her samples of the qvar 40 that we have here since she is having a hard time getting the qvar 80.   She will have to take 4 puffs bid and she is aware of this.  She is aware to rinse her mouth out very well after each use.  Will leave these up front and she will pick this up tomorrow.

## 2016-10-01 ENCOUNTER — Encounter: Payer: Self-pay | Admitting: Nurse Practitioner

## 2016-10-01 ENCOUNTER — Ambulatory Visit (INDEPENDENT_AMBULATORY_CARE_PROVIDER_SITE_OTHER): Payer: 59 | Admitting: Nurse Practitioner

## 2016-10-01 VITALS — BP 123/75 | HR 80 | Resp 16 | Ht 69.0 in | Wt 218.0 lb

## 2016-10-01 DIAGNOSIS — M5416 Radiculopathy, lumbar region: Secondary | ICD-10-CM

## 2016-10-01 DIAGNOSIS — M5442 Lumbago with sciatica, left side: Secondary | ICD-10-CM | POA: Diagnosis not present

## 2016-10-01 DIAGNOSIS — Z8669 Personal history of other diseases of the nervous system and sense organs: Secondary | ICD-10-CM | POA: Diagnosis not present

## 2016-10-01 DIAGNOSIS — R51 Headache: Secondary | ICD-10-CM

## 2016-10-01 DIAGNOSIS — G8929 Other chronic pain: Secondary | ICD-10-CM | POA: Diagnosis not present

## 2016-10-01 DIAGNOSIS — R519 Headache, unspecified: Secondary | ICD-10-CM

## 2016-10-01 MED ORDER — NORTRIPTYLINE HCL 25 MG PO CAPS: ORAL_CAPSULE | ORAL | 6 refills | Status: DC

## 2016-10-01 MED ORDER — TOPIRAMATE 25 MG PO TABS: ORAL_TABLET | ORAL | 6 refills | Status: DC

## 2016-10-01 NOTE — Progress Notes (Signed)
[facility]  PATIENT: Ashley [name] DOB: [birth year]   REASON FOR VISIT: Follow-up for lumbar radicular pain, fibromyalgia and headaches HISTORY FROM: Patient    HISTORY OF PRESENT ILLNESS: UPDATE 11/29/2017[name], 60 year old female returns for follow-up. She has history of migraine headaches and is currently on nortriptyline and Topamax with good benefit. Her triggers are weather changes and stress. She has a history of lumbar radicular pain.MRI lumbar spine in 2015 showed scoliosis degenerative changes at L4-L5 and L5-S1with prior surgery on the left side , no significant spinal stenosis neuro foraminal stenosis was seen. EMG nerve conduction both lower extremities without evidence of overlying lumbosacral radiculopathy. Her sciatica has worsened since last seen when she complains of pain in the lower back running down to the knee area on the right  and into the foot. There have been no change in her bowel or bladder control .She is due to see a specialist later this morning for a second opinion of her back pain. She continues to see Dr. Trecia Rogers.  She returns for reevaluation she needs refills on her medications. She also attends pain management    UPDATE 08/22/2017[name], 60 year old female returns for follow-up. She has a history of migraine headaches and is currently having one to 2 headaches per week mostly due to stress, lack of sleep and weather changes. 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 Her nortriptyline was increased at her last visit but she was too hung over in the next day so she went back to taking 25 mg at bedtime. She also has history of fibromyalgia and is on baclofen Cymbalta and Neurontin. She continues to receive acupuncture by Dr. Trecia Rogers. She returns for reevaluation    UPDATE 04/18/16 [name],60 year old female returns for follow-up. She was last seen in the office by Dr.  Anne Hahn November 2015. She was seen at that time for Lumbar radicular pain,  MRI lumbar spine showed scoliosis degenerative changes at L4-L5 and L5-S1with prior surgery on the left side , no significant spinal stenosis neuro foraminal stenosis was seen. EMG nerve conduction both lower extremities without evidence of overlying lumbosacral radiculopathy.  Pt having headaches now,seen in urgent care given migraine cocktail week ago a prednisone Dosepak.  Just finish prednisone last week.She reports she has a history of migraines years ago . 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 She can have pain over one eye, throbbing sensation nausea and vomiting and is sensitive to light and sound. She is not aware of any specific foods. She also has history of fibromyalgia and is currently receiving acupuncture By Dr. Larna Daughters.  She returns for reevaluation  HISTORY 11/2/15[name] is a 60 year old right-handed black female with a history of low back pain. The patient has had prior lumbosacral spine surgery in the past, but in July 2015, the patient underwent arthroscopic knee surgery on the right knee, and shortly thereafter began having back pain radiating down the right leg. The patient indicates that the pain goes down the lateral aspect of the right thigh, and into the gastrocnemius muscle, and then across the top of the foot, so she was some numbness and tingly sensations in this distribution. She believes that the right leg is weaker. She denies any falls, but she has had some stumbles.  She does have a chronic issue with control of the bowels and bladder, but this is not a new problem for her. She does have a history of fibromyalgia. She reports some neck discomfort and left shoulder discomfort. She is on gabapentin for her fibromyalgia pain. She was seen by Dr.  Newell CoralNudelman, and MRI evaluation of the lumbosacral spine was done, and apparently did not show evidence of nerve root impingement on the right. This study is not available for my review. She is sent to this office for an evaluation. She is currently in physical therapy for her knee and for her back, and this has offered some benefit.  REVIEW OF SYSTEMS: Full 14 system review of systems performed and notable only for those listed, all others are neg:  Constitutional: neg  Cardiovascular: neg Ear/Nose/Throat: neg  Skin: neg Eyes: Blurred vision, itchy eyes Respiratory: neg Gastroitestinal: neg  Hematology/Lymphatic: neg  Endocrine: Intolerance to cold Musculoskeletal: Back pain neck pain, walking difficulty Allergy/Immunology: Environmental allergies Neurological: Rare headache Psychiatric: neg Sleep : neg   ALLERGIES: Allergies  Allergen Reactions  . Latex Itching, Swelling and Other (See Comments)    redness  . Sulfa Antibiotics Diarrhea and Nausea And Vomiting  . Aspirin Other (See Comments)    Can cause her to bleed out due to G6 PD  . Oxycodone Itching    "all over"  . Tramadol Other (See Comments)    Makes her feel "spaced out on street drugs"    HOME MEDICATIONS: Outpatient Medications Prior to Visit  Medication Sig Dispense Refill  . acetaminophen-codeine (TYLENOL #3) 300-30 MG tablet Take by mouth 2 (two) times daily as needed for moderate pain.    Marland Kitchen. albuterol (PROVENTIL HFA;VENTOLIN HFA) 108 (90 BASE) MCG/ACT inhaler Inhale into the lungs every 6 (six) hours as needed for wheezing or shortness of breath.    Marland Kitchen. albuterol (PROVENTIL) (2.5 MG/3ML) 0.083% nebulizer solution Take 3 mLs (2.5 mg total) by nebulization every 4 (four) hours as needed for wheezing or shortness of breath. 75 mL 5  . azelastine (OPTIVAR) 0.05 % ophthalmic solution Place 1 drop into both eyes 2 (two) times daily.    . baclofen (LIORESAL) 20 MG tablet Take 1 tablet (20 mg total) by mouth 3 (three) times  daily. 15 each 0  . beclomethasone (QVAR) 40 MCG/ACT inhaler Inhale 4 puffs into the lungs 2 (two) times daily. 2 Inhaler 0  . benzonatate (TESSALON) 200 MG capsule Take 1 capsule (200 mg total) by mouth 3 (three) times daily as needed for cough. 40 capsule 0  . desloratadine (CLARINEX) 5 MG tablet Take 5 mg by mouth daily.    . DULoxetine (CYMBALTA) 60 MG capsule Take 60 mg by mouth at bedtime.    . fluticasone (FLONASE) 50 MCG/ACT nasal spray Place 2 sprays into both nostrils 2 (two) times daily. 48 g 3  . gabapentin (NEURONTIN) 800 MG tablet Take 800 mg by mouth 3 (three) times daily.    . hydrochlorothiazide (HYDRODIURIL) 25 MG tablet Take 25 mg by mouth daily.     . montelukast (SINGULAIR) 10 MG tablet Take 10 mg by mouth at bedtime.    . naproxen (NAPROSYN) 500 MG tablet Take 1 tablet (500 mg total) by mouth 2 (two) times daily. 60 tablet 0  . nortriptyline (PAMELOR) 25 MG capsule Take 2 capsules (50 mg total) by mouth at bedtime. (Patient taking differently: Take 25 mg by mouth at bedtime. ) 60 capsule 3  . ondansetron (ZOFRAN) 4 MG  tablet Take 1 tablet (4 mg total) by mouth every 8 (eight) hours as needed for nausea or vomiting. 20 tablet 1  . pantoprazole (PROTONIX) 40 MG tablet Take 40 mg by mouth daily.    Marland Kitchen. topiramate (TOPAMAX) 25 MG tablet Take 1 tablet (25 mg total) by mouth 2 (two) times daily. 1 po at bedtime for 1 week then 2 at bedtime 60 tablet 4  . beclomethasone (QVAR) 80 MCG/ACT inhaler Inhale 2 puffs into the lungs 2 (two) times daily. (Patient not taking: Reported on 10/01/2016) 3 Inhaler 3   No facility-administered medications prior to visit.     PAST MEDICAL HISTORY: Past Medical History:  Diagnosis Date  . Arthritis   . Fibromyalgia 09/04/2014  . Headache   . Hypertension   . Lumbar radicular pain 09/04/2014    PAST SURGICAL HISTORY: Past Surgical History:  Procedure Laterality Date  . ABDOMINAL HYSTERECTOMY    . BREAST SURGERY    . BUNIONECTOMY    .  ESOPHAGEAL MANOMETRY [name] 11/21/2013   Procedure: ESOPHAGEAL MANOMETRY (EM);  Surgeon: Shirley FriarVincent C. Schooler, MD;  Location: [facility];  Service: Endoscopy;  Laterality: [name];  . KNEE ARTHROSCOPY W/ MENISCAL REPAIR    . LUMBAR LAMINECTOMY    . NASAL SINUS SURGERY      FAMILY HISTORY: Family History  Problem Relation Age of Onset  . Aneurysm Father   . Bipolar disorder Brother   . Diabetes Brother   . Migraines Mother   . Migraines Daughter   . Cancer Other   . Hypertension Other   . Diabetes Other     SOCIAL HISTORY: Social History   Social History  . Marital status: Married    Spouse name: [name]  . Number of children: [name]  . Years of education: [name]   Occupational History  . Not on file.   Social History Main Topics  . Smoking status: Former Smoker    Packs/day: 0.75    Years: 24.00    Types: Cigarettes    Quit date: 11/03/1992  . Smokeless tobacco: Never Used  . Alcohol use 0.6 oz/week    1 Glasses of wine per week     Comment: occasionally  . Drug use: No  . Sexual activity: Not on file   Other Topics Concern  . Not on file   Social History Narrative  . No narrative on file     PHYSICAL EXAM  Vitals:   10/01/16 0747  BP: 123/75  Pulse: 80  Resp: 16  Weight: 218 lb (98.9 kg)  Height: 5\' 9"  (1.753 m)   Body mass index is 32.19 kg/m. General: The patient is alert and cooperative at the time of the examination.She is obese Neck: The neck is supple, no carotid bruits are noted. Cardiovascular:  regular rate and rhythm, no obvious murmurs or rubs are noted. Neuromuscular: The patient has good range of movement of the lumbosacral spine.  Skin: Extremities are without significant edema.  Neurologic Exam  Mental status: The patient is alert and oriented x 3 at the time of the examination. The patient has apparent normal recent and remote memory, with an apparently normal attention span and concentration ability.  Cranial nerves: Facial symmetry is  present. There is good sensation of the face to pinprick and soft touch bilaterally. The strength of the facial muscles and the muscles to head turning and shoulder shrug are normal bilaterally. Speech is well enunciated, no aphasia or dysarthria is noted. Pupils are equal, round, and  reactive to light. Discs are flat bilaterally.Extraocular movements are full. Visual fields are full. The tongue is midline, and the patient has symmetric elevation of the soft palate. No obvious hearing deficits are noted. Motor: The motor testing reveals 5 over 5 strength of all 4 extremities. Good symmetric motor tone is noted throughout.  Sensory: Sensory testing is intact to pinprick, soft touch, vibration sensation, in all 4 extremities. No evidence of extinction is noted. Coordination: Cerebellar testing reveals good finger-nose-finger and heel-to-shin bilaterally. Gait and station: Gait is wide based. The patient is able to walk on heels and the toes bilaterally. Tandem gait is unsteady. Romberg is negative. No drift is seen. Reflexes: Deep tendon reflexes are symmetric, but are depressed bilaterally. Toes are downgoing bilaterally.   DIAGNOSTIC DATA (LABS, IMAGING, TESTING) - ASSESSMENT AND PLAN 60 y.o. year old female  has a past medical history of Arthritis; Hypertension; Lumbar radicular pain (09/04/2014); Fibromyalgia (09/04/2014); and Headache. here to follow-up recurrent headaches  PLAN:Continue nortriptyline 25 mg at night will refill  Topiramate 25 mg  2 capsules at night, will refill Zofran 4 mg when necessary nausea Follow up for nerve blocks has second opinion appt today Follow-up in 6months Nilda Riggs, [facility], [facility], APRN  [facility] [address] [place], [place] [address] [phone]

## 2016-10-01 NOTE — Progress Notes (Signed)
I have read the note, and I agree with the clinical assessment and plan.  WILLIS,CHARLES KEITH   

## 2016-10-01 NOTE — Patient Instructions (Addendum)
Continue nortriptyline 25 mg at night will refill  Topiramate 25 mg  2 capsules at night, will refill Zofran 4 mg when necessary nausea Follow up for nerve blocks Follow-up in 6months

## 2016-10-20 ENCOUNTER — Ambulatory Visit (INDEPENDENT_AMBULATORY_CARE_PROVIDER_SITE_OTHER): Payer: 59 | Admitting: Emergency Medicine

## 2016-10-20 ENCOUNTER — Encounter: Payer: Self-pay | Admitting: Emergency Medicine

## 2016-10-20 DIAGNOSIS — J45909 Unspecified asthma, uncomplicated: Secondary | ICD-10-CM

## 2016-10-20 DIAGNOSIS — J301 Allergic rhinitis due to pollen: Secondary | ICD-10-CM | POA: Diagnosis not present

## 2016-10-20 DIAGNOSIS — K219 Gastro-esophageal reflux disease without esophagitis: Secondary | ICD-10-CM | POA: Diagnosis not present

## 2016-10-20 MED ORDER — BECLOMETHASONE DIPROPIONATE 80 MCG/ACT IN AERS: 2.0000 | INHALATION_SPRAY | Freq: Two times a day (BID) | RESPIRATORY_TRACT | 0 refills | Status: DC

## 2016-10-20 NOTE — Assessment & Plan Note (Signed)
Clarinex, nasal saline, nasal steroid as ordered.

## 2016-10-20 NOTE — Addendum Note (Signed)
Addended by: Cydney OkAUGUSTIN, Jaton Eilers N on: 10/20/2016 03:52 PM   Modules accepted: Orders

## 2016-10-20 NOTE — Addendum Note (Signed)
Addended by: Cydney OkAUGUSTIN, Shenaya Lebo N on: 10/20/2016 04:21 PM   Modules accepted: Orders

## 2016-10-20 NOTE — Assessment & Plan Note (Signed)
Fairly well controlled. Contributor are GERD and rhinitis. I have some hope that restarting immunotherapy would allow us to come off of QVAR, possibly even simplify her allergy daily meds. She is about to change insurances so we may have to change QVAR to an alternative after the new year if we are planning to stay on maintenance meds.

## 2016-10-20 NOTE — Progress Notes (Signed)
h Subjective:    Patient ID: Ashley [name], female    DOB: 09-07-1956, 60 y.o.   MRN: [id number]  Asthma  She complains of cough and shortness of breath. There is no wheezing. Associated symptoms include headaches. Pertinent negatives include no ear pain, fever, postnasal drip, rhinorrhea, sneezing, sore throat or trouble swallowing. Her past medical history is significant for asthma.   60 yo woman, former smoker (20 pk-yrs), hx HTN, Allergies, GERD. Has been given dx of asthma associated with her GERD. She describes cough that bothers her in the am and brings up mucous, shortness of breath with exertion. She used to hear wheeze, but this has improved since QVAR and omeprazole. She is on [place], desloratadine, fluticasone nasal. Has been seen by Dr Willa RoughHicks in Allergy and had spirometry. Was started on QVAR and her breathing has improved. She does not wheeze now. She has some triggers > leaves, fresh cut grass, smoke. She uses SABA rarely - both times after she was singing. She has gained some wt > 25-30 lbs over 2 yrs.   ROV 02/06/15 -- follow up for her asthma. She was seen and treated in 2/'16 for an apparent flare with cough, following a URI. She had run out of QVAR at the time. She has benefited significantly from restarting the QVAR.  Rarely uses her SABA. She is on fluticasone qd, clarinex, dexilant. She had PFT today   ROV 09/19/15 -- follow-up visit for history of former tobacco use and asthma, impacted by her GERD and allergic rhinitis. She has gotten rid of her carpet, has helped her allergies some. She reliably coughs every morning after her tea. She is on pantoprazole, clarinex, fluticasone, QVAR qd. has a dry cough during the night. Uses albuterol prn, very rarely. No asthma flares. She wonders if she needs tessalon for qhs.  ROV 03/18/16 -- follow-up visit for patient with a history of asthma and former tobacco use. She also has GERD and allergic rhinitis. She feels that her breathing  has been pretty good. 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 Then she is better after she clears some mucous, good for the rest of the day. She uses albuterol many days, usually in the am. She is having nasal congestion, also has GERD. Seem to be under control with protonix, flonase, NSW, clarinex. She is considering restarting allergy immunotherapy.   ROV 10/20/16 -- patient has a history of former tobacco, asthma,  GERD and allergic rhinitis. PFT from 02/06/15 showed grossly normal spirometry but with curve of the flow volume loop suggestive of mild obstruction. She has been managed with Clarinex, nasal saline washes, fluticasone nasal spray and Protonix. She is on QVAR 40mcg 4 puffs bid. She had run out of QVAR for a few months - restarted as above. Feels some dry throat. Minimal wheeze, cough, albuterol use. She would like to simplify her daily regimen if possible.    Review of Systems  Constitutional: Negative for fever and unexpected weight change.  HENT: Negative for congestion, dental problem, ear pain, nosebleeds, postnasal drip, rhinorrhea, sinus pressure, sneezing, sore throat and trouble swallowing.   Eyes: Negative for redness and itching.  Respiratory: Positive for cough and shortness of breath. Negative for choking, chest tightness and wheezing.   Cardiovascular: Negative for palpitations and leg swelling.  Gastrointestinal: Negative for nausea and vomiting.  Genitourinary: Negative for dysuria.  Musculoskeletal: Negative for joint swelling.  Skin: Negative for rash.  Neurological: Positive for headaches.  Hematological: Does not bruise/bleed easily.  Psychiatric/Behavioral: Negative for dysphoric mood. The patient is not nervous/anxious.     Past Medical History:  Diagnosis Date  . Arthritis   . Fibromyalgia 09/04/2014  . Headache   . Hypertension   . Lumbar radicular pain 09/04/2014     Family History  Problem Relation Age of Onset  .  Aneurysm Father   . Bipolar disorder Brother   . Diabetes Brother   . Migraines Mother   . Migraines Daughter   . Cancer Other   . Hypertension Other   . Diabetes Other      Social History   Social History  . Marital status: Married    Spouse name: [name]  . Number of children: [name]  . Years of education: [name]   Occupational History  . Not on file.   Social History Main Topics  . Smoking status: Former Smoker    Packs/day: 0.75    Years: 24.00    Types: Cigarettes    Quit date: 11/03/1992  . Smokeless tobacco: Never Used  . Alcohol use 0.6 oz/week    1 Glasses of wine per week     Comment: occasionally  . Drug use: No  . Sexual activity: Not on file   Other Topics Concern  . Not on file   Social History Narrative  . No narrative on file     Allergies  Allergen Reactions  . Latex Itching, Swelling and Other (See Comments)    redness  . Sulfa Antibiotics Diarrhea and Nausea And Vomiting  . Aspirin Other (See Comments)    Can cause her to bleed out due to G6 PD  . Oxycodone Itching    "all over"  . Tramadol Other (See Comments)    Makes her feel "spaced out on street drugs"     Outpatient Medications Prior to Visit  Medication Sig Dispense Refill  . acetaminophen-codeine (TYLENOL #3) 300-30 MG tablet Take by mouth 2 (two) times daily as needed for moderate pain.    Marland Kitchen albuterol (PROVENTIL HFA;VENTOLIN HFA) 108 (90 BASE) MCG/ACT inhaler Inhale into the lungs every 6 (six) hours as needed for wheezing or shortness of breath.    Marland Kitchen albuterol (PROVENTIL) (2.5 MG/3ML) 0.083% nebulizer solution Take 3 mLs (2.5 mg total) by nebulization every 4 (four) hours as needed for wheezing or shortness of breath. 75 mL 5  . azelastine (OPTIVAR) 0.05 % ophthalmic solution Place 1 drop into both eyes 2 (two) times daily.    . baclofen (LIORESAL) 20 MG tablet Take 1 tablet (20 mg total) by mouth 3 (three) times daily. 15 each 0  . beclomethasone (QVAR) 40 MCG/ACT inhaler Inhale 4 puffs  into the lungs 2 (two) times daily. 2 Inhaler 0  . benzonatate (TESSALON) 200 MG capsule Take 1 capsule (200 mg total) by mouth 3 (three) times daily as needed for cough. 40 capsule 0  . desloratadine (CLARINEX) 5 MG tablet Take 5 mg by mouth daily.    . DULoxetine (CYMBALTA) 60 MG capsule Take 60 mg by mouth at bedtime.    . fluticasone (FLONASE) 50 MCG/ACT nasal spray Place 2 sprays into both nostrils 2 (two) times daily. 48 g 3  . gabapentin (NEURONTIN) 800 MG tablet Take 800 mg by mouth 3 (three) times daily.    . hydrochlorothiazide (HYDRODIURIL) 25 MG tablet Take 25 mg by mouth daily.     . montelukast (SINGULAIR) 10 MG tablet Take 10 mg by mouth at bedtime.    Marland Kitchen  naproxen (NAPROSYN) 500 MG tablet Take 1 tablet (500 mg total) by mouth 2 (two) times daily. 60 tablet 0  . nortriptyline (PAMELOR) 25 MG capsule Take 25mg  at hs too drowsy on 50mg  30 capsule 6  . ondansetron (ZOFRAN) 4 MG tablet Take 1 tablet (4 mg total) by mouth every 8 (eight) hours as needed for nausea or vomiting. 20 tablet 1  . pantoprazole (PROTONIX) 40 MG tablet Take 40 mg by mouth daily.    Marland Kitchen. topiramate (TOPAMAX) 25 MG tablet 2 at bedtime 60 tablet 6  . beclomethasone (QVAR) 80 MCG/ACT inhaler Inhale 2 puffs into the lungs 2 (two) times daily. (Patient not taking: Reported on 10/20/2016) 3 Inhaler 3   No facility-administered medications prior to visit.         Objective:   Physical Exam Vitals:   10/20/16 1525  BP: 128/76  Pulse: 90  SpO2: 100%  Weight: 220 lb (99.8 kg)  Height: 5\' 9"  (1.753 m)   Gen: Pleasant, well-nourished, in no distress,  normal affect  ENT: No lesions,  mouth clear,  oropharynx clear, no postnasal drip  Neck: No JVD, no TMG, no carotid bruits  Lungs: No use of accessory muscles,  clear without rales or rhonchi  Cardiovascular: RRR, heart sounds normal, no murmur or gallops, no peripheral edema  Musculoskeletal: No deformities, no cyanosis or clubbing  Neuro: alert, non  focal  Skin: Warm, no lesions or rashes      Assessment & Plan:  GERD (gastroesophageal reflux disease) Continue protonix as ordered  Allergic rhinitis Clarinex, nasal saline, nasal steroid as ordered.   Intrinsic asthma Fairly well controlled. Contributor are GERD and rhinitis. I have some hope that restarting immunotherapy would allow us to come off of QVAR, possibly even simplify her allergy daily meds. She is about to change insurances so we may have to change QVAR to an alternative after the new year if we are planning to stay on maintenance meds.   Levy Pupaobert Bunny Lowdermilk, MD, PhD 10/20/2016, 3:44 PM [facility] [phone] or if no answer [phone]

## 2016-10-20 NOTE — Assessment & Plan Note (Signed)
Continue protonix as ordered. °

## 2016-10-20 NOTE — Patient Instructions (Addendum)
Please continue your fluticasone nasal spray, clarinex, nasal saline washes.  Please continue your Protonix  We will continue QVAR twice a day for now (either 2 puffs of the 80mcg, or 4 puffs of the 40mcg) We discussed re-evaluation for allergy shots today. We will refer you to discuss this with an Allergist. If you restart immunotherapy then we may be able to stop some of your current allergy or asthma medications.  Follow with Dr Delton CoombesByrum in 6 months or sooner if you have any problems

## 2016-11-05 ENCOUNTER — Ambulatory Visit (INDEPENDENT_AMBULATORY_CARE_PROVIDER_SITE_OTHER): Payer: 59 | Admitting: Allergy & Immunology

## 2016-11-05 ENCOUNTER — Encounter: Payer: Self-pay | Admitting: Allergy & Immunology

## 2016-11-05 VITALS — BP 118/70 | HR 98 | Temp 97.5°F | Resp 18 | Ht 69.0 in | Wt 220.0 lb

## 2016-11-05 DIAGNOSIS — J300 Vasomotor rhinitis: Secondary | ICD-10-CM

## 2016-11-05 DIAGNOSIS — J4540 Moderate persistent asthma, uncomplicated: Secondary | ICD-10-CM | POA: Diagnosis not present

## 2016-11-05 DIAGNOSIS — K219 Gastro-esophageal reflux disease without esophagitis: Secondary | ICD-10-CM

## 2016-11-05 MED ORDER — MONTELUKAST SODIUM 10 MG PO TABS: 10.0000 mg | ORAL_TABLET | Freq: Every day | ORAL | 5 refills | Status: DC

## 2016-11-05 MED ORDER — AZELASTINE-FLUTICASONE 137-50 MCG/ACT NA SUSP: 2.0000 | Freq: Two times a day (BID) | NASAL | 5 refills | Status: DC

## 2016-11-05 NOTE — Patient Instructions (Addendum)
1. Moderate persistent asthma, uncomplicated - Lung function was normal today. - We will not make any medication changes today. - Daily controller medication(s): Qvar 80mcg two puffs twice daily with spacer + Singulair 10mg  daily - Rescue medications: ProAir 4 puffs every 4-6 hours as needed or DuoNeb nebulizer one vial every 4-6 hours as needed - Changes during respiratory infections or worsening symptoms: increase Qvar 80mcg to 4 puffs twice daily for TWO WEEKS. - Asthma control goals:  * Full participation in all desired activities (may need albuterol before activity) * Albuterol use two time or less a week on average (not counting use with activity) * Cough interfering with sleep two time or less a month * Oral steroids no more than once a year * No hospitalizations  2. Chronic rhinitis - Since your last skin testing was negative, we will get some blood work to see if there is any evidence of allergies. - If the blood testing is negative, we can get you back into the clinic for repeat skin testing. - In the meantime, we will start Dymista two sprays per nostril once daily (can increase to two sprays per nostril twice daily if needed).  - Continue with Clarinex 5mg  daily   3. Gastroesophageal reflux disease - Continue with your reflux medications.   4. Return in about 4 weeks (around 12/03/2016).  Please inform us of any Emergency Department visits, hospitalizations, or changes in symptoms. Call us before going to the ED for breathing or allergy symptoms since we might be able to fit you in for a sick visit. Feel free to contact us anytime with any questions, problems, or concerns.  It was a pleasure to meet you today! Best wishes in the [place] Year!   Websites that have reliable patient information: 1. American Academy of Asthma, Allergy, and Immunology: www.aaaai.org 2. Food Allergy Research and Education (FARE): foodallergy.org 3. Mothers of Asthmatics:  http://www.asthmacommunitynetwork.org 4. American College of Allergy, Asthma, and Immunology: www.acaai.org

## 2016-11-05 NOTE — Progress Notes (Signed)
FOLLOW UP  Date of Service/Encounter:  11/05/16   Assessment:   Moderate persistent asthma, uncomplicated  Chronic vasomotor rhinitis  Gastroesophageal reflux disease  Chronic rhinosinusitis   Asthma Reportables:  Severity: moderate persistent  Risk: high due to complex medical history Control: not well controlled  Seasonal Influenza Vaccine: yes   The CDC recommends patients with persistent asthma between the ages of 19-64 years receive PPSV-23 vaccination, if this patient qualifies, has he/she received 1 dose of PPSV-23 yet? Yes    Plan/Recommendations:   1. Moderate persistent asthma, uncomplicated - Lung function was normal today. - We will not make any medication changes today, especially since she is followed by Dr. Delton CoombesByrum.  - Daily controller medication(s): Qvar 80mcg two puffs twice daily with spacer + Singulair 10mg  daily - Rescue medications: ProAir 4 puffs every 4-6 hours as needed or DuoNeb nebulizer one vial every 4-6 hours as needed - Changes during respiratory infections or worsening symptoms: increase Qvar 80mcg to 4 puffs twice daily for TWO WEEKS. - Asthma control goals:  * Full participation in all desired activities (may need albuterol before activity) * Albuterol use two time or less a week on average (not counting use with activity) * Cough interfering with sleep two time or less a month * Oral steroids no more than once a year * No hospitalizations  2. Chronic rhinitis - Since Ashley [name]'s last skin testing was negative, we will get some blood work to see if there is any evidence of allergies. - If the blood testing is negative, we can get the patient back into the clinic for repeat skin testing. - In the meantime, we will start Dymista two sprays per nostril once daily (can increase to two sprays per nostril twice daily if needed).  - Continue with Clarinex 5mg  daily  - Clarinex does cause Ashley [name] to have significant xerostomia, however  she does not want to trial another medication at this time because she otherwise has copious drainage.    3. Gastroesophageal reflux disease - Continue with Protonix.   4. Return in about 4 weeks (around 12/03/2016).   Subjective:   Ashley [name] is a 61 y.o. female presenting today for follow up of  Chief Complaint  Patient presents with  . Follow-up    wants to discuss allergy injection- to get off some medications  . Nasal Congestion    all the time, post nasal drip, pt feels that it is drainging to leftside creates a pocket    Ashley [name] has a history of the following: Patient Active Problem List   Diagnosis Date Noted  . Spondylosis of lumbar region without myelopathy or radiculopathy 05/16/2016  . Generalized headaches 04/21/2016  . History of migraine 04/21/2016  . Chronic low back pain with left-sided sciatica 03/24/2016  . Intrinsic asthma 09/06/2014  . GERD (gastroesophageal reflux disease) 09/06/2014  . Allergic rhinitis 09/06/2014  . Lumbar radicular pain 09/04/2014  . Fibromyalgia 09/04/2014    History obtained from: chart review and patient.  Ashley [name] was referred by Irving CopasHACKER,ROBERT KELLER, MD.     Ashley PortsValerie is a 61 y.o. female presenting for a follow up visit. The patient was last seen over 2 years ago by Dr. Willa RoughHicks, who has since left the practice. At that time, she recommended continuing Clarinex, Qvar, and as needed [employer] Air. She was planning to decrease Ashley [name] Flonase 2 daily instead of twice daily. She was supposed to follow-up in December 2015, but instead shows up today more than  2 years later.  Since last visit, she reports that she has remained stable. She is being followed by pulmonologist (Dr. Delton Coombes) now to help with Ashley [name] asthma. She remains on Qvar 2 puffs in the morning and 2 puffs at night. She is not using a spacer. She also remains on Singulair 10 mg daily. She estimates that she uses albuterol around once per week but  will use it more frequently when she gets cold.  Ashley [name] allergies are not well controlled. Currently she is using Flonase 2 sprays per nostril once daily in addition to Clarinex. If she gets off of the Clarinex, she develops a "fountain in the back of [Ashley [name]] throat". she is interested today in starting allergy shots. However, review of Ashley [name] last testing from May 2015 shows that she had no evidence of allergies on skin prick testing as well as intradermal. She finds this very frustrating, as she knows that dust, animals, grass, and mildew make Ashley [name] symptoms worse. She did have allergy shots several years ago, probably in the 1990s, which she continued for 5 years or more.   Ashley [name] is followed by ENT (Dr. Ezzard Standing). She last saw him 2 months ago. She has had a turbinectomy in the past. She also has a history of fibromyalgia and is followed by Dr. Claudette Laws.   Otherwise, there have been no changes to Ashley [name] past medical history, surgical history, family history, or social history.She is always worked in Clinical biochemist. She worked at [employer] for a period of time and then she worked for [employer]. Now she is working in the [employer] She is working on Ashley [name] masters in Optician, dispensing and has one more class to finish.    Review of Systems: a 14-point review of systems is pertinent for what is mentioned in HPI.  Otherwise, all other systems were negative. Constitutional: negative other than that listed in the HPI Eyes: negative other than that listed in the HPI Ears, nose, mouth, throat, and face: negative other than that listed in the HPI Respiratory: negative other than that listed in the HPI Cardiovascular: negative other than that listed in the HPI Gastrointestinal: negative other than that listed in the HPI Genitourinary: negative other than that listed in the HPI Integument: negative other than that listed in the HPI Hematologic: negative other than that listed in  the HPI Musculoskeletal: negative other than that listed in the HPI Neurological: negative other than that listed in the HPI Allergy/Immunologic: negative other than that listed in the HPI    Objective:   Blood pressure 118/70, pulse 98, temperature 97.5 F (36.4 C), temperature source Oral, resp. rate 18, height 5\' 9"  (1.753 m), weight 220 lb (99.8 kg), SpO2 98 %. Body mass index is 32.49 kg/m.   Physical Exam:  General: Alert, interactive, in no acute distress. Pleasant but obviously uncomfortable from Ashley [name] nasal symptoms. Eyes: No conjunctival injection present on the right, No conjunctival injection present on the left, PERRL bilaterally, No discharge on the right, No discharge on the left and No Horner-Trantas dots present Ears: Right TM pearly gray with normal light reflex, Right OME, Right TM intact without perforation and Left TM intact without perforation.  Nose/Throat: External nose within normal limits, nasal crease present and septum midline, turbinates markedly edematous and pale with clear discharge, post-pharynx erythematous with cobblestoning in the posterior oropharynx. Tonsils 2+ without exudates Neck: Supple without thyromegaly. Lungs: Clear to auscultation without wheezing, rhonchi or rales. No increased work of breathing. CV:  Normal S1/S2, no murmurs. Capillary refill <2 seconds.  Skin: Warm and dry, without lesions or rashes. Neuro:   Grossly intact. No focal deficits appreciated. Responsive to questions.   Diagnostic studies:  Spirometry: results normal (FEV1: 2.35/98%, FVC: 2.95/98%, FEV1/FVC: 79%).    Spirometry consistent with normal pattern  Allergy Studies: none performed since she had recently taken Clarinex    Malachi Bonds, MD [facility]

## 2016-11-11 DIAGNOSIS — M545 Low back pain: Secondary | ICD-10-CM | POA: Diagnosis not present

## 2016-11-11 DIAGNOSIS — M2550 Pain in unspecified joint: Secondary | ICD-10-CM | POA: Diagnosis not present

## 2016-11-14 DIAGNOSIS — J029 Acute pharyngitis, unspecified: Secondary | ICD-10-CM | POA: Diagnosis not present

## 2016-11-24 DIAGNOSIS — J300 Vasomotor rhinitis: Secondary | ICD-10-CM | POA: Diagnosis not present

## 2016-11-25 LAB — CP584 ZONE 3
Allergen, A. alternata, m6: <0.1 kU/L
Allergen, C. Herbarum, M2: <0.1 kU/L
Allergen, Mucor Racemosus, M4: <0.1 kU/L
Allergen, Mulberry, t76: <0.1 kU/L
Allergen, S. Botryosum, m10: <0.1 kU/L
Aspergillus fumigatus, m3: <0.1 kU/L
Bahia Grass: <0.1 kU/L
Box Elder IgE: <0.1 kU/L
Cockroach: <0.1 kU/L
Johnson Grass: <0.1 kU/L
Meadow Grass: <0.1 kU/L
Nettle: <0.1 kU/L
Pecan/Hickory Tree IgE: <0.1 kU/L
Plantain: <0.1 kU/L
Rough Pigweed  IgE: <0.1 kU/L

## 2016-11-26 DIAGNOSIS — R0981 Nasal congestion: Secondary | ICD-10-CM | POA: Diagnosis not present

## 2016-11-27 ENCOUNTER — Telehealth: Payer: Self-pay

## 2016-11-27 NOTE — Telephone Encounter (Signed)
On November 27, 2016 NCCSR was reviewed. No conflict was seen on the [facility] [place] Reporting System with multiple prescribers. If there are any discrepancies this will be reported her Physician.

## 2017-02-06 ENCOUNTER — Encounter: Payer: Self-pay | Admitting: Physical Therapy

## 2017-02-06 ENCOUNTER — Ambulatory Visit: Payer: 59 | Attending: Orthopedic Surgery | Admitting: Physical Therapy

## 2017-02-06 ENCOUNTER — Ambulatory Visit: Payer: 59 | Admitting: Physical Therapy

## 2017-02-06 DIAGNOSIS — M79672 Pain in left foot: Secondary | ICD-10-CM

## 2017-02-06 NOTE — Therapy (Signed)
[facility] [address] [place], [place], [address] Phone: [phone]   Fax:  [phone]  Physical Therapy Evaluation  Patient Details  Name: Ashley [name] MRN: [id number] Date of Birth: 04/18/1956 Referring Provider: Jackelyn Hoehn, MD  Encounter Date: 02/06/2017      PT End of Session - 02/06/17 0917    Visit Number 1   Authorization Type one time visit for heel lift   PT Start Time 0900   PT Stop Time 0912   PT Time Calculation (min) 12 min   Activity Tolerance Patient tolerated treatment well   Behavior During Therapy [facility] for tasks assessed/performed      Past Medical History:  Diagnosis Date  . Arthritis   . Fibromyalgia 09/04/2014  . Headache   . Hypertension   . Lumbar radicular pain 09/04/2014    Past Surgical History:  Procedure Laterality Date  . ABDOMINAL HYSTERECTOMY    . BREAST SURGERY    . BUNIONECTOMY    . ESOPHAGEAL MANOMETRY [name] 11/21/2013   Procedure: ESOPHAGEAL MANOMETRY (EM);  Surgeon: Shirley Friar, MD;  Location: [facility];  Service: Endoscopy;  Laterality: [name];  . KNEE ARTHROSCOPY W/ MENISCAL REPAIR    . LUMBAR LAMINECTOMY    . NASAL SINUS SURGERY      There were no vitals filed for this visit.       Subjective Assessment - 02/06/17 0913    Subjective Pt reports lumbar laminectomy in 2002 and L SIJ fusion in Jan 2018, diagnosed with scoliosis. Has felt like she is stepping in a hole since fusion. Heel lift was placed in L shoe after laminectomy to compensate for scoliosis and just wants a new lift today, is being treated elsewhere for PT.    Patient Stated Goals new heel lift            [facility] PT Assessment - 02/06/17 0001      Assessment   Medical Diagnosis pain in L foot- fit for heel lift   Referring Provider Jackelyn Hoehn, MD   Prior Therapy currently being treated elsewhere     Cognition   Overall Cognitive Status Within Functional Limits for tasks  assessed                           PT Education - 02/06/17 0916    Education provided Yes   Education Details structural v functional LLD, use of heel lift   Person(s) Educated Patient   Methods Explanation   Comprehension Verbalized understanding                    Plan - 02/06/17 0917    Clinical Impression Statement Pt presents to PT today with request for new heel lift in L shoe. Pt leg length measures equal with apparent anterior rotation of R innominate. Old heel lift was very worn and flattened. 3 layer lift was placed in L shoe. Pt was educated on use of heel lift and should she want to have something built that is more permanent, she would need a referral to [facility]. Pt reported feeling much better and did not feel that she was stepping in a hole anymore. Pt instructed to contact us with any further questions.    PT Treatment/Interventions ADLs/Self Care Home Management;Patient/family education   Consulted and Agree with Plan of Care Patient      Patient will benefit from skilled therapeutic intervention in order to  improve the following deficits and impairments:  Other (comment) (fit for heel lift)  Visit Diagnosis: Pain in left foot - Plan: PT plan of care cert/re-cert     Problem List Patient Active Problem List   Diagnosis Date Noted  . Spondylosis of lumbar region without myelopathy or radiculopathy 05/16/2016  . Generalized headaches 04/21/2016  . History of migraine 04/21/2016  . Chronic low back pain with left-sided sciatica 03/24/2016  . Intrinsic asthma 09/06/2014  . GERD (gastroesophageal reflux disease) 09/06/2014  . Allergic rhinitis 09/06/2014  . Lumbar radicular pain 09/04/2014  . Fibromyalgia 09/04/2014    Iara Monds C. Ervie Mccard PT, DPT 02/06/17 9:23 AM   [facility] [address] [place], [place], [address] Phone: [phone]   Fax:  [phone]  Name: Ashley [name] MRN: [id number] Date of Birth: [birth year]

## 2017-02-24 DIAGNOSIS — R600 Localized edema: Secondary | ICD-10-CM | POA: Diagnosis not present

## 2017-02-27 DIAGNOSIS — M65331 Trigger finger, right middle finger: Secondary | ICD-10-CM | POA: Diagnosis not present

## 2017-02-27 DIAGNOSIS — M65332 Trigger finger, left middle finger: Secondary | ICD-10-CM | POA: Diagnosis not present

## 2017-02-27 DIAGNOSIS — M180 Bilateral primary osteoarthritis of first carpometacarpal joints: Secondary | ICD-10-CM | POA: Diagnosis not present

## 2017-02-27 DIAGNOSIS — M79644 Pain in right finger(s): Secondary | ICD-10-CM | POA: Diagnosis not present

## 2017-03-05 DIAGNOSIS — M79645 Pain in left finger(s): Secondary | ICD-10-CM | POA: Diagnosis not present

## 2017-03-05 DIAGNOSIS — M180 Bilateral primary osteoarthritis of first carpometacarpal joints: Secondary | ICD-10-CM | POA: Diagnosis not present

## 2017-03-10 ENCOUNTER — Other Ambulatory Visit (HOSPITAL_COMMUNITY): Payer: Self-pay | Admitting: Family Medicine

## 2017-03-10 DIAGNOSIS — M79605 Pain in left leg: Secondary | ICD-10-CM

## 2017-03-10 DIAGNOSIS — M7989 Other specified soft tissue disorders: Principal | ICD-10-CM

## 2017-03-11 ENCOUNTER — Ambulatory Visit (HOSPITAL_COMMUNITY)
Admission: RE | Admit: 2017-03-11 | Discharge: 2017-03-11 | Disposition: A | Discharge status: Home / Self Care | Payer: Federal, State, Local not specified - PPO | Source: Ambulatory Visit | Attending: Family Medicine | Admitting: Family Medicine

## 2017-03-11 DIAGNOSIS — M79605 Pain in left leg: Secondary | ICD-10-CM | POA: Diagnosis not present

## 2017-03-11 DIAGNOSIS — M7989 Other specified soft tissue disorders: Secondary | ICD-10-CM | POA: Diagnosis not present

## 2017-03-11 NOTE — Progress Notes (Signed)
*  PRELIMINARY RESULTS* Vascular Ultrasound Left lower extremity venous duplex has been completed.  Preliminary findings: No evidence of DVT or baker's cyst.   Called results to GaysJessica.   Farrel DemarkJill Eunice, RDMS, RVT  03/11/2017, 4:18 PM

## 2017-03-13 ENCOUNTER — Ambulatory Visit: Payer: 59 | Admitting: Physical Medicine & Rehabilitation

## 2017-03-13 DIAGNOSIS — I10 Essential (primary) hypertension: Secondary | ICD-10-CM | POA: Insufficient documentation

## 2017-03-13 DIAGNOSIS — M797 Fibromyalgia: Secondary | ICD-10-CM | POA: Insufficient documentation

## 2017-03-13 DIAGNOSIS — Z79899 Other long term (current) drug therapy: Secondary | ICD-10-CM | POA: Insufficient documentation

## 2017-03-13 DIAGNOSIS — M533 Sacrococcygeal disorders, not elsewhere classified: Secondary | ICD-10-CM | POA: Insufficient documentation

## 2017-03-13 DIAGNOSIS — M5442 Lumbago with sciatica, left side: Secondary | ICD-10-CM | POA: Insufficient documentation

## 2017-03-13 DIAGNOSIS — M79641 Pain in right hand: Secondary | ICD-10-CM | POA: Insufficient documentation

## 2017-03-13 DIAGNOSIS — G8929 Other chronic pain: Secondary | ICD-10-CM | POA: Insufficient documentation

## 2017-03-13 DIAGNOSIS — M79642 Pain in left hand: Secondary | ICD-10-CM | POA: Insufficient documentation

## 2017-03-13 DIAGNOSIS — Z87891 Personal history of nicotine dependence: Secondary | ICD-10-CM | POA: Insufficient documentation

## 2017-03-16 ENCOUNTER — Encounter: Payer: Self-pay | Admitting: Physical Medicine & Rehabilitation

## 2017-03-16 ENCOUNTER — Ambulatory Visit (HOSPITAL_BASED_OUTPATIENT_CLINIC_OR_DEPARTMENT_OTHER): Payer: Federal, State, Local not specified - PPO | Admitting: Physical Medicine & Rehabilitation

## 2017-03-16 ENCOUNTER — Encounter: Payer: Federal, State, Local not specified - PPO | Attending: Physical Medicine & Rehabilitation

## 2017-03-16 VITALS — BP 111/60 | HR 91

## 2017-03-16 DIAGNOSIS — M797 Fibromyalgia: Secondary | ICD-10-CM | POA: Diagnosis not present

## 2017-03-16 DIAGNOSIS — M5442 Lumbago with sciatica, left side: Secondary | ICD-10-CM

## 2017-03-16 DIAGNOSIS — Z87891 Personal history of nicotine dependence: Secondary | ICD-10-CM | POA: Diagnosis not present

## 2017-03-16 DIAGNOSIS — Z79899 Other long term (current) drug therapy: Secondary | ICD-10-CM | POA: Diagnosis not present

## 2017-03-16 DIAGNOSIS — G8929 Other chronic pain: Secondary | ICD-10-CM

## 2017-03-16 DIAGNOSIS — M79642 Pain in left hand: Secondary | ICD-10-CM

## 2017-03-16 DIAGNOSIS — M533 Sacrococcygeal disorders, not elsewhere classified: Secondary | ICD-10-CM | POA: Diagnosis not present

## 2017-03-16 DIAGNOSIS — I10 Essential (primary) hypertension: Secondary | ICD-10-CM | POA: Diagnosis not present

## 2017-03-16 DIAGNOSIS — M79641 Pain in right hand: Secondary | ICD-10-CM

## 2017-03-16 MED ORDER — GABAPENTIN 600 MG PO TABS: 600.0000 mg | ORAL_TABLET | Freq: Three times a day (TID) | ORAL | 1 refills | Status: DC

## 2017-03-16 MED ORDER — DICLOFENAC SODIUM 1 % TD GEL: 2.0000 g | Freq: Four times a day (QID) | TRANSDERMAL | 1 refills | Status: DC

## 2017-03-16 NOTE — Patient Instructions (Signed)
TUMERIC try it!

## 2017-03-16 NOTE — Progress Notes (Signed)
Subjective:    Patient ID: Ashley [name], female    DOB: 02-17-1956, 61 y.o.   MRN: [id number]  HPI SI joint fusion Left  Dec 02 2016, still getting some PT, walking is now without a cane.  Has had some Left foot swelling post op, will f/u with PCP.  Still taking baclofen for muscle spasms in both legs Gabapentin 800mg  3 times a day making her drowsy and gain weight  She states she is tired of taking a lot of medicines and is wondering what she can do to get off of medication. She stopped all of her medicine for pain over the weekend, but this made her pain worse. We discussed my recommendation not to stop all medications, but instead we can target a certain medication and wean down to see if this medication is still needed.   Seen by hand surgery for trigger finger as well as pain in the carpal metacarpal joints. Diagnosed with arthritic changes in the carpal metacarpal joints and underwent left long finger and right long finger tendon sheath injections   Pain Inventory Average Pain 7 Pain Right Now 6 My pain is dull and aching  In the last 24 hours, has pain interfered with the following? General activity 7 Relation with others 4 Enjoyment of life 7 What TIME of day is your pain at its worst? all times Sleep (in general) Poor  Pain is worse with: sitting, standing and some activites Pain improves with: rest, therapy/exercise and TENS Relief from Meds: 5  Mobility ability to climb steps?  yes do you drive?  yes Do you have any goals in this area?  yes  Function employed # of hrs/week 40 I need assistance with the following:  dressing, meal prep and household duties Do you have any goals in this area?  yes  Neuro/Psych spasms dizziness  Prior Studies Any changes since last visit?  no  Physicians involved in your care Any changes since last visit?  no   Family History  Problem Relation Age of Onset  . Aneurysm Father   . Bipolar disorder Brother   .  Diabetes Brother   . Migraines Mother   . Eczema Sister   . Urticaria Sister   . Migraines Daughter   . Urticaria Daughter   . Asthma Daughter   . Cancer Other   . Hypertension Other   . Diabetes Other   . Allergic rhinitis Neg Hx   . Angioedema Neg Hx   . Immunodeficiency Neg Hx    Social History   Social History  . Marital status: Married    Spouse name: [name]  . Number of children: [name]  . Years of education: [name]   Social History Main Topics  . Smoking status: Former Smoker    Packs/day: 0.75    Years: 24.00    Types: Cigarettes    Quit date: 11/03/1992  . Smokeless tobacco: Never Used  . Alcohol use 0.6 oz/week    1 Glasses of wine per week     Comment: occasionally  . Drug use: No  . Sexual activity: Not on file   Other Topics Concern  . Not on file   Social History Narrative  . No narrative on file   Past Surgical History:  Procedure Laterality Date  . ABDOMINAL HYSTERECTOMY    . BREAST SURGERY    . BUNIONECTOMY    . ESOPHAGEAL MANOMETRY [name] 11/21/2013   Procedure: ESOPHAGEAL MANOMETRY (EM);  Surgeon: Shirley FriarVincent C. Schooler, MD;  Location: [facility];  Service: Endoscopy;  Laterality: [name];  . KNEE ARTHROSCOPY W/ MENISCAL REPAIR    . LUMBAR LAMINECTOMY    . NASAL SINUS SURGERY     Past Medical History:  Diagnosis Date  . Arthritis   . Fibromyalgia 09/04/2014  . Headache   . Hypertension   . Lumbar radicular pain 09/04/2014   There were no vitals taken for this visit.  Opioid Risk Score:   Fall Risk Score:  `1  Depression screen PHQ 2/9  Depression screen [facility] 2/9 03/10/2016 06/29/2014  Decreased Interest 3 0  Down, Depressed, Hopeless 2 0  PHQ - 2 Score 5 0  Altered sleeping 3 -  Tired, decreased energy 3 -  Change in appetite 3 -  Feeling bad or failure about yourself  0 -  Trouble concentrating 3 -  Moving slowly or fidgety/restless 3 -  Suicidal thoughts 0 -  PHQ-9 Score 20 -  Difficult doing work/chores Extremely dIfficult -    Review of  Systems  Constitutional: Negative.   HENT: Negative.   Eyes: Negative.   Respiratory: Negative.   Cardiovascular: Negative.   Gastrointestinal: Positive for constipation.  Endocrine: Negative.   Genitourinary: Negative.   Musculoskeletal: Positive for back pain.  Allergic/Immunologic: Negative.   Neurological: Positive for dizziness.  Hematological: Negative.   Psychiatric/Behavioral: Negative.        Objective:   Physical Exam  Constitutional: She is oriented to person, place, and time. She appears well-developed and well-nourished.  HENT:  Head: Normocephalic and atraumatic.  Eyes: Conjunctivae and EOM are normal. Pupils are equal, round, and reactive to light.  Neck: Normal range of motion.  Musculoskeletal:  Palpation for fibromyalgia tender points positive only in the lumbosacral area  Lumbar range of motion flexion is 75%, extension is 0-25%. Lateral bending is 0-25%, twisting, 0-25%  Neurological: She is alert and oriented to person, place, and time. She has normal strength. Gait normal.  Motor strength is 5/5 bilateral deltoid by stress, grip, hip flexion, knee extension, ankle dorsiflexion  Psychiatric: She has a normal mood and affect. Her behavior is normal. Judgment and thought content normal.   No tenderness over the left PSIS  No tenderness to palpation in bilateral MCP, PIP and DIP joints     Assessment & Plan:  1. Fibromyalgia syndrome, continue Cymbalta 60 mg at bedtime, also on gabapentin 800 mg 3 times a day this appears to be relatively well controlled. Because of concerns about sedation from gabapentin. We'll reduce to 600 mg 3 times a day, which is usually a therapeutic dose with fibromyalgia patients.  2. Bilateral hand pain. Some of this is swelling that may be medication related. Specifically, we discussed the gabapentin at the high dose. In addition, she does have carpometacarpal arthritis as well as trigger fingers, but her complaints are more  diffuse. She has no joint pain.  She plans to follow-up with hand surgery, discussed trial of diclofenac gel

## 2017-03-20 DIAGNOSIS — M130 Polyarthritis, unspecified: Secondary | ICD-10-CM | POA: Diagnosis not present

## 2017-04-03 ENCOUNTER — Other Ambulatory Visit: Payer: Self-pay | Admitting: Nurse Practitioner

## 2017-04-15 DIAGNOSIS — M7711 Lateral epicondylitis, right elbow: Secondary | ICD-10-CM | POA: Diagnosis not present

## 2017-04-20 ENCOUNTER — Telehealth: Payer: Self-pay | Admitting: Physical Medicine & Rehabilitation

## 2017-04-20 ENCOUNTER — Ambulatory Visit (HOSPITAL_BASED_OUTPATIENT_CLINIC_OR_DEPARTMENT_OTHER): Payer: Federal, State, Local not specified - PPO | Admitting: Physical Medicine & Rehabilitation

## 2017-04-20 ENCOUNTER — Encounter: Payer: Federal, State, Local not specified - PPO | Attending: Physical Medicine & Rehabilitation

## 2017-04-20 ENCOUNTER — Encounter: Payer: Self-pay | Admitting: Physical Medicine & Rehabilitation

## 2017-04-20 DIAGNOSIS — Z87891 Personal history of nicotine dependence: Secondary | ICD-10-CM | POA: Diagnosis not present

## 2017-04-20 DIAGNOSIS — M797 Fibromyalgia: Secondary | ICD-10-CM

## 2017-04-20 DIAGNOSIS — I10 Essential (primary) hypertension: Secondary | ICD-10-CM | POA: Insufficient documentation

## 2017-04-20 DIAGNOSIS — Z79899 Other long term (current) drug therapy: Secondary | ICD-10-CM | POA: Insufficient documentation

## 2017-04-20 DIAGNOSIS — M79641 Pain in right hand: Secondary | ICD-10-CM | POA: Diagnosis not present

## 2017-04-20 DIAGNOSIS — G8929 Other chronic pain: Secondary | ICD-10-CM | POA: Insufficient documentation

## 2017-04-20 DIAGNOSIS — M5442 Lumbago with sciatica, left side: Secondary | ICD-10-CM | POA: Insufficient documentation

## 2017-04-20 DIAGNOSIS — M79642 Pain in left hand: Secondary | ICD-10-CM | POA: Diagnosis not present

## 2017-04-20 DIAGNOSIS — M47816 Spondylosis without myelopathy or radiculopathy, lumbar region: Secondary | ICD-10-CM | POA: Diagnosis not present

## 2017-04-20 DIAGNOSIS — M533 Sacrococcygeal disorders, not elsewhere classified: Secondary | ICD-10-CM | POA: Diagnosis not present

## 2017-04-20 MED ORDER — GABAPENTIN 800 MG PO TABS: 800.0000 mg | ORAL_TABLET | Freq: Three times a day (TID) | ORAL | 1 refills | Status: DC

## 2017-04-20 NOTE — Progress Notes (Signed)
Subjective:    Patient ID: Ashley [name], female    DOB: October 01, 1956, 61 y.o.   MRN: [id number]  HPI Off topiramate but overall HA improved  No regular exercise  Continues to work full-time.  Rarely takes Tylenol with Codeine, patient states she does not like to take narcotic analgesics. She still has some old hydrocodone and oxycodone tablets at home from prior prescriptions from other doctors.  Baclofen 20 mg taken as needed, not on a regular basis.  Continues on Cymbalta 60 mg daily at bedtime.  Tried reducing her gabapentin from 800 3 times a day to 600 3 times a day, with some increased pain   Pain Inventory Average Pain 7 Pain Right Now 7 My pain is constant and aching  In the last 24 hours, has pain interfered with the following? General activity 8 Relation with others 8 Enjoyment of life 9 What TIME of day is your pain at its worst? evening Sleep (in general) Fair  Pain is worse with: walking, bending, sitting and some activites Pain improves with: rest, heat/ice, therapy/exercise and TENS Relief from Meds: 4  Mobility walk without assistance ability to climb steps?  yes do you drive?  yes  Function employed # of hrs/week 40 I need assistance with the following:  dressing, meal prep and household duties  Neuro/Psych weakness tingling spasms  Prior Studies Any changes since last visit?  no  Physicians involved in your care Any changes since last visit?  no   Family History  Problem Relation Age of Onset  . Aneurysm Father   . Bipolar disorder Brother   . Diabetes Brother   . Migraines Mother   . Eczema Sister   . Urticaria Sister   . Migraines Daughter   . Urticaria Daughter   . Asthma Daughter   . Cancer Other   . Hypertension Other   . Diabetes Other   . Allergic rhinitis Neg Hx   . Angioedema Neg Hx   . Immunodeficiency Neg Hx    Social History   Social History  . Marital status: Married    Spouse name: [name]  . Number  of children: [name]  . Years of education: [name]   Social History Main Topics  . Smoking status: Former Smoker    Packs/day: 0.75    Years: 24.00    Types: Cigarettes    Quit date: 11/03/1992  . Smokeless tobacco: Never Used  . Alcohol use 0.6 oz/week    1 Glasses of wine per week     Comment: occasionally  . Drug use: No  . Sexual activity: Not on file   Other Topics Concern  . Not on file   Social History Narrative  . No narrative on file   Past Surgical History:  Procedure Laterality Date  . ABDOMINAL HYSTERECTOMY    . BREAST SURGERY    . BUNIONECTOMY    . ESOPHAGEAL MANOMETRY [name] 11/21/2013   Procedure: ESOPHAGEAL MANOMETRY (EM);  Surgeon: Shirley FriarVincent C. Schooler, MD;  Location: [facility];  Service: Endoscopy;  Laterality: [name];  . KNEE ARTHROSCOPY W/ MENISCAL REPAIR    . LUMBAR LAMINECTOMY    . NASAL SINUS SURGERY     Past Medical History:  Diagnosis Date  . Arthritis   . Fibromyalgia 09/04/2014  . Headache   . Hypertension   . Lumbar radicular pain 09/04/2014   There were no vitals taken for this visit.  Opioid Risk Score:   Fall Risk Score:  `1  Depression screen  PHQ 2/9  Depression screen [facility] 2/9 03/10/2016 06/29/2014  Decreased Interest 3 0  Down, Depressed, Hopeless 2 0  PHQ - 2 Score 5 0  Altered sleeping 3 -  Tired, decreased energy 3 -  Change in appetite 3 -  Feeling bad or failure about yourself  0 -  Trouble concentrating 3 -  Moving slowly or fidgety/restless 3 -  Suicidal thoughts 0 -  PHQ-9 Score 20 -  Difficult doing work/chores Extremely dIfficult -    Review of Systems  Constitutional: Positive for diaphoresis.  HENT: Negative.   Eyes: Negative.   Respiratory: Negative.   Cardiovascular: Negative.   Gastrointestinal: Negative.   Endocrine: Negative.   Genitourinary: Negative.   Musculoskeletal: Positive for back pain.  Skin: Negative.   Allergic/Immunologic: Negative.   Neurological: Negative.   Hematological: Negative.     Psychiatric/Behavioral: Negative.   All other systems reviewed and are negative.      Objective:   Physical Exam  Constitutional: She is oriented to person, place, and time. She appears well-developed and well-nourished.  HENT:  Head: Normocephalic and atraumatic.  Eyes: Conjunctivae and EOM are normal. Pupils are equal, round, and reactive to light.  Neck: Normal range of motion.  Musculoskeletal:       Right hip: She exhibits tenderness.       Left hip: She exhibits tenderness.       Cervical back: She exhibits tenderness.       Thoracic back: She exhibits tenderness.       Lumbar back: She exhibits tenderness.  Patient has tenderness to palpation, 18/18 positive tender points  Neurological: She is alert and oriented to person, place, and time.  Motor strength is 5/5 bilateral deltoids by stress, grip, hip flexion and extensor, ankle dorsiflexor, plantar flexor.   Psychiatric: She has a normal mood and affect. Her behavior is normal. Judgment and thought content normal.  Nursing note and vitals reviewed.         Assessment & Plan:  1. Fibromyalgia syndrome. This is her underlying pain diagnosis. She would benefit from increased dose of gabapentin from 600-800 mg 3 times a day. Continue Cymbalta 60 mg per day.  Patient with occasional use of opioids. Not on a daily basis. Would like patient use up her current home medications before prescribing any more Tylenol with Codeine  2. Sacroiliac disorder, status post sacroiliac fusion

## 2017-05-05 DIAGNOSIS — R5382 Chronic fatigue, unspecified: Secondary | ICD-10-CM | POA: Diagnosis not present

## 2017-05-05 DIAGNOSIS — M2550 Pain in unspecified joint: Secondary | ICD-10-CM | POA: Diagnosis not present

## 2017-05-05 DIAGNOSIS — M7989 Other specified soft tissue disorders: Secondary | ICD-10-CM | POA: Diagnosis not present

## 2017-05-08 ENCOUNTER — Other Ambulatory Visit: Payer: Self-pay | Admitting: Family Medicine

## 2017-05-08 DIAGNOSIS — Z1231 Encounter for screening mammogram for malignant neoplasm of breast: Secondary | ICD-10-CM

## 2017-05-09 ENCOUNTER — Other Ambulatory Visit: Payer: Self-pay | Admitting: Allergy & Immunology

## 2017-05-11 ENCOUNTER — Ambulatory Visit
Admission: RE | Admit: 2017-05-11 | Discharge: 2017-05-11 | Disposition: A | Discharge status: Home / Self Care | Payer: Federal, State, Local not specified - PPO | Source: Ambulatory Visit | Attending: Family Medicine | Admitting: Family Medicine

## 2017-05-11 DIAGNOSIS — Z1231 Encounter for screening mammogram for malignant neoplasm of breast: Secondary | ICD-10-CM | POA: Diagnosis not present

## 2017-05-13 DIAGNOSIS — M7711 Lateral epicondylitis, right elbow: Secondary | ICD-10-CM | POA: Diagnosis not present

## 2017-05-15 NOTE — Telephone Encounter (Signed)
Made in error

## 2017-05-28 DIAGNOSIS — M2550 Pain in unspecified joint: Secondary | ICD-10-CM | POA: Diagnosis not present

## 2017-05-28 DIAGNOSIS — M7989 Other specified soft tissue disorders: Secondary | ICD-10-CM | POA: Diagnosis not present

## 2017-05-28 DIAGNOSIS — R5382 Chronic fatigue, unspecified: Secondary | ICD-10-CM | POA: Diagnosis not present

## 2017-06-14 ENCOUNTER — Other Ambulatory Visit: Payer: Self-pay | Admitting: Allergy & Immunology

## 2017-06-25 DIAGNOSIS — R232 Flushing: Secondary | ICD-10-CM | POA: Diagnosis not present

## 2017-06-25 DIAGNOSIS — Z01419 Encounter for gynecological examination (general) (routine) without abnormal findings: Secondary | ICD-10-CM | POA: Diagnosis not present

## 2017-07-02 ENCOUNTER — Telehealth: Payer: Self-pay | Admitting: Physical Medicine & Rehabilitation

## 2017-07-02 NOTE — Telephone Encounter (Signed)
Patient has called back regarding her physician note. Patient was advised per Dr. Wynn BankerKirsteins that it was not medically necessary. Patient was very upset and stated she would like an appointment to discuss "health issues and concerns". Patient has an upcoming appointment scheduled for September 4th, 2018.

## 2017-07-02 NOTE — Telephone Encounter (Signed)
Patient states the cold air that circulates on the floor bothers her fibromyalgia an would like a physician note for her employer stating she needs a floor heater.  She also states she needs a new referral to [facility] for - physical and massage therapy she had stopped because of surgery and would like to start back.

## 2017-07-02 NOTE — Telephone Encounter (Signed)
Not medically necessary

## 2017-07-06 ENCOUNTER — Other Ambulatory Visit: Payer: Self-pay | Admitting: Emergency Medicine

## 2017-07-07 ENCOUNTER — Encounter: Payer: Self-pay | Admitting: Physical Medicine & Rehabilitation

## 2017-07-07 ENCOUNTER — Encounter: Payer: Federal, State, Local not specified - PPO | Attending: Physical Medicine & Rehabilitation

## 2017-07-07 ENCOUNTER — Ambulatory Visit (HOSPITAL_BASED_OUTPATIENT_CLINIC_OR_DEPARTMENT_OTHER): Payer: Federal, State, Local not specified - PPO | Admitting: Physical Medicine & Rehabilitation

## 2017-07-07 VITALS — BP 112/75 | HR 90

## 2017-07-07 DIAGNOSIS — M797 Fibromyalgia: Secondary | ICD-10-CM | POA: Diagnosis not present

## 2017-07-07 DIAGNOSIS — Z87891 Personal history of nicotine dependence: Secondary | ICD-10-CM | POA: Diagnosis not present

## 2017-07-07 DIAGNOSIS — M79641 Pain in right hand: Secondary | ICD-10-CM | POA: Insufficient documentation

## 2017-07-07 DIAGNOSIS — M5442 Lumbago with sciatica, left side: Secondary | ICD-10-CM | POA: Diagnosis not present

## 2017-07-07 DIAGNOSIS — G8929 Other chronic pain: Secondary | ICD-10-CM

## 2017-07-07 DIAGNOSIS — M79642 Pain in left hand: Secondary | ICD-10-CM | POA: Insufficient documentation

## 2017-07-07 DIAGNOSIS — M545 Low back pain: Secondary | ICD-10-CM

## 2017-07-07 DIAGNOSIS — Z79899 Other long term (current) drug therapy: Secondary | ICD-10-CM | POA: Diagnosis not present

## 2017-07-07 DIAGNOSIS — I10 Essential (primary) hypertension: Secondary | ICD-10-CM | POA: Diagnosis not present

## 2017-07-07 DIAGNOSIS — M533 Sacrococcygeal disorders, not elsewhere classified: Secondary | ICD-10-CM | POA: Insufficient documentation

## 2017-07-07 MED ORDER — NAPROXEN SODIUM 550 MG PO TABS: 550.0000 mg | ORAL_TABLET | Freq: Two times a day (BID) | ORAL | 1 refills | Status: DC

## 2017-07-07 MED ORDER — GABAPENTIN 800 MG PO TABS: 800.0000 mg | ORAL_TABLET | Freq: Three times a day (TID) | ORAL | 5 refills | Status: AC

## 2017-07-07 NOTE — Patient Instructions (Addendum)
[facility] [address] [phone] Aquatic therapy, please progress to land-based therapy. 2-3 times per week x3 weeks 

## 2017-07-07 NOTE — Progress Notes (Signed)
Subjective:    Patient ID: Ashley ClackValerie [name], female    DOB: [birth year], 61 y.o.   MRN: [id number]  HPI 61 year old female with history of fibromyalgia. She complains of cold drafts that cause ankle and foot discomfort bilaterally during work and requested medical necessity letter for a space heater under her desk.  We discussed that there is no firm medical evidence that fibromyalgia patients require higher temperatures on the legs  We discussed her medications has been done a little bit better on the higher dose of gabapentin 800 3 times a day She consistently takes her baclofen 20 g 3 times a day. Neurology prescribed Pamelor 25 mg daily at bedtime  She is interested in trying aquatic therapy for her low back pain. She has a history of sacroiliac fusion. Pain Inventory Average Pain 8 Pain Right Now 9 My pain is burning, stabbing and aching  In the last 24 hours, has pain interfered with the following? General activity 9 Relation with others 9 Enjoyment of life 9 What TIME of day is your pain at its worst? all Sleep (in general) Fair  Pain is worse with: walking, bending, sitting and standing Pain improves with: rest and TENS Relief from Meds: 6  Mobility walk without assistance ability to climb steps?  yes do you drive?  yes  Function employed # of hrs/week 40  Neuro/Psych trouble walking spasms  Prior Studies Any changes since last visit?  no  Physicians involved in your care Any changes since last visit?  no   Family History  Problem Relation Age of Onset  . Aneurysm Father   . Bipolar disorder Brother   . Diabetes Brother   . Migraines Mother   . Eczema Sister   . Urticaria Sister   . Migraines Daughter   . Urticaria Daughter   . Asthma Daughter   . Cancer Other   . Hypertension Other   . Diabetes Other   . Allergic rhinitis Neg Hx   . Angioedema Neg Hx   . Immunodeficiency Neg Hx    Social History   Social History  . Marital status:  Married    Spouse name: [name]  . Number of children: [name]  . Years of education: [name]   Social History Main Topics  . Smoking status: Former Smoker    Packs/day: 0.75    Years: 24.00    Types: Cigarettes    Quit date: 11/03/1992  . Smokeless tobacco: Never Used  . Alcohol use 0.6 oz/week    1 Glasses of wine per week     Comment: occasionally  . Drug use: No  . Sexual activity: Not on file   Other Topics Concern  . Not on file   Social History Narrative  . No narrative on file   Past Surgical History:  Procedure Laterality Date  . ABDOMINAL HYSTERECTOMY    . BREAST LUMPECTOMY Left 1985  . BREAST SURGERY    . BUNIONECTOMY    . ESOPHAGEAL MANOMETRY [name] 11/21/2013   Procedure: ESOPHAGEAL MANOMETRY (EM);  Surgeon: Shirley FriarVincent C. Schooler, MD;  Location: [facility];  Service: Endoscopy;  Laterality: [name];  . KNEE ARTHROSCOPY W/ MENISCAL REPAIR    . LUMBAR LAMINECTOMY    . NASAL SINUS SURGERY     Past Medical History:  Diagnosis Date  . Arthritis   . Fibromyalgia 09/04/2014  . Headache   . Hypertension   . Lumbar radicular pain 09/04/2014   There were no vitals taken for this visit.  Opioid  Risk Score:   Fall Risk Score:  `1  Depression screen PHQ 2/9  Depression screen [facility] 2/9 03/10/2016 06/29/2014  Decreased Interest 3 0  Down, Depressed, Hopeless 2 0  PHQ - 2 Score 5 0  Altered sleeping 3 -  Tired, decreased energy 3 -  Change in appetite 3 -  Feeling bad or failure about yourself  0 -  Trouble concentrating 3 -  Moving slowly or fidgety/restless 3 -  Suicidal thoughts 0 -  PHQ-9 Score 20 -  Difficult doing work/chores Extremely dIfficult -     Review of Systems  Constitutional: Positive for diaphoresis.  HENT: Negative.   Eyes: Negative.   Respiratory: Positive for shortness of breath.   Cardiovascular: Negative.   Gastrointestinal: Negative.   Endocrine: Negative.   Genitourinary: Negative.   Musculoskeletal: Negative.   Skin: Negative.     Allergic/Immunologic: Negative.   Neurological: Negative.   Hematological: Negative.   Psychiatric/Behavioral: Negative.   All other systems reviewed and are negative.      Objective:   Physical Exam  Constitutional: She is oriented to person, place, and time. She appears well-developed and well-nourished.  HENT:  Head: Normocephalic and atraumatic.  Eyes: Pupils are equal, round, and reactive to light. Conjunctivae are normal.  Neurological: She is alert and oriented to person, place, and time.  Psychiatric: She has a normal mood and affect.  Nursing note and vitals reviewed.  18/18 was positive. There is pain, right-sided PSIS with flexion-extension lumbar spine  Negative straight leg raising test. Strength is 5/5 bilateral deltoid by stress of grip, hip flexion, extension, ankle dorsiflexion. Gait is normal      Assessment & Plan:  1. Fibromyalgia syndrome, continue gabapentin 800 mg 3 times a day, recommend regular exercise, but she does not keep up with a home exercise program.  She may benefit from some acupuncture to help modulate her pain, she was trying to set up at integrative therapy with Dr. Wilma Flavin  2. Chronic low back pain, history of sacroiliac fusion, which has been a partial benefit. We'll make referral to [facility]

## 2017-07-17 DIAGNOSIS — E669 Obesity, unspecified: Secondary | ICD-10-CM | POA: Diagnosis not present

## 2017-07-17 DIAGNOSIS — I10 Essential (primary) hypertension: Secondary | ICD-10-CM | POA: Diagnosis not present

## 2017-07-19 ENCOUNTER — Other Ambulatory Visit: Payer: Self-pay | Admitting: Nurse Practitioner

## 2017-07-20 DIAGNOSIS — M5417 Radiculopathy, lumbosacral region: Secondary | ICD-10-CM | POA: Diagnosis not present

## 2017-07-20 DIAGNOSIS — M545 Low back pain: Secondary | ICD-10-CM | POA: Diagnosis not present

## 2017-07-20 DIAGNOSIS — M6281 Muscle weakness (generalized): Secondary | ICD-10-CM | POA: Diagnosis not present

## 2017-07-20 DIAGNOSIS — R293 Abnormal posture: Secondary | ICD-10-CM | POA: Diagnosis not present

## 2017-07-22 DIAGNOSIS — R293 Abnormal posture: Secondary | ICD-10-CM | POA: Diagnosis not present

## 2017-07-22 DIAGNOSIS — M6281 Muscle weakness (generalized): Secondary | ICD-10-CM | POA: Diagnosis not present

## 2017-07-22 DIAGNOSIS — M545 Low back pain: Secondary | ICD-10-CM | POA: Diagnosis not present

## 2017-07-22 DIAGNOSIS — M5417 Radiculopathy, lumbosacral region: Secondary | ICD-10-CM | POA: Diagnosis not present

## 2017-07-27 DIAGNOSIS — M6281 Muscle weakness (generalized): Secondary | ICD-10-CM | POA: Diagnosis not present

## 2017-07-27 DIAGNOSIS — M545 Low back pain: Secondary | ICD-10-CM | POA: Diagnosis not present

## 2017-07-27 DIAGNOSIS — M5417 Radiculopathy, lumbosacral region: Secondary | ICD-10-CM | POA: Diagnosis not present

## 2017-07-27 DIAGNOSIS — R293 Abnormal posture: Secondary | ICD-10-CM | POA: Diagnosis not present

## 2017-07-28 ENCOUNTER — Ambulatory Visit (INDEPENDENT_AMBULATORY_CARE_PROVIDER_SITE_OTHER): Payer: Federal, State, Local not specified - PPO | Admitting: Emergency Medicine

## 2017-07-28 ENCOUNTER — Encounter: Payer: Self-pay | Admitting: Emergency Medicine

## 2017-07-28 DIAGNOSIS — J301 Allergic rhinitis due to pollen: Secondary | ICD-10-CM | POA: Diagnosis not present

## 2017-07-28 DIAGNOSIS — J45909 Unspecified asthma, uncomplicated: Secondary | ICD-10-CM

## 2017-07-28 MED ORDER — BECLOMETHASONE DIPROPIONATE 80 MCG/ACT IN AERS: 1.0000 | INHALATION_SPRAY | Freq: Two times a day (BID) | RESPIRATORY_TRACT | 0 refills | Status: DC

## 2017-07-28 MED ORDER — ALBUTEROL SULFATE HFA 108 (90 BASE) MCG/ACT IN AERS: 1.0000 | INHALATION_SPRAY | Freq: Four times a day (QID) | RESPIRATORY_TRACT | 6 refills | Status: DC | PRN

## 2017-07-28 NOTE — Assessment & Plan Note (Signed)
Less effective control of her rhinitis, cough, chest congestion, dyspnea. She is now off inhaled steroid for 6 months. Also under treating her allergic rhinitis. We will restart Qvar 80 g, one puff twice a day. Assess her improvement in approximately one month.

## 2017-07-28 NOTE — Progress Notes (Signed)
>65.78LeRoss MarcusaveRae [name].57[place] y G>XTTAG>EXTTAG>S olleen Caneminole ManorWillow OraHerold HarmsTharon [phone] >65.78 TatamyRae LipsRoss MarcusLeone Haven29.533w4dChestine SporeReino Kent16.1 [name] [name] LaneDorthea CoveKoreaTime WarnerColleen CanAlineWillow OraHerold HarmsTharon [phone] >65.78 CanonesRae LipsRoss MarcusLeone Haven29.558w4dChestine SporeReino Kent16.1 [name] [name]Dorthea CoveKoreaTime WarnerColleen CanBryn MawrWillow OraHerold HarmsTharon [phone]  Arthritis   . Fibromyalgia 09/04/2014  . Headache   . Hypertension   . Lumbar radicular pain 09/04/2014     Family History  Problem Relation Age of Onset  . Aneurysm Father   . Bipolar disorder Brother   . Diabetes Brother   . Migraines Mother   . Eczema Sister   . Urticaria Sister   . Migraines Daughter   . Urticaria Daughter   . Asthma Daughter   . Cancer Other   . Hypertension Other   . Diabetes Other   . Allergic rhinitis Neg Hx   . Angioedema Neg Hx   . Immunodeficiency Neg Hx      Social History   Social History  . Marital status: Married    Spouse name: [name]  . Number of children: [name]  . Years of education: [name]   Occupational History  . Not on file.   Social History Main Topics  . Smoking status: Former Smoker    Packs/day: 0.75    Years: 24.00    Types: Cigarettes    Quit date: 11/03/1992  . Smokeless tobacco: Never Used  . Alcohol use 0.6 oz/week    1 Glasses of wine per week     Comment: occasionally  . Drug use: No  . Sexual activity: Not on file   Other Topics Concern  . Not on file   Social History Narrative  . No narrative on file     Allergies  Allergen Reactions  . Latex Itching, Swelling and Other (See Comments)    redness  . Sulfa Antibiotics Diarrhea  and Nausea And Vomiting  . Aspirin Other (See Comments)    Can cause her to bleed out due to G6 PD  . Oxycodone Itching    "all over"  . Tramadol Other (See Comments)    Makes her feel "spaced out on street drugs"     Outpatient Medications Prior to Visit  Medication Sig Dispense Refill  . acetaminophen-codeine (TYLENOL #3) 300-30 MG tablet Take by mouth 2 (two) times daily as needed for moderate pain.    Marland Kitchen albuterol (PROVENTIL HFA;VENTOLIN HFA) 108 (90 BASE) MCG/ACT inhaler Inhale into the lungs every 6 (six) hours as needed for wheezing or shortness of breath.    Marland Kitchen albuterol (PROVENTIL) (2.5 MG/3ML) 0.083% nebulizer solution USE 1 VIAL VIA NEBULIZER EVERY 4 HOURS AS NEEDED FOR WHEEZING OR SHORTNESS OF BREATH 150 mL 2  . azelastine (OPTIVAR) 0.05 % ophthalmic solution Place 1 drop into both eyes 2 (two) times daily.    . baclofen (LIORESAL) 20 MG tablet Take 1 tablet (20 mg total) by mouth 3 (three) times daily. 15 each 0  . beclomethasone (QVAR) 80 MCG/ACT inhaler Inhale 2 puffs into the lungs 2 (two) times daily. 1 Inhaler 0  . benzonatate (TESSALON) 200 MG capsule Take 1 capsule (200 mg total) by mouth 3 (three) times daily as needed for cough. 40 capsule 0  . desloratadine (CLARINEX) 5 MG tablet Take 5 mg by mouth daily.    . diclofenac sodium (VOLTAREN) 1 % GEL Apply 2 g topically 4 (four) times daily. 3 Tube 1  . DULoxetine (CYMBALTA) 60 MG capsule Take 60 mg by mouth at bedtime.    . fluticasone (FLONASE) 50 MCG/ACT nasal spray Place 2 sprays into both nostrils 2 (two) times daily. 48 g 3  . gabapentin (NEURONTIN) 800 MG tablet Take 1 tablet (800 mg total) by mouth 3 (three) times daily. 90 tablet 5  . hydrochlorothiazide (  HYDRODIURIL) 25 MG tablet Take 25 mg by mouth daily.     . methocarbamol (ROBAXIN) 500 MG tablet Take 500 mg by mouth every 8 (eight) hours as needed for muscle spasms.    . montelukast (SINGULAIR) 10 MG tablet TAKE 1 TABLET BY MOUTH AT BEDTIME 30 tablet 0  .  naproxen sodium (ANAPROX) 550 MG tablet Take 1 tablet (550 mg total) by mouth 2 (two) times daily with a meal. 60 tablet 1  . nortriptyline (PAMELOR) 25 MG capsule Take  at hs too drowsy on  30 capsule 6  . nortriptyline (PAMELOR) 25 MG capsule Take 1 capsule (25 mg total) by mouth at bedtime. 30 capsule 0  . ondansetron (ZOFRAN) 4 MG tablet Take 1 tablet (4 mg total) by mouth every 8 (eight) hours as needed for nausea or vomiting. 20 tablet 1  . pantoprazole (PROTONIX) 40 MG tablet Take 40 mg by mouth daily.    Marland Kitchen topiramate (TOPAMAX) 25 MG tablet 2 at bedtime 60 tablet 6  . Azelastine-Fluticasone (DYMISTA) 137-50 MCG/ACT SUSP Place 2 sprays into both nostrils 2 (two) times daily. 1 Bottle 5  . tiZANidine (ZANAFLEX) 2 MG tablet      No facility-administered medications prior to visit.         Objective:   Physical Exam Vitals:   07/28/17 1537 07/28/17 1538  BP:  122/68  Pulse:  82  SpO2:  96%  Weight: 230 lb (104.3 kg)    Gen: Pleasant, well-nourished, in no distress,  normal affect  ENT: No lesions,  mouth clear,  oropharynx clear, no postnasal drip  Neck: No JVD, no TMG, no carotid bruits  Lungs: No use of accessory muscles,  clear without rales or rhonchi  Cardiovascular: RRR, heart sounds normal, no murmur or gallops, no peripheral edema  Musculoskeletal: No deformities, no cyanosis or clubbing  Neuro: alert, non focal  Skin: Warm, no lesions or rashes      Assessment & Plan:  Intrinsic asthma Less effective control of her rhinitis, cough, chest congestion, dyspnea. She is now off inhaled steroid for 6 months. Also under treating her allergic rhinitis. We will restart Qvar 80 g, one puff twice a day. Assess her improvement in approximately one month.  Allergic rhinitis Add back fluticasone nasal spray to her existing regimen.  Levy Pupa, MD, PhD 07/28/2017, 4:04 PM [facility] [phone] or if no answer [phone]

## 2017-07-28 NOTE — Patient Instructions (Signed)
Please start Qvar Redihaler 80 g, one puff twice a day.  Please restart fluticasone nasal spray, 2 sprays each nostril once a day Continue nasal saline washes, Clarinex, Singulair Take albuterol 2 puffs up to every 4 hours if needed for shortness of breath. We will refill this for you Follow with Dr Delton Coombes in 1 month to ensure that you are improving on these medications

## 2017-07-28 NOTE — Assessment & Plan Note (Signed)
Add back fluticasone nasal spray to her existing regimen.

## 2017-07-29 DIAGNOSIS — M6281 Muscle weakness (generalized): Secondary | ICD-10-CM | POA: Diagnosis not present

## 2017-07-29 DIAGNOSIS — M5417 Radiculopathy, lumbosacral region: Secondary | ICD-10-CM | POA: Diagnosis not present

## 2017-07-29 DIAGNOSIS — M545 Low back pain: Secondary | ICD-10-CM | POA: Diagnosis not present

## 2017-07-29 DIAGNOSIS — R293 Abnormal posture: Secondary | ICD-10-CM | POA: Diagnosis not present

## 2017-08-03 DIAGNOSIS — M7711 Lateral epicondylitis, right elbow: Secondary | ICD-10-CM | POA: Diagnosis not present

## 2017-08-26 DIAGNOSIS — M5417 Radiculopathy, lumbosacral region: Secondary | ICD-10-CM | POA: Diagnosis not present

## 2017-08-26 DIAGNOSIS — R293 Abnormal posture: Secondary | ICD-10-CM | POA: Diagnosis not present

## 2017-08-26 DIAGNOSIS — M545 Low back pain: Secondary | ICD-10-CM | POA: Diagnosis not present

## 2017-08-26 DIAGNOSIS — M6281 Muscle weakness (generalized): Secondary | ICD-10-CM | POA: Diagnosis not present

## 2017-08-27 ENCOUNTER — Ambulatory Visit (INDEPENDENT_AMBULATORY_CARE_PROVIDER_SITE_OTHER): Payer: Federal, State, Local not specified - PPO | Admitting: Emergency Medicine

## 2017-08-27 ENCOUNTER — Encounter: Payer: Self-pay | Admitting: Emergency Medicine

## 2017-08-27 DIAGNOSIS — K219 Gastro-esophageal reflux disease without esophagitis: Secondary | ICD-10-CM | POA: Diagnosis not present

## 2017-08-27 DIAGNOSIS — J45909 Unspecified asthma, uncomplicated: Secondary | ICD-10-CM | POA: Diagnosis not present

## 2017-08-27 DIAGNOSIS — J301 Allergic rhinitis due to pollen: Secondary | ICD-10-CM

## 2017-08-27 MED ORDER — FLUTICASONE PROPIONATE 50 MCG/ACT NA SUSP: 2.0000 | Freq: Two times a day (BID) | NASAL | 3 refills | Status: AC

## 2017-08-27 NOTE — Assessment & Plan Note (Signed)
Continue pantoprazole as ordered 

## 2017-08-27 NOTE — Assessment & Plan Note (Signed)
She benefited from adding back fluticasone nasal spray to her existing Clarinex, Singulair.  Continue same regimen.  She wonders whether she might benefit from a repeat allergy appointment, skin testing.  I have asked her to think about this, let me know if she would like to pursue.

## 2017-08-27 NOTE — Progress Notes (Signed)
h Subjective:    Patient ID: Ashley [name], female    DOB: [birth year], 61 y.o.   MRN: [id number]  Asthma  She complains of cough and shortness of breath. There is no wheezing. Associated symptoms include headaches. Pertinent negatives include no ear pain, fever, postnasal drip, rhinorrhea, sneezing, sore throat or trouble swallowing. Her past medical history is significant for asthma.   61 yo woman, former smoker (20 pk-yrs), hx HTN, Allergies, GERD. Has been given dx of asthma associated with her GERD.    ROV 10/20/16 -- patient has a history of former tobacco, asthma,  GERD and allergic rhinitis. PFT from 02/06/15 showed grossly normal spirometry but with curve of the flow volume loop suggestive of mild obstruction. She has been managed with Clarinex, nasal saline washes, fluticasone nasal spray and Protonix. She is on QVAR 40mcg 4 puffs bid. She had run out of QVAR for a few months - restarted as above. Feels some dry throat. Minimal wheeze, cough, albuterol use. She would like to simplify her daily regimen if possible.   ROV 07/28/17 -- This is a follow-up visit for patient with a history of mild obstructive lung disease, former tobacco and suspected asthma. Exacerbated by GERD and allergic rhinitis. She has noticed more sputum in the am for the last 3 weeks, seems to happen some at work. She is having more nasal congestion and drainage. She stopped Qvar about 6 months ago - felt that her breathing got a little worse. She still does nasal saline, clarinex, singulair. She is off fluticasone nasal spray. She uses albuterol about 2x a week.    ROV 08/27/17 --she has a history of mild intermittent asthma with exacerbating factors including allergic rhinitis and GERD.  I saw her one month ago and she was clearly having poor control. We added back QVAR, flonase. She remained on protonix, singulair, clarinex. She has albuterol, has only needed twice since last visit.                                                                                                                                                              Review of Systems  Constitutional: Negative for fever and unexpected weight change.  HENT: Negative for congestion, dental problem, ear pain, nosebleeds, postnasal drip, rhinorrhea, sinus pressure, sneezing, sore throat and trouble swallowing.   Eyes: Negative for redness and itching.  Respiratory: Positive for cough and shortness of breath. Negative for choking, chest tightness and wheezing.   Cardiovascular: Negative for palpitations and leg swelling.  Gastrointestinal: Negative for nausea and vomiting.  Genitourinary: Negative for dysuria.  Musculoskeletal: Negative for joint swelling.  Skin: Negative for rash.  Neurological: Positive for headaches.  Hematological: Does not bruise/bleed easily.  Psychiatric/Behavioral: Negative for dysphoric mood. The patient is not nervous/anxious.  Objective:   Physical Exam Vitals:   08/27/17 1642  BP: 122/70  Pulse: 80  SpO2: 98%  Weight: 226 lb 12.8 oz (102.9 kg)  Height: 5\' 9"  (1.753 m)   Gen: Pleasant, well-nourished, in no distress,  normal affect  ENT: No lesions,  mouth clear,  oropharynx clear, no postnasal drip  Neck: No JVD, no TMG, no carotid bruits  Lungs: No use of accessory muscles,  clear without rales or rhonchi  Cardiovascular: RRR, heart sounds normal, no murmur or gallops, no peripheral edema  Musculoskeletal: No deformities, no cyanosis or clubbing  Neuro: alert, non focal  Skin: Warm, no lesions or rashes      Assessment & Plan:  Intrinsic asthma Better control with the re-addition of Qvar.  She also benefited from better control of her allergic rhinitis.  GERD (gastroesophageal reflux disease) Continue pantoprazole as ordered  Allergic rhinitis She benefited from adding back fluticasone nasal spray to her existing Clarinex, Singulair.  Continue same regimen.  She wonders  whether she might benefit from a repeat allergy appointment, skin testing.  I have asked her to think about this, let me know if she would like to pursue.  Levy Pupa, MD, PhD 08/27/2017, 5:05 PM [facility] [phone] or if no answer [phone]

## 2017-08-27 NOTE — Assessment & Plan Note (Signed)
Better control with the re-addition of Qvar.  She also benefited from better control of her allergic rhinitis.

## 2017-09-02 DIAGNOSIS — R293 Abnormal posture: Secondary | ICD-10-CM | POA: Diagnosis not present

## 2017-09-02 DIAGNOSIS — M545 Low back pain: Secondary | ICD-10-CM | POA: Diagnosis not present

## 2017-09-02 DIAGNOSIS — M5417 Radiculopathy, lumbosacral region: Secondary | ICD-10-CM | POA: Diagnosis not present

## 2017-09-02 DIAGNOSIS — M6281 Muscle weakness (generalized): Secondary | ICD-10-CM | POA: Diagnosis not present

## 2017-09-07 DIAGNOSIS — R293 Abnormal posture: Secondary | ICD-10-CM | POA: Diagnosis not present

## 2017-09-07 DIAGNOSIS — M5417 Radiculopathy, lumbosacral region: Secondary | ICD-10-CM | POA: Diagnosis not present

## 2017-09-07 DIAGNOSIS — M6281 Muscle weakness (generalized): Secondary | ICD-10-CM | POA: Diagnosis not present

## 2017-09-07 DIAGNOSIS — M7711 Lateral epicondylitis, right elbow: Secondary | ICD-10-CM | POA: Diagnosis not present

## 2017-09-07 DIAGNOSIS — M545 Low back pain: Secondary | ICD-10-CM | POA: Diagnosis not present

## 2017-09-09 DIAGNOSIS — R293 Abnormal posture: Secondary | ICD-10-CM | POA: Diagnosis not present

## 2017-09-09 DIAGNOSIS — M545 Low back pain: Secondary | ICD-10-CM | POA: Diagnosis not present

## 2017-09-09 DIAGNOSIS — M6281 Muscle weakness (generalized): Secondary | ICD-10-CM | POA: Diagnosis not present

## 2017-09-09 DIAGNOSIS — M5417 Radiculopathy, lumbosacral region: Secondary | ICD-10-CM | POA: Diagnosis not present

## 2017-09-16 DIAGNOSIS — M6281 Muscle weakness (generalized): Secondary | ICD-10-CM | POA: Diagnosis not present

## 2017-09-16 DIAGNOSIS — R293 Abnormal posture: Secondary | ICD-10-CM | POA: Diagnosis not present

## 2017-09-16 DIAGNOSIS — M5417 Radiculopathy, lumbosacral region: Secondary | ICD-10-CM | POA: Diagnosis not present

## 2017-09-16 DIAGNOSIS — M545 Low back pain: Secondary | ICD-10-CM | POA: Diagnosis not present

## 2017-10-01 DIAGNOSIS — M7711 Lateral epicondylitis, right elbow: Secondary | ICD-10-CM | POA: Diagnosis not present

## 2017-10-01 DIAGNOSIS — M65331 Trigger finger, right middle finger: Secondary | ICD-10-CM | POA: Diagnosis not present

## 2017-10-06 ENCOUNTER — Ambulatory Visit: Payer: Federal, State, Local not specified - PPO

## 2017-10-06 ENCOUNTER — Ambulatory Visit: Payer: Federal, State, Local not specified - PPO | Admitting: Physical Medicine & Rehabilitation

## 2017-10-06 DIAGNOSIS — M6281 Muscle weakness (generalized): Secondary | ICD-10-CM | POA: Diagnosis not present

## 2017-10-06 DIAGNOSIS — M5417 Radiculopathy, lumbosacral region: Secondary | ICD-10-CM | POA: Diagnosis not present

## 2017-10-06 DIAGNOSIS — M545 Low back pain: Secondary | ICD-10-CM | POA: Diagnosis not present

## 2017-10-06 DIAGNOSIS — R293 Abnormal posture: Secondary | ICD-10-CM | POA: Diagnosis not present

## 2017-10-08 DIAGNOSIS — M5417 Radiculopathy, lumbosacral region: Secondary | ICD-10-CM | POA: Diagnosis not present

## 2017-10-08 DIAGNOSIS — M2550 Pain in unspecified joint: Secondary | ICD-10-CM | POA: Diagnosis not present

## 2017-10-08 DIAGNOSIS — E669 Obesity, unspecified: Secondary | ICD-10-CM | POA: Diagnosis not present

## 2017-10-08 DIAGNOSIS — M545 Low back pain: Secondary | ICD-10-CM | POA: Diagnosis not present

## 2017-10-08 DIAGNOSIS — R5382 Chronic fatigue, unspecified: Secondary | ICD-10-CM | POA: Diagnosis not present

## 2017-10-08 DIAGNOSIS — M7989 Other specified soft tissue disorders: Secondary | ICD-10-CM | POA: Diagnosis not present

## 2017-10-08 DIAGNOSIS — R293 Abnormal posture: Secondary | ICD-10-CM | POA: Diagnosis not present

## 2017-10-08 DIAGNOSIS — M6281 Muscle weakness (generalized): Secondary | ICD-10-CM | POA: Diagnosis not present

## 2017-10-09 DIAGNOSIS — M7711 Lateral epicondylitis, right elbow: Secondary | ICD-10-CM | POA: Diagnosis not present

## 2017-10-18 ENCOUNTER — Encounter: Payer: Self-pay | Admitting: Emergency Medicine

## 2017-10-18 ENCOUNTER — Other Ambulatory Visit: Payer: Self-pay | Admitting: Allergy & Immunology

## 2017-10-19 ENCOUNTER — Telehealth: Payer: Self-pay | Admitting: Emergency Medicine

## 2017-10-19 MED ORDER — MONTELUKAST SODIUM 10 MG PO TABS: 10.0000 mg | ORAL_TABLET | Freq: Every day | ORAL | 1 refills | Status: DC

## 2017-10-19 NOTE — Telephone Encounter (Signed)
  Hello Dr. Delton CoombesByrum,  My treatment plan as you prescribed includes rinsing my sinuses every morning before using the nasal spray and Qvar.I should be reimbursed for the [employer] Sinus Rinse when I purchase it; however, my federal healthcare spending account (FSA) requires something in writing from you.I checked on line for an after visit summary that includes your instructions that I do rinse my sinuses but cannot find one online.Will you please send something that specifies my having to use the rinse on a daily basis.If possible, can you send it to my email or post as a plan of care or after visit summary.I can print and send to my FSA upon receipt.Thank you.  Ashley [name] [email or web address] [phone]   RB please advise. Thanks.

## 2017-10-19 NOTE — Telephone Encounter (Signed)
Spoke with pt. She is needing a refill on Singulair. Rx has been sent in. Nothing further was needed. 

## 2017-10-20 DIAGNOSIS — M545 Low back pain: Secondary | ICD-10-CM | POA: Diagnosis not present

## 2017-10-20 DIAGNOSIS — R293 Abnormal posture: Secondary | ICD-10-CM | POA: Diagnosis not present

## 2017-10-20 DIAGNOSIS — M6281 Muscle weakness (generalized): Secondary | ICD-10-CM | POA: Diagnosis not present

## 2017-10-20 DIAGNOSIS — M5417 Radiculopathy, lumbosacral region: Secondary | ICD-10-CM | POA: Diagnosis not present

## 2017-10-22 DIAGNOSIS — M6281 Muscle weakness (generalized): Secondary | ICD-10-CM | POA: Diagnosis not present

## 2017-10-22 DIAGNOSIS — M545 Low back pain: Secondary | ICD-10-CM | POA: Diagnosis not present

## 2017-10-22 DIAGNOSIS — M5417 Radiculopathy, lumbosacral region: Secondary | ICD-10-CM | POA: Diagnosis not present

## 2017-10-22 DIAGNOSIS — R293 Abnormal posture: Secondary | ICD-10-CM | POA: Diagnosis not present

## 2017-10-29 DIAGNOSIS — R293 Abnormal posture: Secondary | ICD-10-CM | POA: Diagnosis not present

## 2017-10-29 DIAGNOSIS — M5417 Radiculopathy, lumbosacral region: Secondary | ICD-10-CM | POA: Diagnosis not present

## 2017-10-29 DIAGNOSIS — M545 Low back pain: Secondary | ICD-10-CM | POA: Diagnosis not present

## 2017-10-29 DIAGNOSIS — M6281 Muscle weakness (generalized): Secondary | ICD-10-CM | POA: Diagnosis not present

## 2017-10-30 ENCOUNTER — Telehealth: Payer: Self-pay | Admitting: Emergency Medicine

## 2017-10-30 MED ORDER — BECLOMETHASONE DIPROPIONATE 80 MCG/ACT IN AERS: 1.0000 | INHALATION_SPRAY | Freq: Two times a day (BID) | RESPIRATORY_TRACT | 5 refills | Status: DC

## 2017-10-30 NOTE — Telephone Encounter (Signed)
Spoke with the pt to verify the msg  I have refilled her Qvar  Nothing further needed

## 2017-11-02 NOTE — Telephone Encounter (Signed)
Okay to write a letter that I believe her nasal saline rinses are medically necessary and should be therefore be appropriate for her to purchase via her federal healthcare spending account.  Thank you

## 2017-11-04 DIAGNOSIS — M7711 Lateral epicondylitis, right elbow: Secondary | ICD-10-CM | POA: Diagnosis not present

## 2017-11-05 DIAGNOSIS — M545 Low back pain: Secondary | ICD-10-CM | POA: Diagnosis not present

## 2017-11-05 DIAGNOSIS — M6281 Muscle weakness (generalized): Secondary | ICD-10-CM | POA: Diagnosis not present

## 2017-11-05 DIAGNOSIS — R293 Abnormal posture: Secondary | ICD-10-CM | POA: Diagnosis not present

## 2017-11-05 DIAGNOSIS — M5417 Radiculopathy, lumbosacral region: Secondary | ICD-10-CM | POA: Diagnosis not present

## 2017-11-10 DIAGNOSIS — M6281 Muscle weakness (generalized): Secondary | ICD-10-CM | POA: Diagnosis not present

## 2017-11-10 DIAGNOSIS — R293 Abnormal posture: Secondary | ICD-10-CM | POA: Diagnosis not present

## 2017-11-10 DIAGNOSIS — M545 Low back pain: Secondary | ICD-10-CM | POA: Diagnosis not present

## 2017-11-10 DIAGNOSIS — M5417 Radiculopathy, lumbosacral region: Secondary | ICD-10-CM | POA: Diagnosis not present

## 2017-11-17 DIAGNOSIS — M25532 Pain in left wrist: Secondary | ICD-10-CM | POA: Diagnosis not present

## 2017-11-17 DIAGNOSIS — M25522 Pain in left elbow: Secondary | ICD-10-CM | POA: Diagnosis not present

## 2017-11-17 DIAGNOSIS — M6281 Muscle weakness (generalized): Secondary | ICD-10-CM | POA: Diagnosis not present

## 2017-11-17 DIAGNOSIS — M25542 Pain in joints of left hand: Secondary | ICD-10-CM | POA: Diagnosis not present

## 2017-11-19 DIAGNOSIS — M25522 Pain in left elbow: Secondary | ICD-10-CM | POA: Diagnosis not present

## 2017-11-19 DIAGNOSIS — M25542 Pain in joints of left hand: Secondary | ICD-10-CM | POA: Diagnosis not present

## 2017-11-19 DIAGNOSIS — M6281 Muscle weakness (generalized): Secondary | ICD-10-CM | POA: Diagnosis not present

## 2017-11-19 DIAGNOSIS — M25532 Pain in left wrist: Secondary | ICD-10-CM | POA: Diagnosis not present

## 2017-11-24 DIAGNOSIS — R293 Abnormal posture: Secondary | ICD-10-CM | POA: Diagnosis not present

## 2017-11-24 DIAGNOSIS — M25532 Pain in left wrist: Secondary | ICD-10-CM | POA: Diagnosis not present

## 2017-11-24 DIAGNOSIS — M6281 Muscle weakness (generalized): Secondary | ICD-10-CM | POA: Diagnosis not present

## 2017-11-24 DIAGNOSIS — M25522 Pain in left elbow: Secondary | ICD-10-CM | POA: Diagnosis not present

## 2017-11-24 DIAGNOSIS — M545 Low back pain: Secondary | ICD-10-CM | POA: Diagnosis not present

## 2017-11-24 DIAGNOSIS — M5417 Radiculopathy, lumbosacral region: Secondary | ICD-10-CM | POA: Diagnosis not present

## 2017-11-24 DIAGNOSIS — M25542 Pain in joints of left hand: Secondary | ICD-10-CM | POA: Diagnosis not present

## 2017-11-26 DIAGNOSIS — M25522 Pain in left elbow: Secondary | ICD-10-CM | POA: Diagnosis not present

## 2017-11-26 DIAGNOSIS — R293 Abnormal posture: Secondary | ICD-10-CM | POA: Diagnosis not present

## 2017-11-26 DIAGNOSIS — M545 Low back pain: Secondary | ICD-10-CM | POA: Diagnosis not present

## 2017-11-26 DIAGNOSIS — M6281 Muscle weakness (generalized): Secondary | ICD-10-CM | POA: Diagnosis not present

## 2017-11-26 DIAGNOSIS — M5417 Radiculopathy, lumbosacral region: Secondary | ICD-10-CM | POA: Diagnosis not present

## 2017-11-26 DIAGNOSIS — M25532 Pain in left wrist: Secondary | ICD-10-CM | POA: Diagnosis not present

## 2017-11-26 DIAGNOSIS — M25542 Pain in joints of left hand: Secondary | ICD-10-CM | POA: Diagnosis not present

## 2017-12-01 DIAGNOSIS — M6281 Muscle weakness (generalized): Secondary | ICD-10-CM | POA: Diagnosis not present

## 2017-12-01 DIAGNOSIS — M25532 Pain in left wrist: Secondary | ICD-10-CM | POA: Diagnosis not present

## 2017-12-01 DIAGNOSIS — M25542 Pain in joints of left hand: Secondary | ICD-10-CM | POA: Diagnosis not present

## 2017-12-01 DIAGNOSIS — M25522 Pain in left elbow: Secondary | ICD-10-CM | POA: Diagnosis not present

## 2017-12-08 DIAGNOSIS — M25542 Pain in joints of left hand: Secondary | ICD-10-CM | POA: Diagnosis not present

## 2017-12-08 DIAGNOSIS — M25532 Pain in left wrist: Secondary | ICD-10-CM | POA: Diagnosis not present

## 2017-12-08 DIAGNOSIS — M6281 Muscle weakness (generalized): Secondary | ICD-10-CM | POA: Diagnosis not present

## 2017-12-08 DIAGNOSIS — M25522 Pain in left elbow: Secondary | ICD-10-CM | POA: Diagnosis not present

## 2017-12-11 DIAGNOSIS — R509 Fever, unspecified: Secondary | ICD-10-CM | POA: Diagnosis not present

## 2017-12-11 DIAGNOSIS — J029 Acute pharyngitis, unspecified: Secondary | ICD-10-CM | POA: Diagnosis not present

## 2017-12-15 DIAGNOSIS — M25532 Pain in left wrist: Secondary | ICD-10-CM | POA: Diagnosis not present

## 2017-12-15 DIAGNOSIS — M6281 Muscle weakness (generalized): Secondary | ICD-10-CM | POA: Diagnosis not present

## 2017-12-15 DIAGNOSIS — M25542 Pain in joints of left hand: Secondary | ICD-10-CM | POA: Diagnosis not present

## 2017-12-15 DIAGNOSIS — M25522 Pain in left elbow: Secondary | ICD-10-CM | POA: Diagnosis not present

## 2017-12-16 DIAGNOSIS — M65331 Trigger finger, right middle finger: Secondary | ICD-10-CM | POA: Diagnosis not present

## 2017-12-17 DIAGNOSIS — M545 Low back pain: Secondary | ICD-10-CM | POA: Diagnosis not present

## 2017-12-17 DIAGNOSIS — J069 Acute upper respiratory infection, unspecified: Secondary | ICD-10-CM | POA: Diagnosis not present

## 2017-12-17 DIAGNOSIS — M5417 Radiculopathy, lumbosacral region: Secondary | ICD-10-CM | POA: Diagnosis not present

## 2017-12-17 DIAGNOSIS — J111 Influenza due to unidentified influenza virus with other respiratory manifestations: Secondary | ICD-10-CM | POA: Diagnosis not present

## 2017-12-17 DIAGNOSIS — M6281 Muscle weakness (generalized): Secondary | ICD-10-CM | POA: Diagnosis not present

## 2017-12-17 DIAGNOSIS — R293 Abnormal posture: Secondary | ICD-10-CM | POA: Diagnosis not present

## 2017-12-22 DIAGNOSIS — J22 Unspecified acute lower respiratory infection: Secondary | ICD-10-CM | POA: Diagnosis not present

## 2017-12-22 DIAGNOSIS — R05 Cough: Secondary | ICD-10-CM | POA: Diagnosis not present

## 2017-12-22 DIAGNOSIS — J111 Influenza due to unidentified influenza virus with other respiratory manifestations: Secondary | ICD-10-CM | POA: Diagnosis not present

## 2017-12-22 DIAGNOSIS — R6883 Chills (without fever): Secondary | ICD-10-CM | POA: Diagnosis not present

## 2018-01-21 DIAGNOSIS — M1812 Unilateral primary osteoarthritis of first carpometacarpal joint, left hand: Secondary | ICD-10-CM | POA: Diagnosis not present

## 2018-01-22 DIAGNOSIS — M797 Fibromyalgia: Secondary | ICD-10-CM | POA: Diagnosis not present

## 2018-01-22 DIAGNOSIS — I10 Essential (primary) hypertension: Secondary | ICD-10-CM | POA: Diagnosis not present

## 2018-02-01 DIAGNOSIS — R739 Hyperglycemia, unspecified: Secondary | ICD-10-CM | POA: Diagnosis not present

## 2018-02-02 DIAGNOSIS — M2550 Pain in unspecified joint: Secondary | ICD-10-CM | POA: Diagnosis not present

## 2018-02-02 DIAGNOSIS — M545 Low back pain: Secondary | ICD-10-CM | POA: Diagnosis not present

## 2018-02-08 DIAGNOSIS — M545 Low back pain: Secondary | ICD-10-CM | POA: Diagnosis not present

## 2018-02-08 DIAGNOSIS — M2550 Pain in unspecified joint: Secondary | ICD-10-CM | POA: Diagnosis not present

## 2018-02-11 DIAGNOSIS — M545 Low back pain: Secondary | ICD-10-CM | POA: Diagnosis not present

## 2018-02-11 DIAGNOSIS — M2550 Pain in unspecified joint: Secondary | ICD-10-CM | POA: Diagnosis not present

## 2018-02-15 ENCOUNTER — Telehealth: Payer: Self-pay | Admitting: Emergency Medicine

## 2018-02-15 DIAGNOSIS — M2550 Pain in unspecified joint: Secondary | ICD-10-CM | POA: Diagnosis not present

## 2018-02-15 DIAGNOSIS — M545 Low back pain: Secondary | ICD-10-CM | POA: Diagnosis not present

## 2018-02-15 NOTE — Telephone Encounter (Signed)
Please make sure she is using flonase two sprays in each nostril daily, using singulair 10 mg nightly, and clarinex daily.  She should also be using nasal irrigation daily.  Can send script for astelin two sprays in nostril bid.  If her allergy symptoms don't improve, then she should call back to get an appointment.

## 2018-02-15 NOTE — Telephone Encounter (Signed)
Spoke with patient. She stated that she has been taking all of her medications.   She has been scheduled with [facility] as an acute pt since neither RB or NPs had any openings until May 2019.   Nothing else needed at time of call.

## 2018-02-15 NOTE — Telephone Encounter (Signed)
Spoke with patient. She stated that she has dealing with post nasal drip for the past few days. She has a non-productive cough. The cough will cause her to gag at times. Denies any SOB or chest pain. She did state that her PCP told her that she had fluid behind both of her ears.   She wishes to [facility].   VS, please since RB is not in the office today. Thanks!

## 2018-02-16 ENCOUNTER — Ambulatory Visit (INDEPENDENT_AMBULATORY_CARE_PROVIDER_SITE_OTHER): Payer: Federal, State, Local not specified - PPO | Admitting: Pulmonary Disease

## 2018-02-16 ENCOUNTER — Encounter: Payer: Self-pay | Admitting: Pulmonary Disease

## 2018-02-16 VITALS — BP 134/80 | HR 100 | Ht 69.0 in | Wt 222.0 lb

## 2018-02-16 DIAGNOSIS — J45909 Unspecified asthma, uncomplicated: Secondary | ICD-10-CM | POA: Diagnosis not present

## 2018-02-16 DIAGNOSIS — J301 Allergic rhinitis due to pollen: Secondary | ICD-10-CM | POA: Diagnosis not present

## 2018-02-16 LAB — NITRIC OXIDE: NITRIC OXIDE: 18

## 2018-02-16 MED ORDER — BUDESONIDE-FORMOTEROL FUMARATE 160-4.5 MCG/ACT IN AERO: 2.0000 | INHALATION_SPRAY | Freq: Two times a day (BID) | RESPIRATORY_TRACT | 0 refills | Status: DC

## 2018-02-16 MED ORDER — BUDESONIDE-FORMOTEROL FUMARATE 160-4.5 MCG/ACT IN AERO: 2.0000 | INHALATION_SPRAY | Freq: Two times a day (BID) | RESPIRATORY_TRACT | 5 refills | Status: DC

## 2018-02-16 NOTE — Patient Instructions (Signed)
Allergic rhinitis with worsening symptoms: Add generic cetirizine 10 mg daily Continue Singulair Continue Flonase Continue saline rinses, though I would strongly recommend use distilled water rather than tap water  Moderate persistent asthma with worsening symptoms: Stop Qvar Take Symbicort 160 2 puffs twice a day no matter how you feel with a spacer Continue using albuterol as needed for chest tightness wheezing or shortness of breath  Return to clinic in 6-8 weeks, hopefully at that point your symptoms will have improved and we can consider backing off on therapy.

## 2018-02-16 NOTE — Progress Notes (Signed)
Subjective:   PATIENT ID: Ashley [name] GENDER: female DOB: 10/17/1956, MRN: [id number]  Synopsis: Ashley [name] patient with GERD and asthma  HPI  Chief Complaint  Patient presents with  . Acute Visit    RB pt being treated for asthma c/o PND, nonprod cough worse when laying down, eating X3 weeks.  Pt also notes she had the flu in February.      Ashley [name] says that she feels like she "is having an asthma attack" > she says taht she chokes on water and she feels like something is stuck in her throat > this will make her choke and cough until she feels like she needs to vomit > has been more of about three weeks ago > using albuterol 2-3 times a day > she is coughing up a lot of white mucus from her throat  She has a history of sinus infections and had sinus surgery back in 2002.  She doesn't feel like her symptoms are that bad right now.   She has more dyspnea every night > she says that the dyspnea occurs when she doesn't have trouble with the water > Sleeping on the left side it is better > she says the dyspnea is coming from mucus draining from her sinuses  Sinus congestion > she is taking "a nasal spray"> flonase > not taking any antihistamine  Past Medical History:  Diagnosis Date  . Arthritis   . Fibromyalgia 09/04/2014  . Headache   . Hypertension   . Lumbar radicular pain 09/04/2014     Family History  Problem Relation Age of Onset  . Aneurysm Father   . Bipolar disorder Brother   . Diabetes Brother   . Migraines Mother   . Eczema Sister   . Urticaria Sister   . Migraines Daughter   . Urticaria Daughter   . Asthma Daughter   . Cancer Other   . Hypertension Other   . Diabetes Other   . Allergic rhinitis Neg Hx   . Angioedema Neg Hx   . Immunodeficiency Neg Hx      Social History   Socioeconomic History  . Marital status: Married    Spouse name: Not on file  . Number of children: Not on file  . Years of education: Not on file  . Highest  education level: Not on file  Occupational History  . Not on file  Social Needs  . Financial resource strain: Not on file  . Food insecurity:    Worry: Not on file    Inability: Not on file  . Transportation needs:    Medical: Not on file    Non-medical: Not on file  Tobacco Use  . Smoking status: Former Smoker    Packs/day: 0.75    Years: 24.00    Pack years: 18.00    Types: Cigarettes    Last attempt to quit: 11/03/1992    Years since quitting: 25.3  . Smokeless tobacco: Never Used  Substance and Sexual Activity  . Alcohol use: Yes    Alcohol/week: 0.6 oz    Types: 1 Glasses of wine per week    Comment: occasionally  . Drug use: No  . Sexual activity: Not on file  Lifestyle  . Physical activity:    Days per week: Not on file    Minutes per session: Not on file  . Stress: Not on file  Relationships  . Social connections:    Talks on phone: Not on file  Gets together: Not on file    Attends religious service: Not on file    Active member of club or organization: Not on file    Attends meetings of clubs or organizations: Not on file    Relationship status: Not on file  . Intimate partner violence:    Fear of current or ex partner: Not on file    Emotionally abused: Not on file    Physically abused: Not on file    Forced sexual activity: Not on file  Other Topics Concern  . Not on file  Social History Narrative  . Not on file     Allergies  Allergen Reactions  . Latex Itching, Swelling and Other (See Comments)    redness  . Sulfa Antibiotics Diarrhea and Nausea And Vomiting  . Aspirin Other (See Comments)    Can cause her to bleed out due to G6 PD  . Oxycodone Itching    "all over"  . Tramadol Other (See Comments)    Makes her feel "spaced out on street drugs"     Outpatient Medications Prior to Visit  Medication Sig Dispense Refill  . acetaminophen-codeine (TYLENOL #3) 300-30 MG tablet Take by mouth 2 (two) times daily as needed for moderate pain.      Marland Kitchen albuterol (PROVENTIL HFA;VENTOLIN HFA) 108 (90 Base) MCG/ACT inhaler Inhale 1-2 puffs into the lungs every 6 (six) hours as needed for wheezing or shortness of breath. 16 g 6  . albuterol (PROVENTIL) (2.5 MG/3ML) 0.083% nebulizer solution USE 1 VIAL VIA NEBULIZER EVERY 4 HOURS AS NEEDED FOR WHEEZING OR SHORTNESS OF BREATH 150 mL 2  . baclofen (LIORESAL) 20 MG tablet Take 1 tablet (20 mg total) by mouth 3 (three) times daily. 15 each 0  . beclomethasone (QVAR) 80 MCG/ACT inhaler Inhale 1 puff into the lungs 2 (two) times daily. 1 Inhaler 5  . desloratadine (CLARINEX) 5 MG tablet Take 5 mg by mouth daily.    . diclofenac sodium (VOLTAREN) 1 % GEL Apply 2 g topically 4 (four) times daily. 3 Tube 1  . DULoxetine (CYMBALTA) 60 MG capsule Take 60 mg by mouth at bedtime.    . fluticasone (FLONASE) 50 MCG/ACT nasal spray Place 2 sprays into both nostrils 2 (two) times daily. 48 g 3  . gabapentin (NEURONTIN) 800 MG tablet Take 1 tablet (800 mg total) by mouth 3 (three) times daily. 90 tablet 5  . hydrochlorothiazide (HYDRODIURIL) 25 MG tablet Take 25 mg by mouth daily.     . methocarbamol (ROBAXIN) 500 MG tablet Take 500 mg by mouth every 8 (eight) hours as needed for muscle spasms.    . montelukast (SINGULAIR) 10 MG tablet Take 1 tablet (10 mg total) by mouth at bedtime. 90 tablet 1  . naproxen sodium (ANAPROX) 550 MG tablet Take 1 tablet (550 mg total) by mouth 2 (two) times daily with a meal. 60 tablet 1  . nortriptyline (PAMELOR) 25 MG capsule Take 1 capsule (25 mg total) by mouth at bedtime. 30 capsule 0  . ondansetron (ZOFRAN) 4 MG tablet Take 1 tablet (4 mg total) by mouth every 8 (eight) hours as needed for nausea or vomiting. 20 tablet 1  . pantoprazole (PROTONIX) 40 MG tablet Take 40 mg by mouth daily.     No facility-administered medications prior to visit.     Review of Systems  Constitutional: Negative for diaphoresis, fever and malaise/fatigue.  HENT: Positive for congestion. Negative  for hearing loss and sinus pain.  Respiratory: Positive for cough, sputum production, shortness of breath and wheezing.   Cardiovascular: Negative for chest pain and leg swelling.  Neurological: Seizures:  .dbmfu.      Objective:  Physical Exam   Vitals:   02/16/18 0918  BP: 134/80  Pulse: 100  SpO2: 100%  Weight: 222 lb (100.7 kg)  Height: 5\' 9"  (1.753 m)    Gen: well appearing HENT: OP clear, TM's clear, neck supple PULM: CTA B, normal percussion CV: RRR, no mgr, trace edema GI: BS+, soft, nontender Derm: no cyanosis or rash Psyche: normal mood and affect    CBC No results found for: WBC, RBC, HGB, HCT, PLT, MCV, MCH, MCHC, RDW, LYMPHSABS, MONOABS, EOSABS, BASOSABS   Chest imaging:  PFT:  Exhaled Nitric Oxide: 02/16/2017 18 ppm  Labs:  Path:  Echo:  Heart Catheterization:   Records from her last visit with [place] reviewed where she was advised to start using flonase twice a day for allergic rhinitis    Assessment & Plan:   No diagnosis found.  Discussion: Emanuela comes today with symptoms primarily of allergic rhinitis which is poorly controlled.  She has been using Ashley [name] Med rinses at home with tap water so I told her to stop doing that.  She needs to start using an antihistamine to help with the postnasal drip from poorly controlled allergic rhinitis.  Because she has been having increasing chest tightness and shortness of breath she may be having less well controlled asthma.  We will check an exhaled nitric oxide test today and will have her step up therapy from Qvar to Symbicort for the next few months.  Plan: Allergic rhinitis with worsening symptoms: Add generic cetirizine 10 mg daily Continue Singulair Continue Flonase Continue saline rinses, though I would strongly recommend use distilled water rather than tap water  Moderate persistent asthma with worsening symptoms: Stop Qvar Take Symbicort 160 2 puffs twice a day no matter how you feel  with a spacer Continue using albuterol as needed for chest tightness wheezing or shortness of breath  Return to clinic in 6-8 weeks, hopefully at that point your symptoms will have improved and we can consider backing off on therapy.    Current Outpatient Medications:  .  acetaminophen-codeine (TYLENOL #3) 300-30 MG tablet, Take by mouth 2 (two) times daily as needed for moderate pain., Disp: , Rfl:  .  albuterol (PROVENTIL HFA;VENTOLIN HFA) 108 (90 Base) MCG/ACT inhaler, Inhale 1-2 puffs into the lungs every 6 (six) hours as needed for wheezing or shortness of breath., Disp: 16 g, Rfl: 6 .  albuterol (PROVENTIL) (2.5 MG/3ML) 0.083% nebulizer solution, USE 1 VIAL VIA NEBULIZER EVERY 4 HOURS AS NEEDED FOR WHEEZING OR SHORTNESS OF BREATH, Disp: 150 mL, Rfl: 2 .  baclofen (LIORESAL) 20 MG tablet, Take 1 tablet (20 mg total) by mouth 3 (three) times daily., Disp: 15 each, Rfl: 0 .  beclomethasone (QVAR) 80 MCG/ACT inhaler, Inhale 1 puff into the lungs 2 (two) times daily., Disp: 1 Inhaler, Rfl: 5 .  desloratadine (CLARINEX) 5 MG tablet, Take 5 mg by mouth daily., Disp: , Rfl:  .  diclofenac sodium (VOLTAREN) 1 % GEL, Apply 2 g topically 4 (four) times daily., Disp: 3 Tube, Rfl: 1 .  DULoxetine (CYMBALTA) 60 MG capsule, Take 60 mg by mouth at bedtime., Disp: , Rfl:  .  fluticasone (FLONASE) 50 MCG/ACT nasal spray, Place 2 sprays into both nostrils 2 (two) times daily., Disp: 48 g, Rfl: 3 .  gabapentin (NEURONTIN)  800 MG tablet, Take 1 tablet (800 mg total) by mouth 3 (three) times daily., Disp: 90 tablet, Rfl: 5 .  hydrochlorothiazide (HYDRODIURIL) 25 MG tablet, Take 25 mg by mouth daily. , Disp: , Rfl:  .  methocarbamol (ROBAXIN) 500 MG tablet, Take 500 mg by mouth every 8 (eight) hours as needed for muscle spasms., Disp: , Rfl:  .  montelukast (SINGULAIR) 10 MG tablet, Take 1 tablet (10 mg total) by mouth at bedtime., Disp: 90 tablet, Rfl: 1 .  naproxen sodium (ANAPROX) 550 MG tablet, Take 1 tablet  (550 mg total) by mouth 2 (two) times daily with a meal., Disp: 60 tablet, Rfl: 1 .  nortriptyline (PAMELOR) 25 MG capsule, Take 1 capsule (25 mg total) by mouth at bedtime., Disp: 30 capsule, Rfl: 0 .  ondansetron (ZOFRAN) 4 MG tablet, Take 1 tablet (4 mg total) by mouth every 8 (eight) hours as needed for nausea or vomiting., Disp: 20 tablet, Rfl: 1 .  pantoprazole (PROTONIX) 40 MG tablet, Take 40 mg by mouth daily., Disp: , Rfl:

## 2018-02-25 DIAGNOSIS — M797 Fibromyalgia: Secondary | ICD-10-CM | POA: Diagnosis not present

## 2018-02-25 DIAGNOSIS — M545 Low back pain: Secondary | ICD-10-CM | POA: Diagnosis not present

## 2018-03-04 DIAGNOSIS — K219 Gastro-esophageal reflux disease without esophagitis: Secondary | ICD-10-CM | POA: Diagnosis not present

## 2018-03-04 DIAGNOSIS — R490 Dysphonia: Secondary | ICD-10-CM | POA: Diagnosis not present

## 2018-03-04 DIAGNOSIS — J029 Acute pharyngitis, unspecified: Secondary | ICD-10-CM | POA: Diagnosis not present

## 2018-03-04 DIAGNOSIS — M545 Low back pain: Secondary | ICD-10-CM | POA: Diagnosis not present

## 2018-03-04 DIAGNOSIS — M797 Fibromyalgia: Secondary | ICD-10-CM | POA: Diagnosis not present

## 2018-03-05 DIAGNOSIS — M545 Low back pain: Secondary | ICD-10-CM | POA: Diagnosis not present

## 2018-03-05 DIAGNOSIS — M797 Fibromyalgia: Secondary | ICD-10-CM | POA: Diagnosis not present

## 2018-03-05 DIAGNOSIS — I10 Essential (primary) hypertension: Secondary | ICD-10-CM | POA: Diagnosis not present

## 2018-03-08 ENCOUNTER — Other Ambulatory Visit: Payer: Self-pay | Admitting: Physical Medicine & Rehabilitation

## 2018-03-10 DIAGNOSIS — M65331 Trigger finger, right middle finger: Secondary | ICD-10-CM | POA: Diagnosis not present

## 2018-03-12 DIAGNOSIS — M797 Fibromyalgia: Secondary | ICD-10-CM | POA: Diagnosis not present

## 2018-03-12 DIAGNOSIS — H1132 Conjunctival hemorrhage, left eye: Secondary | ICD-10-CM | POA: Diagnosis not present

## 2018-03-12 DIAGNOSIS — M545 Low back pain: Secondary | ICD-10-CM | POA: Diagnosis not present

## 2018-03-13 ENCOUNTER — Other Ambulatory Visit: Payer: Self-pay | Admitting: Physical Medicine & Rehabilitation

## 2018-03-17 DIAGNOSIS — Z713 Dietary counseling and surveillance: Secondary | ICD-10-CM | POA: Diagnosis not present

## 2018-03-24 DIAGNOSIS — M797 Fibromyalgia: Secondary | ICD-10-CM | POA: Diagnosis not present

## 2018-03-24 DIAGNOSIS — M545 Low back pain: Secondary | ICD-10-CM | POA: Diagnosis not present

## 2018-03-30 DIAGNOSIS — M797 Fibromyalgia: Secondary | ICD-10-CM | POA: Diagnosis not present

## 2018-03-30 DIAGNOSIS — M545 Low back pain: Secondary | ICD-10-CM | POA: Diagnosis not present

## 2018-04-02 DIAGNOSIS — I890 Lymphedema, not elsewhere classified: Secondary | ICD-10-CM | POA: Diagnosis not present

## 2018-04-02 DIAGNOSIS — I10 Essential (primary) hypertension: Secondary | ICD-10-CM | POA: Diagnosis not present

## 2018-04-08 DIAGNOSIS — M797 Fibromyalgia: Secondary | ICD-10-CM | POA: Diagnosis not present

## 2018-04-08 DIAGNOSIS — M545 Low back pain: Secondary | ICD-10-CM | POA: Diagnosis not present

## 2018-04-13 ENCOUNTER — Encounter: Payer: Self-pay | Admitting: Emergency Medicine

## 2018-04-13 ENCOUNTER — Ambulatory Visit (INDEPENDENT_AMBULATORY_CARE_PROVIDER_SITE_OTHER): Payer: Federal, State, Local not specified - PPO | Admitting: Emergency Medicine

## 2018-04-13 VITALS — BP 120/88 | HR 88 | Ht 69.0 in | Wt 222.6 lb

## 2018-04-13 DIAGNOSIS — J4520 Mild intermittent asthma, uncomplicated: Secondary | ICD-10-CM

## 2018-04-13 MED ORDER — BECLOMETHASONE DIPROP HFA 80 MCG/ACT IN AERB: 2.0000 | INHALATION_SPRAY | Freq: Two times a day (BID) | RESPIRATORY_TRACT | 6 refills | Status: DC

## 2018-04-13 MED ORDER — FLUTICASONE PROPIONATE 50 MCG/ACT NA SUSP: 2.0000 | Freq: Every day | NASAL | 6 refills | Status: DC

## 2018-04-13 NOTE — Assessment & Plan Note (Signed)
Better control since we got her rhinitis an nasal drainage better treated.  The addition of cetirizine helped.  It turns out in retrospect that she had been started on lisinopril in early April which likely contributed to her difficulties as well.  It was stopped in early May.  She is been doing better ever since.  She is not sure that there was a big difference in her breathing while she was on the Symbicort.  I will change her back to Qvar now that her rhinitis is improved.  Discussed immunizations with her.  She will get the flu shot in the fall.  She has had both the Pneumovax and the Prevnar 13.  We need to confirm the date of the Prevnar with Dr. Recardo Evangelisthacker's office.

## 2018-04-13 NOTE — Assessment & Plan Note (Signed)
-   Continue Protonix twice daily 

## 2018-04-13 NOTE — Progress Notes (Signed)
h Subjective:    Patient ID: Ashley [name], female    DOB: 05-22-1956, 62 y.o.   MRN: [id number]  Asthma  She complains of cough and shortness of breath. There is no wheezing. Associated symptoms include headaches. Pertinent negatives include no ear pain, fever, postnasal drip, rhinorrhea, sneezing, sore throat or trouble swallowing. Her past medical history is significant for asthma.   62 yo woman, former smoker (20 pk-yrs), hx HTN, Allergies, GERD. Has been given dx of asthma associated with her GERD.           ROV 04/13/18 --patient is a 62 year old woman with former tobacco and asthma.  Her asthma is impacted by both GERD and allergic rhinitis.  She was seen in April by Dr. Kendrick Fries at which time she had a significant increase in her allergic rhinitis symptoms that seem to result in some increased dyspnea and asthma symptoms as well.  She had cetirizine added to her Singulair and Flonase.  It was also discovered that she was using tap water for her nasal rinses and it was recommended that she change to distilled water.  Her Qvar was changed to Symbicort to manage the increased asthma symptoms.  She returns today for follow-up. She also notes that she had been started on lisinopril in April, then her PCP took her off lisinopril in early May, which has also helped. She tells me that she got the Prevnar-13 at Dr Recardo Evangelist office last Fall.                                                                                                                                   Review of Systems  Constitutional: Negative for fever and unexpected weight change.  HENT: Negative for congestion, dental problem, ear pain, nosebleeds, postnasal drip, rhinorrhea, sinus pressure, sneezing, sore throat and trouble swallowing.   Eyes: Negative for redness and itching.  Respiratory: Positive for cough and shortness of breath. Negative for choking, chest tightness and wheezing.   Cardiovascular: Negative for  palpitations and leg swelling.  Gastrointestinal: Negative for nausea and vomiting.  Genitourinary: Negative for dysuria.  Musculoskeletal: Negative for joint swelling.  Skin: Negative for rash.  Neurological: Positive for headaches.  Hematological: Does not bruise/bleed easily.  Psychiatric/Behavioral: Negative for dysphoric mood. The patient is not nervous/anxious.         Objective:   Physical Exam Vitals:   04/13/18 1617 04/13/18 1618  BP:  120/88  Pulse:  88  SpO2:  95%  Weight: 222 lb 9.6 oz (101 kg)   Height: 5\' 9"  (1.753 m)    Gen: Pleasant, well-nourished, in no distress,  normal affect  ENT: No lesions,  mouth clear,  oropharynx clear, no postnasal drip  Neck: No JVD, no stridor  Lungs: No use of accessory muscles,  clear without rales or rhonchi  Cardiovascular: RRR, heart sounds normal, no murmur or gallops, no peripheral edema  Musculoskeletal: No deformities, no  cyanosis or clubbing  Neuro: alert, non focal  Skin: Warm, no lesions or rashes      Assessment & Plan:  Intrinsic asthma Better control since we got her rhinitis an nasal drainage better treated.  The addition of cetirizine helped.  It turns out in retrospect that she had been started on lisinopril in early April which likely contributed to her difficulties as well.  It was stopped in early May.  She is been doing better ever since.  She is not sure that there was a big difference in her breathing while she was on the Symbicort.  I will change her back to Qvar now that her rhinitis is improved.  Discussed immunizations with her.  She will get the flu shot in the fall.  She has had both the Pneumovax and the Prevnar 13.  We need to confirm the date of the Prevnar with Dr. Recardo Evangelisthacker's office.  Allergic rhinitis Continue cetirizine, Singulair.  We need to refill her Flonase.  She is now doing nasal saline washes with distilled water instead of tap water.  GERD (gastroesophageal reflux  disease) Continue Protonix twice daily  Levy Pupaobert Cheresa Siers, MD, PhD 04/13/2018, 4:51 PM [facility] [phone] or if no answer [phone]

## 2018-04-13 NOTE — Patient Instructions (Addendum)
Please continue your Singulair and cetrizine as you have been taking them   We will restart your flonase 2 sprays each side once a day Continue your nasal rinses (with distilled water).  Continue your protonix twice a day Stop Symbicort We will restart Qvar 80mcg,  2 puffs twice a day. Rinse and gargle after using.  Get the flu shot in the Fall Follow with Ashley [name] in 6 months or sooner if you have any problems

## 2018-04-13 NOTE — Assessment & Plan Note (Signed)
Continue cetirizine, Singulair.  We need to refill her Flonase.  She is now doing nasal saline washes with distilled water instead of tap water.

## 2018-04-16 DIAGNOSIS — M65331 Trigger finger, right middle finger: Secondary | ICD-10-CM | POA: Diagnosis not present

## 2018-04-19 ENCOUNTER — Other Ambulatory Visit: Payer: Self-pay | Admitting: Emergency Medicine

## 2018-04-23 DIAGNOSIS — E669 Obesity, unspecified: Secondary | ICD-10-CM | POA: Diagnosis not present

## 2018-04-23 DIAGNOSIS — F4323 Adjustment disorder with mixed anxiety and depressed mood: Secondary | ICD-10-CM | POA: Diagnosis not present

## 2018-04-23 DIAGNOSIS — I10 Essential (primary) hypertension: Secondary | ICD-10-CM | POA: Diagnosis not present

## 2018-04-26 DIAGNOSIS — M545 Low back pain: Secondary | ICD-10-CM | POA: Diagnosis not present

## 2018-04-28 DIAGNOSIS — Z713 Dietary counseling and surveillance: Secondary | ICD-10-CM | POA: Diagnosis not present

## 2018-05-12 DIAGNOSIS — J45909 Unspecified asthma, uncomplicated: Secondary | ICD-10-CM | POA: Diagnosis not present

## 2018-05-12 DIAGNOSIS — J302 Other seasonal allergic rhinitis: Secondary | ICD-10-CM | POA: Diagnosis not present

## 2018-05-12 DIAGNOSIS — I10 Essential (primary) hypertension: Secondary | ICD-10-CM | POA: Diagnosis not present

## 2018-05-12 DIAGNOSIS — E669 Obesity, unspecified: Secondary | ICD-10-CM | POA: Diagnosis not present

## 2018-05-25 ENCOUNTER — Other Ambulatory Visit: Payer: Self-pay | Admitting: Orthopedic Surgery

## 2018-05-25 DIAGNOSIS — M545 Low back pain: Principal | ICD-10-CM

## 2018-05-25 DIAGNOSIS — G8929 Other chronic pain: Secondary | ICD-10-CM

## 2018-06-04 ENCOUNTER — Ambulatory Visit
Admission: RE | Admit: 2018-06-04 | Discharge: 2018-06-04 | Disposition: A | Discharge status: Home / Self Care | Payer: Worker's Compensation | Source: Ambulatory Visit | Attending: Orthopedic Surgery | Admitting: Orthopedic Surgery

## 2018-06-04 DIAGNOSIS — G8929 Other chronic pain: Secondary | ICD-10-CM

## 2018-06-04 DIAGNOSIS — M545 Low back pain: Principal | ICD-10-CM

## 2018-06-08 ENCOUNTER — Ambulatory Visit
Admission: RE | Admit: 2018-06-08 | Discharge: 2018-06-08 | Disposition: A | Discharge status: Home / Self Care | Payer: Worker's Compensation | Source: Ambulatory Visit | Attending: Orthopedic Surgery | Admitting: Orthopedic Surgery

## 2018-06-08 DIAGNOSIS — G8929 Other chronic pain: Secondary | ICD-10-CM

## 2018-06-08 DIAGNOSIS — M545 Low back pain: Principal | ICD-10-CM

## 2018-06-09 DIAGNOSIS — M1812 Unilateral primary osteoarthritis of first carpometacarpal joint, left hand: Secondary | ICD-10-CM | POA: Diagnosis not present

## 2018-06-09 DIAGNOSIS — M65331 Trigger finger, right middle finger: Secondary | ICD-10-CM | POA: Diagnosis not present

## 2018-06-30 DIAGNOSIS — M65331 Trigger finger, right middle finger: Secondary | ICD-10-CM | POA: Diagnosis not present

## 2018-06-30 DIAGNOSIS — M1812 Unilateral primary osteoarthritis of first carpometacarpal joint, left hand: Secondary | ICD-10-CM | POA: Diagnosis not present

## 2018-07-06 DIAGNOSIS — K219 Gastro-esophageal reflux disease without esophagitis: Secondary | ICD-10-CM | POA: Diagnosis not present

## 2018-07-09 DIAGNOSIS — M25541 Pain in joints of right hand: Secondary | ICD-10-CM | POA: Diagnosis not present

## 2018-07-09 DIAGNOSIS — M25641 Stiffness of right hand, not elsewhere classified: Secondary | ICD-10-CM | POA: Diagnosis not present

## 2018-07-09 DIAGNOSIS — M6281 Muscle weakness (generalized): Secondary | ICD-10-CM | POA: Diagnosis not present

## 2018-07-09 DIAGNOSIS — M65331 Trigger finger, right middle finger: Secondary | ICD-10-CM | POA: Diagnosis not present

## 2018-07-11 DIAGNOSIS — S52591A Other fractures of lower end of right radius, initial encounter for closed fracture: Secondary | ICD-10-CM | POA: Diagnosis not present

## 2018-07-14 DIAGNOSIS — S63501A Unspecified sprain of right wrist, initial encounter: Secondary | ICD-10-CM | POA: Diagnosis not present

## 2018-07-20 DIAGNOSIS — M65331 Trigger finger, right middle finger: Secondary | ICD-10-CM | POA: Diagnosis not present

## 2018-07-20 DIAGNOSIS — M25541 Pain in joints of right hand: Secondary | ICD-10-CM | POA: Diagnosis not present

## 2018-07-20 DIAGNOSIS — M6281 Muscle weakness (generalized): Secondary | ICD-10-CM | POA: Diagnosis not present

## 2018-07-20 DIAGNOSIS — M25641 Stiffness of right hand, not elsewhere classified: Secondary | ICD-10-CM | POA: Diagnosis not present

## 2018-07-23 DIAGNOSIS — M65331 Trigger finger, right middle finger: Secondary | ICD-10-CM | POA: Diagnosis not present

## 2018-07-23 DIAGNOSIS — M6281 Muscle weakness (generalized): Secondary | ICD-10-CM | POA: Diagnosis not present

## 2018-07-23 DIAGNOSIS — M25641 Stiffness of right hand, not elsewhere classified: Secondary | ICD-10-CM | POA: Diagnosis not present

## 2018-07-23 DIAGNOSIS — M25541 Pain in joints of right hand: Secondary | ICD-10-CM | POA: Diagnosis not present

## 2018-07-27 DIAGNOSIS — M6281 Muscle weakness (generalized): Secondary | ICD-10-CM | POA: Diagnosis not present

## 2018-07-27 DIAGNOSIS — M25641 Stiffness of right hand, not elsewhere classified: Secondary | ICD-10-CM | POA: Diagnosis not present

## 2018-07-27 DIAGNOSIS — M65331 Trigger finger, right middle finger: Secondary | ICD-10-CM | POA: Diagnosis not present

## 2018-07-27 DIAGNOSIS — M25541 Pain in joints of right hand: Secondary | ICD-10-CM | POA: Diagnosis not present

## 2018-07-30 DIAGNOSIS — M6281 Muscle weakness (generalized): Secondary | ICD-10-CM | POA: Diagnosis not present

## 2018-07-30 DIAGNOSIS — M25641 Stiffness of right hand, not elsewhere classified: Secondary | ICD-10-CM | POA: Diagnosis not present

## 2018-07-30 DIAGNOSIS — M65331 Trigger finger, right middle finger: Secondary | ICD-10-CM | POA: Diagnosis not present

## 2018-07-30 DIAGNOSIS — M25541 Pain in joints of right hand: Secondary | ICD-10-CM | POA: Diagnosis not present

## 2018-08-03 ENCOUNTER — Telehealth: Payer: Self-pay | Admitting: Emergency Medicine

## 2018-08-03 ENCOUNTER — Other Ambulatory Visit: Payer: Self-pay | Admitting: Orthopedic Surgery

## 2018-08-03 DIAGNOSIS — M65331 Trigger finger, right middle finger: Secondary | ICD-10-CM | POA: Diagnosis not present

## 2018-08-03 DIAGNOSIS — S63501A Unspecified sprain of right wrist, initial encounter: Secondary | ICD-10-CM | POA: Diagnosis not present

## 2018-08-03 MED ORDER — BECLOMETHASONE DIPROP HFA 80 MCG/ACT IN AERB: 2.0000 | INHALATION_SPRAY | Freq: Two times a day (BID) | RESPIRATORY_TRACT | 6 refills | Status: DC

## 2018-08-03 NOTE — Telephone Encounter (Signed)
Called and spoke with patient, advised her that we have sent prescription in. Nothing further needed.

## 2018-08-04 ENCOUNTER — Encounter (HOSPITAL_COMMUNITY): Payer: Self-pay | Admitting: *Deleted

## 2018-08-04 ENCOUNTER — Other Ambulatory Visit: Payer: Self-pay

## 2018-08-04 NOTE — Progress Notes (Signed)
Pt denies SOB, chest pain, and being under the care of a cardiologist. Pt denies having a cardiac cath. Pt denies having an EKG and chest x ray within the last year. Pt denies recent labs. Pt made aware to stop taking vitamins, fish oil, Turmeric and herbal medications. Do not take any NSAIDs ie: Ibuprofen, Advil, Naproxen (Naprosyn/Aleve), Voltaren, Motrin, BC and Goody Powder. Pt verbalized understanding of all pre-op instructions.

## 2018-08-04 NOTE — Progress Notes (Signed)
   08/04/18 1511  OBSTRUCTIVE SLEEP APNEA  Have you ever been diagnosed with sleep apnea through a sleep study? No  Do you snore loudly (loud enough to be heard through closed doors)?  1  Do you often feel tired, fatigued, or sleepy during the daytime (such as falling asleep during driving or talking to someone)? 1  Has anyone observed you stop breathing during your sleep? 0  Do you have, or are you being treated for high blood pressure? 1  BMI more than 35 kg/m2? 0  Age > 50 (1-yes) 1  Neck circumference greater than:Female 16 inches or larger, Female 17inches or larger?  (assess dos)  Female Gender (Yes=1) 0  Obstructive Sleep Apnea Score 4

## 2018-08-05 ENCOUNTER — Ambulatory Visit (HOSPITAL_COMMUNITY): Admitting: Certified Registered Nurse Anesthetist

## 2018-08-05 ENCOUNTER — Encounter (HOSPITAL_COMMUNITY): Payer: Self-pay | Admitting: Surgery

## 2018-08-05 ENCOUNTER — Ambulatory Visit (HOSPITAL_COMMUNITY)

## 2018-08-05 ENCOUNTER — Ambulatory Visit (HOSPITAL_COMMUNITY)
Admission: RE | Admit: 2018-08-05 | Discharge: 2018-08-06 | Disposition: A | Discharge status: Home / Self Care | Source: Ambulatory Visit | Attending: Orthopedic Surgery | Admitting: Orthopedic Surgery

## 2018-08-05 ENCOUNTER — Encounter (HOSPITAL_COMMUNITY)
Admission: RE | Disposition: A | Discharge status: Home / Self Care | Payer: Self-pay | Source: Ambulatory Visit | Attending: Orthopedic Surgery

## 2018-08-05 DIAGNOSIS — M960 Pseudarthrosis after fusion or arthrodesis: Secondary | ICD-10-CM | POA: Diagnosis present

## 2018-08-05 DIAGNOSIS — E669 Obesity, unspecified: Secondary | ICD-10-CM | POA: Insufficient documentation

## 2018-08-05 DIAGNOSIS — Y838 Other surgical procedures as the cause of abnormal reaction of the patient, or of later complication, without mention of misadventure at the time of the procedure: Secondary | ICD-10-CM | POA: Diagnosis not present

## 2018-08-05 DIAGNOSIS — M533 Sacrococcygeal disorders, not elsewhere classified: Secondary | ICD-10-CM | POA: Diagnosis present

## 2018-08-05 DIAGNOSIS — Z79891 Long term (current) use of opiate analgesic: Secondary | ICD-10-CM | POA: Insufficient documentation

## 2018-08-05 DIAGNOSIS — Z6831 Body mass index (BMI) 31.0-31.9, adult: Secondary | ICD-10-CM | POA: Diagnosis not present

## 2018-08-05 DIAGNOSIS — Z79899 Other long term (current) drug therapy: Secondary | ICD-10-CM | POA: Insufficient documentation

## 2018-08-05 DIAGNOSIS — T8489XA Other specified complication of internal orthopedic prosthetic devices, implants and grafts, initial encounter: Secondary | ICD-10-CM | POA: Diagnosis not present

## 2018-08-05 DIAGNOSIS — F329 Major depressive disorder, single episode, unspecified: Secondary | ICD-10-CM | POA: Insufficient documentation

## 2018-08-05 DIAGNOSIS — Z8673 Personal history of transient ischemic attack (TIA), and cerebral infarction without residual deficits: Secondary | ICD-10-CM | POA: Diagnosis not present

## 2018-08-05 DIAGNOSIS — K219 Gastro-esophageal reflux disease without esophagitis: Secondary | ICD-10-CM | POA: Diagnosis not present

## 2018-08-05 DIAGNOSIS — Z9889 Other specified postprocedural states: Secondary | ICD-10-CM | POA: Diagnosis not present

## 2018-08-05 DIAGNOSIS — I10 Essential (primary) hypertension: Secondary | ICD-10-CM | POA: Insufficient documentation

## 2018-08-05 DIAGNOSIS — M797 Fibromyalgia: Secondary | ICD-10-CM | POA: Diagnosis not present

## 2018-08-05 DIAGNOSIS — Z87891 Personal history of nicotine dependence: Secondary | ICD-10-CM | POA: Diagnosis not present

## 2018-08-05 DIAGNOSIS — Z7951 Long term (current) use of inhaled steroids: Secondary | ICD-10-CM | POA: Diagnosis not present

## 2018-08-05 HISTORY — DX: Presence of spectacles and contact lenses: Z97.3

## 2018-08-05 HISTORY — DX: Depression, unspecified: F32.A

## 2018-08-05 HISTORY — PX: SACROILIAC JOINT FUSION: SHX6088

## 2018-08-05 HISTORY — DX: Gastro-esophageal reflux disease without esophagitis: K21.9

## 2018-08-05 HISTORY — DX: Other reaction to spinal and lumbar puncture: G97.1

## 2018-08-05 HISTORY — DX: Major depressive disorder, single episode, unspecified: F32.9

## 2018-08-05 HISTORY — DX: Transient cerebral ischemic attack, unspecified: G45.9

## 2018-08-05 HISTORY — DX: Prediabetes: R73.03

## 2018-08-05 HISTORY — DX: Sacrococcygeal disorders, not elsewhere classified: M53.3

## 2018-08-05 LAB — TYPE AND SCREEN
ABO/RH(D): O POS
Antibody Screen: NEGATIVE

## 2018-08-05 LAB — COMPREHENSIVE METABOLIC PANEL
ALBUMIN: 3.9 g/dL (ref 3.5–5.0)
ALK PHOS: 86 U/L (ref 38–126)
ALT: 20 U/L (ref 0–44)
ANION GAP: 12 (ref 5–15)
AST: 22 U/L (ref 15–41)
BUN: 9 mg/dL (ref 6–20)
CALCIUM: 9.5 mg/dL (ref 8.9–10.3)
CHLORIDE: 103 mmol/L (ref 98–111)
CO2: 26 mmol/L (ref 22–32)
CREATININE: 0.9 mg/dL (ref 0.44–1.00)
GFR calc non Af Amer: >60 mL/min (ref >60)
GLUCOSE: 109 mg/dL — AB (ref 70–99)
Potassium: 4 mmol/L (ref 3.5–5.1)
Sodium: 141 mmol/L (ref 135–145)
Total Bilirubin: 0.6 mg/dL (ref 0.3–1.2)
Total Protein: 6.6 g/dL (ref 6.5–8.1)

## 2018-08-05 LAB — CBC WITH DIFFERENTIAL/PLATELET
ABS IMMATURE GRANULOCYTES: 0 10*3/uL (ref 0.0–0.1)
BASOS ABS: 0 10*3/uL (ref 0.0–0.1)
BASOS PCT: 1 %
EOS PCT: 5 %
Eosinophils Absolute: 0.2 10*3/uL (ref 0.0–0.7)
HCT: 40.5 % (ref 36.0–46.0)
Hemoglobin: 12.7 g/dL (ref 12.0–15.0)
Immature Granulocytes: 0 %
Lymphocytes Relative: 39 %
Lymphs Abs: 1.6 10*3/uL (ref 0.7–4.0)
MCH: 27.7 pg (ref 26.0–34.0)
MCHC: 31.4 g/dL (ref 30.0–36.0)
MCV: 88.2 fL (ref 78.0–100.0)
MONO ABS: 0.4 10*3/uL (ref 0.1–1.0)
Monocytes Relative: 10 %
NEUTROS ABS: 1.8 10*3/uL (ref 1.7–7.7)
Neutrophils Relative %: 45 %
PLATELETS: 212 10*3/uL (ref 150–400)
RBC: 4.59 MIL/uL (ref 3.87–5.11)
RDW: 13.7 % (ref 11.5–15.5)
WBC: 4 10*3/uL (ref 4.0–10.5)

## 2018-08-05 LAB — ABO/RH: ABO/RH(D): O POS

## 2018-08-05 LAB — URINALYSIS, ROUTINE W REFLEX MICROSCOPIC
Bacteria, UA: NONE SEEN
Bilirubin Urine: NEGATIVE
Glucose, UA: NEGATIVE mg/dL
HGB URINE DIPSTICK: NEGATIVE
Ketones, ur: NEGATIVE mg/dL
NITRITE: NEGATIVE
PH: 6 (ref 5.0–8.0)
Protein, ur: NEGATIVE mg/dL
Specific Gravity, Urine: 1.02 (ref 1.005–1.030)

## 2018-08-05 LAB — PROTIME-INR
INR: 0.91
Prothrombin Time: 12.2 seconds (ref 11.4–15.2)

## 2018-08-05 LAB — GLUCOSE, CAPILLARY
GLUCOSE-CAPILLARY: 83 mg/dL (ref 70–99)
Glucose-Capillary: 114 mg/dL — ABNORMAL HIGH (ref 70–99)
Glucose-Capillary: 98 mg/dL (ref 70–99)

## 2018-08-05 LAB — APTT: APTT: 27 s (ref 24–36)

## 2018-08-05 SURGERY — SACROILIAC JOINT FUSION
Anesthesia: General | Site: Back | Laterality: Left

## 2018-08-05 MED ORDER — ONDANSETRON HCL 4 MG/2ML IJ SOLN: 4.0000 mg | Freq: Four times a day (QID) | INTRAMUSCULAR | Status: DC | PRN

## 2018-08-05 MED ORDER — METHOCARBAMOL 500 MG PO TABS
ORAL_TABLET | ORAL | Status: AC
  Filled 2018-08-05: qty 1

## 2018-08-05 MED ORDER — DIAZEPAM 5 MG PO TABS
5.0000 mg | ORAL_TABLET | Freq: Four times a day (QID) | ORAL | Status: DC | PRN
  Administered 2018-08-05 – 2018-08-06 (×2): 5 mg via ORAL
  Filled 2018-08-05 (×2): qty 1

## 2018-08-05 MED ORDER — DIPHENHYDRAMINE HCL 25 MG PO CAPS: 50.0000 mg | ORAL_CAPSULE | Freq: Four times a day (QID) | ORAL | Status: DC | PRN

## 2018-08-05 MED ORDER — PHENYLEPHRINE 40 MCG/ML (10ML) SYRINGE FOR IV PUSH (FOR BLOOD PRESSURE SUPPORT)
PREFILLED_SYRINGE | INTRAVENOUS | Status: DC | PRN
  Administered 2018-08-05: 80 ug via INTRAVENOUS
  Administered 2018-08-05: 120 ug via INTRAVENOUS
  Administered 2018-08-05: 40 ug via INTRAVENOUS
  Administered 2018-08-05: 120 ug via INTRAVENOUS

## 2018-08-05 MED ORDER — PHENOL 1.4 % MT LIQD: 1.0000 | OROMUCOSAL | Status: DC | PRN

## 2018-08-05 MED ORDER — FLUTICASONE PROPIONATE 50 MCG/ACT NA SUSP
2.0000 | Freq: Two times a day (BID) | NASAL | Status: DC
  Administered 2018-08-05: 2 via NASAL
  Filled 2018-08-05: qty 16

## 2018-08-05 MED ORDER — PANTOPRAZOLE SODIUM 40 MG PO TBEC: 40.0000 mg | DELAYED_RELEASE_TABLET | Freq: Every day | ORAL | Status: DC

## 2018-08-05 MED ORDER — OXYCODONE-ACETAMINOPHEN 5-325 MG PO TABS
1.0000 | ORAL_TABLET | ORAL | Status: DC | PRN
  Administered 2018-08-05 – 2018-08-06 (×5): 2 via ORAL
  Filled 2018-08-05 (×4): qty 2

## 2018-08-05 MED ORDER — DIPHENHYDRAMINE HCL 25 MG PO CAPS
50.0000 mg | ORAL_CAPSULE | ORAL | Status: DC | PRN
  Administered 2018-08-05 – 2018-08-06 (×4): 50 mg via ORAL
  Filled 2018-08-05 (×4): qty 2

## 2018-08-05 MED ORDER — ONDANSETRON HCL 4 MG PO TABS: 4.0000 mg | ORAL_TABLET | Freq: Three times a day (TID) | ORAL | Status: DC | PRN

## 2018-08-05 MED ORDER — FENTANYL CITRATE (PF) 100 MCG/2ML IJ SOLN
INTRAMUSCULAR | Status: AC
  Filled 2018-08-05: qty 2

## 2018-08-05 MED ORDER — PANTOPRAZOLE SODIUM 40 MG PO TBEC
40.0000 mg | DELAYED_RELEASE_TABLET | Freq: Two times a day (BID) | ORAL | Status: DC
  Administered 2018-08-05 – 2018-08-06 (×2): 40 mg via ORAL
  Filled 2018-08-05 (×2): qty 1

## 2018-08-05 MED ORDER — LORATADINE 10 MG PO TABS: 10.0000 mg | ORAL_TABLET | Freq: Every day | ORAL | Status: DC

## 2018-08-05 MED ORDER — ZOLPIDEM TARTRATE 5 MG PO TABS: 5.0000 mg | ORAL_TABLET | Freq: Every evening | ORAL | Status: DC | PRN

## 2018-08-05 MED ORDER — BUPIVACAINE-EPINEPHRINE (PF) 0.25% -1:200000 IJ SOLN
INTRAMUSCULAR | Status: DC | PRN
  Administered 2018-08-05: 30 mL

## 2018-08-05 MED ORDER — DIPHENHYDRAMINE HCL 50 MG/ML IJ SOLN: 12.5000 mg | Freq: Four times a day (QID) | INTRAMUSCULAR | Status: DC | PRN

## 2018-08-05 MED ORDER — DULOXETINE HCL 30 MG PO CPEP
60.0000 mg | ORAL_CAPSULE | Freq: Every day | ORAL | Status: DC
  Administered 2018-08-06: 60 mg via ORAL
  Filled 2018-08-05: qty 2

## 2018-08-05 MED ORDER — ALBUTEROL SULFATE HFA 108 (90 BASE) MCG/ACT IN AERS: 2.0000 | INHALATION_SPRAY | RESPIRATORY_TRACT | Status: DC | PRN

## 2018-08-05 MED ORDER — ROCURONIUM BROMIDE 10 MG/ML (PF) SYRINGE
PREFILLED_SYRINGE | INTRAVENOUS | Status: DC | PRN
  Administered 2018-08-05: 50 mg via INTRAVENOUS
  Administered 2018-08-05: 10 mg via INTRAVENOUS
  Administered 2018-08-05: 20 mg via INTRAVENOUS

## 2018-08-05 MED ORDER — DIPHENHYDRAMINE HCL 25 MG PO CAPS: 25.0000 mg | ORAL_CAPSULE | Freq: Four times a day (QID) | ORAL | Status: DC | PRN

## 2018-08-05 MED ORDER — HYDROCHLOROTHIAZIDE 25 MG PO TABS
12.5000 mg | ORAL_TABLET | Freq: Two times a day (BID) | ORAL | Status: DC
  Administered 2018-08-05 – 2018-08-06 (×2): 12.5 mg via ORAL
  Filled 2018-08-05 (×2): qty 1

## 2018-08-05 MED ORDER — ALUM & MAG HYDROXIDE-SIMETH 200-200-20 MG/5ML PO SUSP: 30.0000 mL | Freq: Four times a day (QID) | ORAL | Status: DC | PRN

## 2018-08-05 MED ORDER — GABAPENTIN 800 MG PO TABS
800.0000 mg | ORAL_TABLET | Freq: Three times a day (TID) | ORAL | Status: DC
Start: 2018-08-05 — End: 2018-08-05
  Filled 2018-08-05: qty 1

## 2018-08-05 MED ORDER — FLEET ENEMA 7-19 GM/118ML RE ENEM: 1.0000 | ENEMA | Freq: Once | RECTAL | Status: DC | PRN

## 2018-08-05 MED ORDER — MAGNESIUM CHLORIDE 64 MG PO TBEC
1.0000 | DELAYED_RELEASE_TABLET | Freq: Every day | ORAL | Status: DC
  Filled 2018-08-05: qty 1

## 2018-08-05 MED ORDER — 0.9 % SODIUM CHLORIDE (POUR BTL) OPTIME
TOPICAL | Status: DC | PRN
  Administered 2018-08-05: 1000 mL

## 2018-08-05 MED ORDER — BUPIVACAINE-EPINEPHRINE (PF) 0.25% -1:200000 IJ SOLN
INTRAMUSCULAR | Status: AC
  Filled 2018-08-05: qty 30

## 2018-08-05 MED ORDER — MORPHINE SULFATE (PF) 2 MG/ML IV SOLN: 1.0000 mg | INTRAVENOUS | Status: DC | PRN

## 2018-08-05 MED ORDER — LACTATED RINGERS IV SOLN
INTRAVENOUS | Status: DC | PRN
  Administered 2018-08-05: 13:00:00 via INTRAVENOUS

## 2018-08-05 MED ORDER — FENTANYL CITRATE (PF) 100 MCG/2ML IJ SOLN
25.0000 ug | INTRAMUSCULAR | Status: DC | PRN
  Administered 2018-08-05: 50 ug via INTRAVENOUS
  Administered 2018-08-05: 25 ug via INTRAVENOUS

## 2018-08-05 MED ORDER — NORTRIPTYLINE HCL 25 MG PO CAPS
25.0000 mg | ORAL_CAPSULE | Freq: Every day | ORAL | Status: DC
  Administered 2018-08-05: 25 mg via ORAL
  Filled 2018-08-05: qty 1

## 2018-08-05 MED ORDER — OXYCODONE-ACETAMINOPHEN 5-325 MG PO TABS
ORAL_TABLET | ORAL | Status: AC
  Filled 2018-08-05: qty 2

## 2018-08-05 MED ORDER — FENTANYL CITRATE (PF) 250 MCG/5ML IJ SOLN
INTRAMUSCULAR | Status: AC
  Filled 2018-08-05: qty 5

## 2018-08-05 MED ORDER — PAROXETINE MESYLATE 7.5 MG PO CAPS: 7.5000 mg | ORAL_CAPSULE | Freq: Every day | ORAL | Status: DC

## 2018-08-05 MED ORDER — CEFAZOLIN SODIUM-DEXTROSE 2-4 GM/100ML-% IV SOLN
2.0000 g | INTRAVENOUS | Status: AC
  Administered 2018-08-05: 2 g via INTRAVENOUS

## 2018-08-05 MED ORDER — SODIUM CHLORIDE 0.9 % IV SOLN
INTRAVENOUS | Status: DC | PRN
  Administered 2018-08-05: 25 ug/min via INTRAVENOUS

## 2018-08-05 MED ORDER — METHOCARBAMOL 500 MG PO TABS
500.0000 mg | ORAL_TABLET | Freq: Three times a day (TID) | ORAL | Status: DC | PRN
  Administered 2018-08-05: 500 mg via ORAL

## 2018-08-05 MED ORDER — DEXAMETHASONE SODIUM PHOSPHATE 10 MG/ML IJ SOLN
INTRAMUSCULAR | Status: DC | PRN
  Administered 2018-08-05: 10 mg via INTRAVENOUS

## 2018-08-05 MED ORDER — MENTHOL 3 MG MT LOZG: 1.0000 | LOZENGE | OROMUCOSAL | Status: DC | PRN

## 2018-08-05 MED ORDER — LIDOCAINE 2% (20 MG/ML) 5 ML SYRINGE
INTRAMUSCULAR | Status: AC
  Filled 2018-08-05: qty 5

## 2018-08-05 MED ORDER — SENNOSIDES-DOCUSATE SODIUM 8.6-50 MG PO TABS: 1.0000 | ORAL_TABLET | Freq: Every evening | ORAL | Status: DC | PRN

## 2018-08-05 MED ORDER — LIDOCAINE 2% (20 MG/ML) 5 ML SYRINGE
INTRAMUSCULAR | Status: DC | PRN
  Administered 2018-08-05: 100 mg via INTRAVENOUS

## 2018-08-05 MED ORDER — MIDAZOLAM HCL 2 MG/2ML IJ SOLN
INTRAMUSCULAR | Status: DC | PRN
  Administered 2018-08-05: 2 mg via INTRAVENOUS

## 2018-08-05 MED ORDER — SODIUM CHLORIDE 0.9% FLUSH
3.0000 mL | Freq: Two times a day (BID) | INTRAVENOUS | Status: DC
  Administered 2018-08-05: 3 mL via INTRAVENOUS

## 2018-08-05 MED ORDER — KETOTIFEN FUMARATE 0.025 % OP SOLN
1.0000 [drp] | Freq: Two times a day (BID) | OPHTHALMIC | Status: DC
  Administered 2018-08-05 – 2018-08-06 (×2): 1 [drp] via OPHTHALMIC
  Filled 2018-08-05: qty 5

## 2018-08-05 MED ORDER — ONDANSETRON HCL 4 MG PO TABS: 4.0000 mg | ORAL_TABLET | Freq: Four times a day (QID) | ORAL | Status: DC | PRN

## 2018-08-05 MED ORDER — ACETAMINOPHEN 325 MG PO TABS: 650.0000 mg | ORAL_TABLET | ORAL | Status: DC | PRN

## 2018-08-05 MED ORDER — LOSARTAN POTASSIUM 50 MG PO TABS
25.0000 mg | ORAL_TABLET | Freq: Two times a day (BID) | ORAL | Status: DC
  Administered 2018-08-05 – 2018-08-06 (×2): 25 mg via ORAL
  Filled 2018-08-05 (×2): qty 1

## 2018-08-05 MED ORDER — BUDESONIDE 0.25 MG/2ML IN SUSP
0.2500 mg | Freq: Two times a day (BID) | RESPIRATORY_TRACT | Status: DC
  Administered 2018-08-05 – 2018-08-06 (×2): 0.25 mg via RESPIRATORY_TRACT
  Filled 2018-08-05 (×3): qty 2

## 2018-08-05 MED ORDER — LORAZEPAM 0.5 MG PO TABS: 0.5000 mg | ORAL_TABLET | Freq: Two times a day (BID) | ORAL | Status: DC | PRN

## 2018-08-05 MED ORDER — POVIDONE-IODINE 7.5 % EX SOLN
Freq: Once | CUTANEOUS | Status: DC
  Filled 2018-08-05: qty 118

## 2018-08-05 MED ORDER — BENZONATATE 100 MG PO CAPS: 100.0000 mg | ORAL_CAPSULE | Freq: Three times a day (TID) | ORAL | Status: DC | PRN

## 2018-08-05 MED ORDER — GABAPENTIN 400 MG PO CAPS
800.0000 mg | ORAL_CAPSULE | Freq: Three times a day (TID) | ORAL | Status: DC
  Administered 2018-08-05 – 2018-08-06 (×2): 800 mg via ORAL
  Filled 2018-08-05 (×2): qty 2

## 2018-08-05 MED ORDER — EPHEDRINE SULFATE 50 MG/ML IJ SOLN
INTRAMUSCULAR | Status: DC | PRN
  Administered 2018-08-05 (×3): 5 mg via INTRAVENOUS

## 2018-08-05 MED ORDER — CEFAZOLIN SODIUM-DEXTROSE 2-4 GM/100ML-% IV SOLN
2.0000 g | Freq: Three times a day (TID) | INTRAVENOUS | Status: AC
  Administered 2018-08-05 – 2018-08-06 (×2): 2 g via INTRAVENOUS
  Filled 2018-08-05 (×2): qty 100

## 2018-08-05 MED ORDER — FENTANYL CITRATE (PF) 250 MCG/5ML IJ SOLN
INTRAMUSCULAR | Status: DC | PRN
  Administered 2018-08-05: 50 ug via INTRAVENOUS
  Administered 2018-08-05: 100 ug via INTRAVENOUS

## 2018-08-05 MED ORDER — ONDANSETRON HCL 4 MG/2ML IJ SOLN
INTRAMUSCULAR | Status: DC | PRN
  Administered 2018-08-05: 4 mg via INTRAVENOUS

## 2018-08-05 MED ORDER — MONTELUKAST SODIUM 10 MG PO TABS
10.0000 mg | ORAL_TABLET | Freq: Every day | ORAL | Status: DC
  Administered 2018-08-05: 10 mg via ORAL
  Filled 2018-08-05: qty 1

## 2018-08-05 MED ORDER — SODIUM CHLORIDE 0.9 % IV SOLN: 250.0000 mL | INTRAVENOUS | Status: DC

## 2018-08-05 MED ORDER — MIDAZOLAM HCL 2 MG/2ML IJ SOLN
INTRAMUSCULAR | Status: AC
  Filled 2018-08-05: qty 2

## 2018-08-05 MED ORDER — ACETAMINOPHEN 650 MG RE SUPP: 650.0000 mg | RECTAL | Status: DC | PRN

## 2018-08-05 MED ORDER — BUPIVACAINE LIPOSOME 1.3 % IJ SUSP
20.0000 mL | INTRAMUSCULAR | Status: AC
  Administered 2018-08-05: 20 mL
  Filled 2018-08-05: qty 20

## 2018-08-05 MED ORDER — ALBUTEROL SULFATE (2.5 MG/3ML) 0.083% IN NEBU: 2.5000 mg | INHALATION_SOLUTION | RESPIRATORY_TRACT | Status: DC | PRN

## 2018-08-05 MED ORDER — SODIUM CHLORIDE 0.9% FLUSH: 3.0000 mL | INTRAVENOUS | Status: DC | PRN

## 2018-08-05 MED ORDER — ONDANSETRON HCL 4 MG/2ML IJ SOLN
INTRAMUSCULAR | Status: AC
  Filled 2018-08-05: qty 2

## 2018-08-05 MED ORDER — BISACODYL 5 MG PO TBEC: 5.0000 mg | DELAYED_RELEASE_TABLET | Freq: Every day | ORAL | Status: DC | PRN

## 2018-08-05 MED ORDER — PROPOFOL 10 MG/ML IV BOLUS
INTRAVENOUS | Status: DC | PRN
  Administered 2018-08-05: 190 mg via INTRAVENOUS

## 2018-08-05 MED ORDER — ARTIFICIAL TEARS OPHTHALMIC OINT
TOPICAL_OINTMENT | OPHTHALMIC | Status: DC | PRN
  Administered 2018-08-05: 1 via OPHTHALMIC

## 2018-08-05 MED ORDER — DEXAMETHASONE SODIUM PHOSPHATE 10 MG/ML IJ SOLN
INTRAMUSCULAR | Status: AC
  Filled 2018-08-05: qty 1

## 2018-08-05 MED ORDER — SUGAMMADEX SODIUM 200 MG/2ML IV SOLN
INTRAVENOUS | Status: DC | PRN
  Administered 2018-08-05: 200 mg via INTRAVENOUS

## 2018-08-05 SURGICAL SUPPLY — 66 items
APL SKNCLS STERI-STRIP NONHPOA (GAUZE/BANDAGES/DRESSINGS) ×1
BENZOIN TINCTURE PRP APPL 2/3 (GAUZE/BANDAGES/DRESSINGS) ×2 IMPLANT
BLADE CLIPPER SURG (BLADE) ×2 IMPLANT
BLADE SURG 10 STRL SS (BLADE) ×2 IMPLANT
BLADE SURG 11 STRL SS (BLADE) ×1 IMPLANT
BONE VIVIGEN FORMABLE 5.4CC (Bone Implant) ×2 IMPLANT
CANISTER SUCT 3000ML PPV (MISCELLANEOUS) ×2 IMPLANT
COVER SURGICAL LIGHT HANDLE (MISCELLANEOUS) ×3 IMPLANT
COVER WAND RF STERILE (DRAPES) ×2 IMPLANT
DRAPE C-ARM 42X72 X-RAY (DRAPES) ×2 IMPLANT
DRAPE C-ARMOR (DRAPES) ×2 IMPLANT
DRAPE INCISE IOBAN 66X45 STRL (DRAPES) ×2 IMPLANT
DRAPE POUCH INSTRU U-SHP 10X18 (DRAPES) ×2 IMPLANT
DRAPE SURG 17X23 STRL (DRAPES) ×6 IMPLANT
DURAPREP 26ML APPLICATOR (WOUND CARE) ×2 IMPLANT
ELECT BLADE 4.0 EZ CLEAN MEGAD (MISCELLANEOUS) ×2
ELECT BLADE 6.5 EXT (BLADE) ×1 IMPLANT
ELECT CAUTERY BLADE 6.4 (BLADE) ×2 IMPLANT
ELECT REM PT RETURN 9FT ADLT (ELECTROSURGICAL) ×2
ELECTRODE BLDE 4.0 EZ CLN MEGD (MISCELLANEOUS) IMPLANT
ELECTRODE REM PT RTRN 9FT ADLT (ELECTROSURGICAL) ×1 IMPLANT
GAUZE 4X4 16PLY RFD (DISPOSABLE) ×2 IMPLANT
GAUZE SPONGE 4X4 12PLY STRL (GAUZE/BANDAGES/DRESSINGS) ×2 IMPLANT
GAUZE SPONGE 4X4 16PLY XRAY LF (GAUZE/BANDAGES/DRESSINGS) ×1 IMPLANT
GLOVE BIO SURGEON STRL SZ7 (GLOVE) ×2 IMPLANT
GLOVE BIO SURGEON STRL SZ8 (GLOVE) ×2 IMPLANT
GLOVE BIOGEL PI IND STRL 7.0 (GLOVE) ×1 IMPLANT
GLOVE BIOGEL PI IND STRL 8 (GLOVE) ×1 IMPLANT
GLOVE BIOGEL PI INDICATOR 7.0 (GLOVE) ×1
GLOVE BIOGEL PI INDICATOR 8 (GLOVE) ×1
GOWN STRL REUS W/ TWL LRG LVL3 (GOWN DISPOSABLE) ×2 IMPLANT
GOWN STRL REUS W/ TWL XL LVL3 (GOWN DISPOSABLE) ×1 IMPLANT
GOWN STRL REUS W/TWL LRG LVL3 (GOWN DISPOSABLE) ×4
GOWN STRL REUS W/TWL XL LVL3 (GOWN DISPOSABLE) ×2
GRAFT BNE MATRIX VG FRMBL MD 5 (Bone Implant) IMPLANT
HELMET PRONEVIEW D28501 (MISCELLANEOUS) ×1 IMPLANT
KIT BASIN OR (CUSTOM PROCEDURE TRAY) ×2 IMPLANT
KIT TURNOVER KIT B (KITS) ×2 IMPLANT
MANIFOLD NEPTUNE II (INSTRUMENTS) ×2 IMPLANT
NDL HYPO 25GX1X1/2 BEV (NEEDLE) ×1 IMPLANT
NEEDLE 22X1 1/2 (OR ONLY) (NEEDLE) ×2 IMPLANT
NEEDLE HYPO 25GX1X1/2 BEV (NEEDLE) ×2 IMPLANT
NS IRRIG 1000ML POUR BTL (IV SOLUTION) ×2 IMPLANT
PACK UNIVERSAL I (CUSTOM PROCEDURE TRAY) ×2 IMPLANT
PAD ARMBOARD 7.5X6 YLW CONV (MISCELLANEOUS) ×5 IMPLANT
PENCIL BUTTON HOLSTER BLD 10FT (ELECTRODE) ×2 IMPLANT
SCREW  SI-LOK 12X50MM (Screw) ×1 IMPLANT
SCREW SI-LOK 12MM HA SL 45MM (Screw) ×2 IMPLANT
SCREW SI-LOK 12X50MM (Screw) IMPLANT
SPONGE LAP 18X18 X RAY DECT (DISPOSABLE) ×2 IMPLANT
STAPLER VISISTAT 35W (STAPLE) ×2 IMPLANT
STRIP CLOSURE SKIN 1/2X4 (GAUZE/BANDAGES/DRESSINGS) ×2 IMPLANT
SUT MNCRL AB 4-0 PS2 18 (SUTURE) ×2 IMPLANT
SUT VIC AB 0 CT1 18XCR BRD 8 (SUTURE) IMPLANT
SUT VIC AB 0 CT1 8-18 (SUTURE)
SUT VIC AB 1 CT1 18XCR BRD 8 (SUTURE) ×1 IMPLANT
SUT VIC AB 1 CT1 8-18 (SUTURE) ×2
SUT VIC AB 2-0 CT2 18 VCP726D (SUTURE) ×2 IMPLANT
SYR BULB IRRIGATION 50ML (SYRINGE) ×4 IMPLANT
SYR CONTROL 10ML LL (SYRINGE) ×3 IMPLANT
TAPE CLOTH SURG 4X10 WHT LF (GAUZE/BANDAGES/DRESSINGS) ×1 IMPLANT
TOWEL OR 17X24 6PK STRL BLUE (TOWEL DISPOSABLE) ×2 IMPLANT
TOWEL OR 17X26 10 PK STRL BLUE (TOWEL DISPOSABLE) ×4 IMPLANT
TUBE CONNECTING 12X1/4 (SUCTIONS) ×2 IMPLANT
WATER STERILE IRR 1000ML POUR (IV SOLUTION) ×2 IMPLANT
YANKAUER SUCT BULB TIP NO VENT (SUCTIONS) ×2 IMPLANT

## 2018-08-05 NOTE — Anesthesia Procedure Notes (Signed)
Procedure Name: Intubation Date/Time: 08/05/2018 1:31 PM Performed by: Bryson Corona, CRNA Pre-anesthesia Checklist: Patient identified, Emergency Drugs available, Suction available and Patient being monitored Patient Re-evaluated:Patient Re-evaluated prior to induction Oxygen Delivery Method: Circle System Utilized Preoxygenation: Pre-oxygenation with 100% oxygen Induction Type: IV induction Ventilation: Mask ventilation without difficulty Laryngoscope Size: Mac and 4 Grade View: Grade II Tube type: Oral Tube size: 7.0 mm Number of attempts: 1 Airway Equipment and Method: Stylet and Oral airway Placement Confirmation: ETT inserted through vocal cords under direct vision,  positive ETCO2 and breath sounds checked- equal and bilateral Secured at: 21 cm Tube secured with: Tape Dental Injury: Teeth and Oropharynx as per pre-operative assessment

## 2018-08-05 NOTE — H&P (Signed)
PREOPERATIVE H&P  Chief Complaint: Left low back pain  HPI: Ashley [name] is a 62 y.o. female who presents with ongoing pain in the low back on the left. Patient is s/p a previous left SI fusion procedure, which did go on to a nonunion. She has had ongoing pain over this region, very likely from her nonunion.   Patient has failed multiple forms of conservative care and continues to have pain (see office notes for additional details regarding the patient's full course of treatment)  Past Medical History:  Diagnosis Date  . Arthritis   . Depression   . Fibromyalgia 09/04/2014  . GERD (gastroesophageal reflux disease)   . Headache   . Hypertension   . Lumbar radicular pain 09/04/2014  . Pre-diabetes   . Sacroiliac joint pain    nonunion of left SI joint  . Spinal headache   . TIA (transient ischemic attack)   . Wears glasses    Past Surgical History:  Procedure Laterality Date  . ABDOMINAL HYSTERECTOMY    . BREAST LUMPECTOMY Left 1985  . BREAST SURGERY    . BUNIONECTOMY    . COLONOSCOPY    . DILATION AND CURETTAGE OF UTERUS    . ESOPHAGEAL MANOMETRY [name] 11/21/2013   Procedure: ESOPHAGEAL MANOMETRY (EM);  Surgeon: Shirley Friar, MD;  Location: [facility];  Service: Endoscopy;  Laterality: [name];  . KNEE ARTHROSCOPY W/ MENISCAL REPAIR    . LUMBAR LAMINECTOMY    . MULTIPLE TOOTH EXTRACTIONS    . NASAL SINUS SURGERY    . TONSILLECTOMY     Social History   Socioeconomic History  . Marital status: Married    Spouse name: Not on file  . Number of children: Not on file  . Years of education: Not on file  . Highest education level: Not on file  Occupational History  . Not on file  Social Needs  . Financial resource strain: Not on file  . Food insecurity:    Worry: Not on file    Inability: Not on file  . Transportation needs:    Medical: Not on file    Non-medical: Not on file  Tobacco Use  . Smoking status: Former Smoker    Packs/day: 0.75   Years: 24.00    Pack years: 18.00    Types: Cigarettes    Last attempt to quit: 11/03/1992    Years since quitting: 25.7  . Smokeless tobacco: Never Used  Substance and Sexual Activity  . Alcohol use: Yes    Alcohol/week: 1.0 standard drinks    Types: 1 Glasses of wine per week    Comment: occasionally  . Drug use: No  . Sexual activity: Not on file  Lifestyle  . Physical activity:    Days per week: Not on file    Minutes per session: Not on file  . Stress: Not on file  Relationships  . Social connections:    Talks on phone: Not on file    Gets together: Not on file    Attends religious service: Not on file    Active member of club or organization: Not on file    Attends meetings of clubs or organizations: Not on file    Relationship status: Not on file  Other Topics Concern  . Not on file  Social History Narrative  . Not on file   Family History  Problem Relation Age of Onset  . Aneurysm Father   . Bipolar disorder Brother   .  Diabetes Brother   . Migraines Mother   . Eczema Sister   . Urticaria Sister   . Migraines Daughter   . Urticaria Daughter   . Asthma Daughter   . Cancer Other   . Hypertension Other   . Diabetes Other   . Allergic rhinitis Neg Hx   . Angioedema Neg Hx   . Immunodeficiency Neg Hx    Allergies  Allergen Reactions  . Latex Itching, Swelling and Other (See Comments)    redness  . Sulfa Antibiotics Diarrhea and Nausea And Vomiting  . Aspirin Other (See Comments)    Can cause her to bleed out due to G6 PD  . Oxycodone Itching    "all over"  . Tramadol Other (See Comments)    Makes her feel "spaced out on street drugs"   Prior to Admission medications   Medication Sig Start Date End Date Taking? Authorizing Provider  acetaminophen (TYLENOL) 650 MG CR tablet Take 1,300 mg by mouth every 8 (eight) hours as needed for pain.   Yes [provider]  acetaminophen-codeine (TYLENOL #3) 300-30 MG tablet Take 1 tablet by mouth every 6  (six) hours as needed (for pain).    Yes [provider]  albuterol (PROVENTIL HFA;VENTOLIN HFA) 108 (90 Base) MCG/ACT inhaler Inhale 1-2 puffs into the lungs every 6 (six) hours as needed for wheezing or shortness of breath. Patient taking differently: Inhale 2 puffs into the lungs every 4 (four) hours as needed for wheezing or shortness of breath.  07/28/17  Yes Leslye Peer, MD  albuterol (PROVENTIL) (2.5 MG/3ML) 0.083% nebulizer solution USE 1 VIAL VIA NEBULIZER EVERY 4 HOURS AS NEEDED FOR WHEEZING OR SHORTNESS OF BREATH Patient taking differently: Take 2.5 mg by nebulization every 6 (six) hours as needed for wheezing or shortness of breath.  07/07/17  Yes Leslye Peer, MD  azelastine (OPTIVAR) 0.05 % ophthalmic solution Place 1 drop into the left eye 2 (two) times daily.   Yes [provider]  baclofen (LIORESAL) 20 MG tablet Take 1 tablet (20 mg total) by mouth 3 (three) times daily. 07/01/15  Yes West, Emily, PA-C  beclomethasone (QVAR REDIHALER) 80 MCG/ACT inhaler Inhale 2 puffs into the lungs 2 (two) times daily. 08/03/18  Yes Leslye Peer, MD  benzonatate (TESSALON) 100 MG capsule Take 100 mg by mouth 3 (three) times daily as needed for cough.   Yes [provider]  desloratadine (CLARINEX) 5 MG tablet Take 5 mg by mouth daily as needed (allergies).    Yes [provider]  diclofenac sodium (VOLTAREN) 1 % GEL Apply 2-4 g topically 4 (four) times daily as needed (for pain).   Yes [provider]  DULoxetine (CYMBALTA) 60 MG capsule Take 60 mg by mouth daily.    Yes [provider]  fluticasone (FLONASE) 50 MCG/ACT nasal spray Place 2 sprays into both nostrils 2 (two) times daily. 08/27/17  Yes Leslye Peer, MD  gabapentin (NEURONTIN) 800 MG tablet Take 1 tablet (800 mg total) by mouth 3 (three) times daily. 07/07/17  Yes Kirsteins, Victorino Sparrow, MD  hydrochlorothiazide (HYDRODIURIL) 12.5 MG tablet Take 12.5 mg by mouth 2 (two) times daily.  07/23/18  Yes [provider]  HYDROcodone-acetaminophen (NORCO/VICODIN) 5-325 MG tablet Take 1 tablet by mouth every 6 (six) hours as needed (for pain).   Yes [provider]  LORazepam (ATIVAN) 1 MG tablet Take 0.5-1 mg by mouth 2 (two) times daily as needed (stress/elevated blood pressure).  Yes [provider]  losartan (COZAAR) 25 MG tablet Take 25 mg by mouth 2 (two) times daily. 07/23/18  Yes [provider]  MAGNESIUM CHLORIDE-CALCIUM PO Take 1 tablet by mouth daily at 3 pm.   Yes [provider]  methocarbamol (ROBAXIN) 500 MG tablet Take 500 mg by mouth every 8 (eight) hours as needed for muscle spasms.   Yes [provider]  montelukast (SINGULAIR) 10 MG tablet TAKE 1 TABLET BY MOUTH AT BEDTIME Patient taking differently: Take 10 mg by mouth at bedtime.  04/19/18  Yes Leslye Peer, MD  Multiple Vitamin (MULTIVITAMIN WITH MINERALS) TABS tablet Take 1 tablet by mouth daily at 3 pm. Centrum for Women   Yes [provider]  naproxen (NAPROSYN) 500 MG tablet Take 500 mg by mouth 2 (two) times daily. 07/26/18  Yes [provider]  nortriptyline (PAMELOR) 25 MG capsule Take 1 capsule (25 mg total) by mouth at bedtime. 04/03/17  Yes Nilda Riggs, NP  ondansetron (ZOFRAN) 4 MG tablet Take 1 tablet (4 mg total) by mouth every 8 (eight) hours as needed for nausea or vomiting. 04/21/16  Yes Nilda Riggs, NP  pantoprazole (PROTONIX) 40 MG tablet Take 40 mg by mouth 2 (two) times daily.    Yes [provider]  PARoxetine Mesylate (BRISDELLE) 7.5 MG CAPS Take 7.5 mg by mouth at bedtime.    Yes [provider]  SUPER B COMPLEX/C PO Take 1 capsule by mouth daily at 3 pm.   Yes [provider]  TURMERIC PO Take 1 capsule by mouth daily at 3 pm.   Yes [provider]  budesonide-formoterol (SYMBICORT) 160-4.5 MCG/ACT inhaler Inhale 2 puffs into the lungs 2 (two) times daily. Patient not  taking: Reported on 08/04/2018 02/16/18   Lupita Leash, MD     All other systems have been reviewed and were otherwise negative with the exception of those mentioned in the HPI and as above.  Physical Exam: There were no vitals filed for this visit.  There is no height or weight on file to calculate BMI.  General: Alert, no acute distress Cardiovascular: No pedal edema Respiratory: No cyanosis, no use of accessory musculature Skin: No lesions in the area of chief complaint Neurologic: Sensation intact distally Psychiatric: Patient is competent for consent with normal mood and affect Lymphatic: No axillary or cervical lymphadenopathy  MUSCULOSKELETAL: + TTP left low back  Assessment/Plan: LEFT SACROILIAC JOINT NONUNION Plan for Procedure(s): REVISION LEFT SACROILIAC JOINT FUSION WITH INSTRUMENTATION AND ALLOGRAFT   Emilee Hero, MD 08/05/2018 7:32 AM

## 2018-08-05 NOTE — Transfer of Care (Signed)
Immediate Anesthesia Transfer of Care Note  Patient: Ashley [name]  Procedure(s) Performed: REVISION LEFT SACROILIAC JOINT FUSION WITH INSTRUMENTATION AND ALLOGRAFT (Left Back)  Patient Location: PACU  Anesthesia Type:General  Level of Consciousness: drowsy and patient cooperative  Airway & Oxygen Therapy: Patient Spontanous Breathing and Patient connected to nasal cannula oxygen  Post-op Assessment: Report given to RN and Post -op Vital signs reviewed and stable  Post vital signs: Reviewed and stable  Last Vitals:  Vitals Value Taken Time  BP 139/73 08/05/2018  3:54 PM  Temp    Pulse 108 08/05/2018  3:56 PM  Resp 25 08/05/2018  3:56 PM  SpO2 89 % 08/05/2018  3:56 PM  Vitals shown include unvalidated device data.  Last Pain:  Vitals:   08/05/18 0955  TempSrc:   PainSc: 8       Patients Stated Pain Goal: 5 (08/05/18 0955)  Complications: No apparent anesthesia complications

## 2018-08-05 NOTE — Anesthesia Preprocedure Evaluation (Signed)
Anesthesia Evaluation  Patient identified by MRN, date of birth, ID band Patient awake    Reviewed: Allergy & Precautions, H&P , NPO status , Patient's Chart, lab work & pertinent test results  History of Anesthesia Complications (+) POST - OP SPINAL HEADACHE and history of anesthetic complications  Airway Mallampati: II   Neck ROM: full    Dental   Pulmonary asthma , former smoker,    breath sounds clear to auscultation       Cardiovascular hypertension,  Rhythm:regular Rate:Normal     Neuro/Psych  Headaches, PSYCHIATRIC DISORDERS Depression TIA Neuromuscular disease    GI/Hepatic GERD  ,  Endo/Other  obese  Renal/GU      Musculoskeletal  (+) Arthritis , Fibromyalgia -  Abdominal   Peds  Hematology   Anesthesia Other Findings   Reproductive/Obstetrics                             Anesthesia Physical Anesthesia Plan  ASA: III  Anesthesia Plan: General   Post-op Pain Management:    Induction: Intravenous  PONV Risk Score and Plan: 3 and Ondansetron, Dexamethasone, Midazolam and Treatment may vary due to age or medical condition  Airway Management Planned: Oral ETT  Additional Equipment:   Intra-op Plan:   Post-operative Plan: Extubation in OR  Informed Consent: I have reviewed the patients History and Physical, chart, labs and discussed the procedure including the risks, benefits and alternatives for the proposed anesthesia with the patient or authorized representative who has indicated his/her understanding and acceptance.     Plan Discussed with: CRNA, Anesthesiologist and Surgeon  Anesthesia Plan Comments:         Anesthesia Quick Evaluation

## 2018-08-05 NOTE — Op Note (Signed)
NAME:  Ashley [name]          MEDICAL RECORD NO.:  [id number]  PHYSICIAN:  Estill Bamberg, MD      DATE OF BIRTH:  1956/05/29  DATE OF PROCEDURE:  08/05/2018                              OPERATIVE REPORT   PREOPERATIVE DIAGNOSES: 1. Left-sided sacroiliac joint nonunion  POSTOPERATIVE DIAGNOSES: 1. Left-sided sacroiliac joint nonunion  PROCEDURES:    1.  Removal of previously placed left sacroiliac joint implants 2.  Placement of left revision sacroiliac joint implants 3.  Revision left-sided sacroiliac joint fusion using allograft (Vivigen)  SURGEON:  Estill Bamberg, MD.  ASSISTANTJason Coop, PA-C.  ANESTHESIA:  General endotracheal anesthesia.  COMPLICATIONS:  None.  DISPOSITION:  Stable.  ESTIMATED BLOOD LOSS:  Minimal.  INDICATIONS FOR SURGERY:  Briefly, [name] is a pleasant 62 year old female, who did previously undergo a left sacroiliac joint fusion.  She initially did well, but then had a recurrence of her preoperative pain.  A CAT scan did confirm a nonunion.  We did therefore discuss proceeding with the procedure noted above.  The patient did wish to proceed after full understanding of the risks and benefits of surgery.  OPERATIVE DETAILS:  On 08/05/2018, the patient was brought to surgery and general endotracheal anesthesia was administered.  The patient was placed prone on a well-padded flat Jackson bed with gelrolls placed over the patient's chest and hips. Antibiotics were given.  The region of the left buttock was prepped and draped and a time-out procedure was performed.   At this point, the patient's previous incision was reopened using a knife.  I did dissect down to the outer aspect of the left sacroiliac joint.  The previously placed implants were noted.  Using the extraction tools, the 3 implants that were previously placed were uneventfully removed.  I did need to use a chisel to remove the bone adherent to the medial  aspect of the implants.  Once the implants were removed, I did use a 12 mm tap to prepare the trajectory of the revision implants, and to prepare the sacrum and ilium for fusion.  The same trajectory and positioning was used, as it was felt that the previous positioning of the implants were ideal.  Once all 3 holes were tapped, I did place a 12 mm fully threaded screws across the sacroiliac joint.  These were slotted screws, and the cannulated inner aspect of the screws were filled with allograft in the form of Vivgen.  Excellent purchase of all 3 screws were as noted.  I was very pleased with the inlet, outlet, and lateral radiographs.  At this point, the wound was copiously irrigated.  The wound was then closed in layers using #1 Vicryl followed by 0 Vicryl, followed by 4-0 Monocryl. Benzoin and Steri-Strips were applied followed by a sterile dressing. All instrument counts were correct at the termination of the procedure.  Of note, Jason Coop was my assistant throughout surgery, and did aid in retraction, suctioning, and closure from start to finish.     Estill Bamberg, MD

## 2018-08-06 ENCOUNTER — Encounter (HOSPITAL_COMMUNITY): Payer: Self-pay | Admitting: Orthopedic Surgery

## 2018-08-06 DIAGNOSIS — M960 Pseudarthrosis after fusion or arthrodesis: Secondary | ICD-10-CM | POA: Diagnosis not present

## 2018-08-06 NOTE — Evaluation (Signed)
Occupational Therapy Evaluation Patient Details Name: Ashley [name] MRN: [id number] DOB: May 28, 1956 Today's Date: 08/06/2018    History of Present Illness This 62 y.o. female admitted for revision Lt SI joint due to nonunion.  PMH includes: Arthritis, depression, fibromyalgia, TIA.  Pt reports recent trigger finger release Rt middle finger.     Clinical Impression   Patient evaluated by Occupational Therapy with no further acute OT needs identified. All education has been completed and the patient has no further questions. All education completed.  Pt requires min guard assist to supervision for ADLs.  She will need assist for IADLs.  See below for any follow-up Occupational Therapy or equipment needs. OT is signing off. Thank you for this referral.      Follow Up Recommendations  Home health OT;Supervision - Intermittent  Recommend [facility],    Equipment Recommendations  3 in 1 bedside commode;Tub/shower bench;Other (comment)(long reacher, walker bag )    Recommendations for Other Services       Precautions / Restrictions Precautions Precautions: Back Precaution Comments: Pt instructed in no bending, twisting, or lifting  Restrictions Weight Bearing Restrictions: Yes LLE Weight Bearing: Non weight bearing      Mobility Bed Mobility Overal bed mobility: Needs Assistance Bed Mobility: Sit to Sidelying         Sit to sidelying: Supervision General bed mobility comments: Pt demonstrates ability to log roll safely   Transfers Overall transfer level: Needs assistance Equipment used: Rolling walker (2 wheeled) Transfers: Sit to/from [employer] Sit to Stand: Supervision Stand pivot transfers: Supervision       General transfer comment: Pt initially required min guard assist, but progressed to supervision.   verbal cues for hand placement     Balance Overall balance assessment: Mild deficits observed, not formally tested                                          ADL either performed or assessed with clinical judgement   ADL Overall ADL's : Needs assistance/impaired Eating/Feeding: Independent   Grooming: Wash/dry hands;Supervision/safety;Standing;Oral care Grooming Details (indicate cue type and reason): instructed her to lean into wall to stablize self if she takes both hands off walker, otherwise, she should keep one hand on walker at all times  Upper Body Bathing: Set up;Sitting   Lower Body Bathing: Supervison/ safety;Sit to/from stand Lower Body Bathing Details (indicate cue type and reason): pt able to cross ankles over knees  Upper Body Dressing : Supervision/safety;Sitting   Lower Body Dressing: Supervision/safety;Sit to/from stand Lower Body Dressing Details (indicate cue type and reason): Pt able to cross ankles over knees  Toilet Transfer: Supervision/safety;Ambulation;Comfort height toilet;Grab bars;RW   Toileting- Clothing Manipulation and Hygiene: Supervision/safety;Sit to/from stand   Tub/ Shower Transfer: Education officer, environmental Details (indicate cue type and reason): Pt instructed in use of tub transfer bench for shower use as it has grab bar on side.  Instructed her to have therapist practice with her prior to attempting.   Functional mobility during ADLs: Supervision/safety;Rolling walker General ADL Comments: Pt demonstrates good understanding of NWB.  Requires cues for correct hand placement      Vision Baseline Vision/History: Wears glasses Wears Glasses: At all times       Perception     Praxis      Pertinent Vitals/Pain Pain Assessment: No/denies pain     Hand Dominance Right  Extremity/Trunk Assessment Upper Extremity Assessment Upper Extremity Assessment: Overall WFL for tasks assessed   Lower Extremity Assessment Lower Extremity Assessment: Defer to PT evaluation   Cervical / Trunk Assessment Cervical / Trunk Assessment: Normal   Communication  Communication Communication: No difficulties   Cognition Arousal/Alertness: Awake/alert Behavior During Therapy: WFL for tasks assessed/performed Overall Cognitive Status: Within Functional Limits for tasks assessed                                     General Comments  walker splint provided to pt to reduce heavy grasping on RW due to recent trigger finger release.  reviewed use of walker bag, and how to perform meal prep safely at walker level.  recommend long reacher for home use     Exercises     Shoulder Instructions      Home Living Family/patient expects to be discharged to:: Private residence Living Arrangements: Other relatives Available Help at Discharge: Family;Available PRN/intermittently Type of Home: House Home Access: Stairs to enter Entrance Stairs-Number of Steps: 2+ small stoop Entrance Stairs-Rails: None Home Layout: One level     Bathroom Shower/Tub: Producer, television/film/video: Standard Bathroom Accessibility: Yes How Accessible: Accessible via walker Home Equipment: Crutches   Additional Comments: Pt's brother, who works, and 76 y.o. nephew, who is in school, live with her       Prior Functioning/Environment Level of Independence: Independent                 OT Problem List: Impaired balance (sitting and/or standing);Decreased knowledge of precautions;Decreased knowledge of use of DME or AE      OT Treatment/Interventions:      OT Goals(Current goals can be found in the care plan section) Acute Rehab OT Goals Patient Stated Goal: to be able to put weight on foot and walk normally  OT Goal Formulation: All assessment and education complete, DC therapy  OT Frequency:     Barriers to D/C:            Co-evaluation PT/OT/SLP Co-Evaluation/Treatment: Yes Reason for Co-Treatment: For patient/therapist safety   OT goals addressed during session: ADL's and self-care      AM-PAC PT "6 Clicks" Daily Activity      Outcome Measure Help from another person eating meals?: None Help from another person taking care of personal grooming?: A Little Help from another person toileting, which includes using toliet, bedpan, or urinal?: A Little Help from another person bathing (including washing, rinsing, drying)?: A Little Help from another person to put on and taking off regular upper body clothing?: None Help from another person to put on and taking off regular lower body clothing?: A Little 6 Click Score: 20   End of Session Equipment Utilized During Treatment: Gait belt;Rolling walker Nurse Communication: Mobility status  Activity Tolerance: Patient tolerated treatment well Patient left: in bed;with call bell/phone within reach  OT Visit Diagnosis: Unsteadiness on feet (R26.81)                Time: 1191-4782 OT Time Calculation (min): 53 min Charges:  OT General Charges $OT Visit: 1 Visit OT Evaluation $OT Eval Moderate Complexity: 1 Mod OT Treatments $Self Care/Home Management : 8-22 mins  Jeani Hawking, OTR/L Acute Rehabilitation Services Pager [phone] Office [phone]   Jeani Hawking M 08/06/2018, 10:07 AM

## 2018-08-06 NOTE — Progress Notes (Signed)
Patient alert and oriented, mae's well, voiding adequate amount of urine, swallowing without difficulty,  c/o mild pain at time of discharge and ice pack given for comfort.Patient discharged home with family. Script and discharged instructions given to patient. Patient and family stated understanding of instructions given. Patient has an appointment with Dr. Yevette Edwards

## 2018-08-06 NOTE — Evaluation (Signed)
Physical Therapy Evaluation Patient Details Name: Ashley [name] MRN: [id number] DOB: 05/13/1956 Today's Date: 08/06/2018   History of Present Illness  Pt is a 62 y.o. 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  PMH includes: Arthritis, depression, fibromyalgia, TIA.  Pt reports recent trigger finger release Rt middle finger.    Clinical Impression  Pt presented supine in bed with HOB elevated, awake and willing to participate in therapy session. Prior to admission, pt reported that she was independent with all functional mobility. She currently requires supervision for bed mobility, min guard for transfers, min guard to ambulate a short distance with RW and cueing for NWB L LE. Pt also participated in stair training this session with min A to stability RW. Pt would continue to benefit from skilled physical therapy services at this time while admitted and after d/c to address the below listed limitations in order to improve overall safety and independence with functional mobility.     Follow Up Recommendations Home health PT;Supervision for mobility/OOB    Equipment Recommendations  Rolling walker with 5" wheels;3in1 (PT);Other (comment)(tub bench)    Recommendations for Other Services       Precautions / Restrictions Precautions Precautions: Fall Precaution Comments: Pt instructed in no bending, twisting, or lifting  Restrictions Weight Bearing Restrictions: Yes LLE Weight Bearing: Non weight bearing      Mobility  Bed Mobility Overal bed mobility: Needs Assistance Bed Mobility: Sidelying to Sit   Sidelying to sit: Supervision     Sit to sidelying: Supervision General bed mobility comments: increased time, good technique, supervision for safety  Transfers Overall transfer level: Needs assistance Equipment used: Rolling walker (2 wheeled) Transfers: Sit to/from Stand Sit to Stand: Min guard Stand pivot transfers: Supervision       General  transfer comment: min guard with cueing for technique and safe hand placement  Ambulation/Gait Ambulation/Gait assistance: Min guard Gait Distance (Feet): 15 Feet Assistive device: Rolling walker (2 wheeled) Gait Pattern/deviations: (hop-to on R LE) Gait velocity: decreased Gait velocity interpretation: <1.31 ft/sec, indicative of household ambulator General Gait Details: pt steady with RW, maintaining NWB L LE with occasional cueing  Stairs Stairs: Yes Stairs assistance: Min assist;+2 safety/equipment Stair Management: No rails;Step to pattern;Backwards;With walker Number of Stairs: 2 General stair comments: cueing and demonstration for technique with RW (no railing at home); min A to stability front of walker  Wheelchair Mobility    Modified Rankin (Stroke Patients Only)       Balance Overall balance assessment: Needs assistance Sitting-balance support: No upper extremity supported Sitting balance-Leahy Scale: Good     Standing balance support: Bilateral upper extremity supported Standing balance-Leahy Scale: Poor                               Pertinent Vitals/Pain Pain Assessment: No/denies pain    Home Living Family/patient expects to be discharged to:: Private residence Living Arrangements: Other relatives Available Help at Discharge: Family;Available PRN/intermittently Type of Home: House Home Access: Stairs to enter Entrance Stairs-Rails: None Entrance Stairs-Number of Steps: 2+ small stoop Home Layout: One level Home Equipment: Crutches Additional Comments: Pt's brother, who works, and 68 y.o. nephew, who is in school, live with her     Prior Function Level of Independence: Independent               Hand Dominance   Dominant Hand: Right    Extremity/Trunk Assessment   Upper  Extremity Assessment Upper Extremity Assessment: Overall WFL for tasks assessed    Lower Extremity Assessment Lower Extremity Assessment: LLE  deficits/detail LLE Deficits / Details: pt with decreased strength and AROM limitations secondary to post-op weakness. Pt able to maintain NWB L LE with cueing    Cervical / Trunk Assessment Cervical / Trunk Assessment: Normal  Communication   Communication: No difficulties  Cognition Arousal/Alertness: Awake/alert Behavior During Therapy: WFL for tasks assessed/performed Overall Cognitive Status: Within Functional Limits for tasks assessed                                        General Comments General comments (skin integrity, edema, etc.): walker splint provided to pt to reduce heavy grasping on RW due to recent trigger finger release.  reviewed use of walker bag, and how to perform meal prep safely at walker level.  recommend long reacher for home use     Exercises     Assessment/Plan    PT Assessment Patient needs continued PT services  PT Problem List Decreased strength;Decreased range of motion;Decreased activity tolerance;Decreased balance;Decreased mobility;Decreased coordination;Decreased knowledge of use of DME;Decreased safety awareness;Decreased knowledge of precautions;Pain       PT Treatment Interventions DME instruction;Gait training;Stair training;Functional mobility training;Therapeutic activities;Balance training;Neuromuscular re-education;Therapeutic exercise;Patient/family education    PT Goals (Current goals can be found in the Care Plan section)  Acute Rehab PT Goals Patient Stated Goal: to walk and work out again PT Goal Formulation: With patient Time For Goal Achievement: 08/20/18 Potential to Achieve Goals: Good    Frequency Min 3X/week   Barriers to discharge        Co-evaluation PT/OT/SLP Co-Evaluation/Treatment: Yes Reason for Co-Treatment: For patient/therapist safety;To address functional/ADL transfers PT goals addressed during session: Mobility/safety with mobility;Balance;Proper use of DME;Strengthening/ROM OT goals  addressed during session: ADL's and self-care       AM-PAC PT "6 Clicks" Daily Activity  Outcome Measure Difficulty turning over in bed (including adjusting bedclothes, sheets and blankets)?: A Little Difficulty moving from lying on back to sitting on the side of the bed? : A Little Difficulty sitting down on and standing up from a chair with arms (e.g., wheelchair, bedside commode, etc,.)?: Unable Help needed moving to and from a bed to chair (including a wheelchair)?: A Little Help needed walking in hospital room?: A Little Help needed climbing 3-5 steps with a railing? : A Little 6 Click Score: 16    End of Session Equipment Utilized During Treatment: Gait belt Activity Tolerance: Patient tolerated treatment well Patient left: in chair;with call bell/phone within reach;Other (comment)(OT present) Nurse Communication: Mobility status PT Visit Diagnosis: Other abnormalities of gait and mobility (R26.89)    Time: 1610-9604 PT Time Calculation (min) (ACUTE ONLY): 45 min   Charges:   PT Evaluation $PT Eval Moderate Complexity: 1 Mod PT Treatments $Gait Training: 8-22 mins        Deborah Chalk, PT, DPT  Acute Rehabilitation Services Pager [phone] Office [phone]    Alessandra Bevels Demetria Lightsey 08/06/2018, 10:19 AM

## 2018-08-06 NOTE — Progress Notes (Addendum)
    Patient doing well  Moderate expected left LBP   Physical Exam: Vitals:   08/05/18 2332 08/06/18 0308  BP: (!) 156/77 135/70  Pulse: (!) 106 86  Resp: 20 18  Temp: 98.3 F (36.8 C) 97.9 F (36.6 C)  SpO2: 97% 94%    Dressing in place NVI  POD #1 s/p revision SI fusion procedure, with revision instrumentation  - encourage ambulation (NWB on the left) - Percocet for pain, Valium for muscle spasms - d/c home today with f/u in 2 weeks

## 2018-08-06 NOTE — Anesthesia Postprocedure Evaluation (Signed)
Anesthesia Post Note  Patient: Ashley [name]  Procedure([name]) Performed: REVISION LEFT SACROILIAC JOINT FUSION WITH INSTRUMENTATION AND ALLOGRAFT (Left Back)     Patient location during evaluation: PACU Anesthesia Type: General Level of consciousness: awake and alert Pain management: pain level controlled Vital Signs Assessment: post-procedure vital signs reviewed and stable Respiratory status: spontaneous breathing, nonlabored ventilation, respiratory function stable and patient connected to nasal cannula oxygen Cardiovascular status: blood pressure returned to baseline and stable Postop Assessment: no apparent nausea or vomiting Anesthetic complications: no    Last Vitals:  Vitals:   08/06/18 0308 08/06/18 0743  BP: 135/70 136/67  Pulse: 86 85  Resp: 18 19  Temp: 36.6 C 36.7 C  SpO2: 94% 98%    Last Pain:  Vitals:   08/06/18 0743  TempSrc: Oral  PainSc:                  Ashley [name]

## 2018-08-06 NOTE — Care Management Note (Addendum)
Case Management Note  Patient Details  Name: Ashley [name] MRN: [id number] Date of Birth: 09/19/1956  Subjective/Objective:            Patient provided the following information-         Cheri Worker's Comp Case Manager [phone] [employer] Bill Processing Administrator [phone] Claim Case Number- [id number] [facility] Provider Number   Action/Plan:  Patient needs [facility] and DME. LM w Cheri requesting call back. VM stated that it may take 2 days for call to be returned.  In process of trying to arrange [facility] DME w [employer]. On hold over 25 minutes.   Spoke w Jesusita Oka of [facility], he will provide RW and 3/1 to bedside, and have tub bench delivered to house. He will accept referral for [facility] and process through [employer].   Patient and nurse updated.   Expected Discharge Date:  08/06/18               Expected Discharge Plan:     In-House Referral:     Discharge planning Services  CM Consult  Post Acute Care Choice:  Home Health, Durable Medical Equipment Choice offered to:     DME Arranged:  3-N-1, Walker rolling, Tub bench DME Agency:     HH Arranged:  PT, OT HH Agency:     Status of Service:  In process, will continue to follow  If discussed at Long Length of Stay Meetings, dates discussed:    Additional Comments:  Lawerance Sabal, RN 08/06/2018, 10:06 AM

## 2018-08-17 DIAGNOSIS — I10 Essential (primary) hypertension: Secondary | ICD-10-CM | POA: Diagnosis not present

## 2018-08-17 DIAGNOSIS — K219 Gastro-esophageal reflux disease without esophagitis: Secondary | ICD-10-CM | POA: Diagnosis not present

## 2018-08-17 DIAGNOSIS — R739 Hyperglycemia, unspecified: Secondary | ICD-10-CM | POA: Diagnosis not present

## 2018-09-08 DIAGNOSIS — Z23 Encounter for immunization: Secondary | ICD-10-CM | POA: Diagnosis not present

## 2018-09-08 DIAGNOSIS — Z13220 Encounter for screening for lipoid disorders: Secondary | ICD-10-CM | POA: Diagnosis not present

## 2018-10-07 ENCOUNTER — Other Ambulatory Visit: Payer: Self-pay | Admitting: Family Medicine

## 2018-10-07 DIAGNOSIS — Z1231 Encounter for screening mammogram for malignant neoplasm of breast: Secondary | ICD-10-CM

## 2018-10-07 DIAGNOSIS — M25831 Other specified joint disorders, right wrist: Secondary | ICD-10-CM | POA: Diagnosis not present

## 2018-10-07 DIAGNOSIS — S300XXD Contusion of lower back and pelvis, subsequent encounter: Secondary | ICD-10-CM | POA: Diagnosis not present

## 2018-10-15 ENCOUNTER — Other Ambulatory Visit: Payer: Self-pay | Admitting: Orthopedic Surgery

## 2018-10-15 DIAGNOSIS — M65331 Trigger finger, right middle finger: Secondary | ICD-10-CM | POA: Diagnosis not present

## 2018-10-18 ENCOUNTER — Other Ambulatory Visit: Payer: Self-pay | Admitting: Orthopedic Surgery

## 2018-10-18 DIAGNOSIS — M545 Low back pain: Secondary | ICD-10-CM | POA: Diagnosis not present

## 2018-10-18 DIAGNOSIS — M79641 Pain in right hand: Secondary | ICD-10-CM

## 2018-10-20 DIAGNOSIS — M1990 Unspecified osteoarthritis, unspecified site: Secondary | ICD-10-CM | POA: Diagnosis not present

## 2018-10-20 DIAGNOSIS — M19042 Primary osteoarthritis, left hand: Secondary | ICD-10-CM | POA: Diagnosis not present

## 2018-10-20 DIAGNOSIS — M79642 Pain in left hand: Secondary | ICD-10-CM | POA: Diagnosis not present

## 2018-10-20 DIAGNOSIS — M5136 Other intervertebral disc degeneration, lumbar region: Secondary | ICD-10-CM | POA: Diagnosis not present

## 2018-10-20 DIAGNOSIS — M2550 Pain in unspecified joint: Secondary | ICD-10-CM | POA: Diagnosis not present

## 2018-11-03 DIAGNOSIS — F419 Anxiety disorder, unspecified: Secondary | ICD-10-CM

## 2018-11-03 HISTORY — DX: Anxiety disorder, unspecified: F41.9

## 2018-11-05 ENCOUNTER — Ambulatory Visit
Admission: RE | Admit: 2018-11-05 | Discharge: 2018-11-05 | Disposition: A | Discharge status: Home / Self Care | Payer: Federal, State, Local not specified - PPO | Source: Ambulatory Visit | Attending: Orthopedic Surgery | Admitting: Orthopedic Surgery

## 2018-11-05 DIAGNOSIS — M79641 Pain in right hand: Secondary | ICD-10-CM

## 2018-11-05 DIAGNOSIS — M1811 Unilateral primary osteoarthritis of first carpometacarpal joint, right hand: Secondary | ICD-10-CM | POA: Diagnosis not present

## 2018-11-05 MED ORDER — GADOBENATE DIMEGLUMINE 529 MG/ML IV SOLN
20.0000 mL | Freq: Once | INTRAVENOUS | Status: AC | PRN
  Administered 2018-11-05: 20 mL via INTRAVENOUS

## 2018-11-08 ENCOUNTER — Ambulatory Visit
Admission: RE | Admit: 2018-11-08 | Discharge: 2018-11-08 | Disposition: A | Discharge status: Home / Self Care | Payer: Federal, State, Local not specified - PPO | Source: Ambulatory Visit | Attending: Family Medicine | Admitting: Family Medicine

## 2018-11-08 DIAGNOSIS — Z1231 Encounter for screening mammogram for malignant neoplasm of breast: Secondary | ICD-10-CM

## 2018-11-18 DIAGNOSIS — M25831 Other specified joint disorders, right wrist: Secondary | ICD-10-CM | POA: Diagnosis not present

## 2018-11-18 DIAGNOSIS — M1812 Unilateral primary osteoarthritis of first carpometacarpal joint, left hand: Secondary | ICD-10-CM | POA: Diagnosis not present

## 2018-11-19 DIAGNOSIS — I10 Essential (primary) hypertension: Secondary | ICD-10-CM | POA: Diagnosis not present

## 2018-11-19 DIAGNOSIS — R739 Hyperglycemia, unspecified: Secondary | ICD-10-CM | POA: Diagnosis not present

## 2018-11-26 DIAGNOSIS — M65331 Trigger finger, right middle finger: Secondary | ICD-10-CM | POA: Diagnosis not present

## 2018-12-16 DIAGNOSIS — Z630 Problems in relationship with spouse or partner: Secondary | ICD-10-CM | POA: Diagnosis not present

## 2018-12-21 DIAGNOSIS — M2560 Stiffness of unspecified joint, not elsewhere classified: Secondary | ICD-10-CM | POA: Diagnosis not present

## 2019-01-05 ENCOUNTER — Other Ambulatory Visit: Payer: Self-pay | Admitting: Obstetrics and Gynecology

## 2019-01-05 ENCOUNTER — Other Ambulatory Visit (HOSPITAL_COMMUNITY)
Admission: RE | Admit: 2019-01-05 | Discharge: 2019-01-05 | Disposition: A | Discharge status: Home / Self Care | Payer: Federal, State, Local not specified - PPO | Source: Ambulatory Visit | Attending: Obstetrics and Gynecology | Admitting: Obstetrics and Gynecology

## 2019-01-05 DIAGNOSIS — Z113 Encounter for screening for infections with a predominantly sexual mode of transmission: Secondary | ICD-10-CM | POA: Diagnosis not present

## 2019-01-05 DIAGNOSIS — Z01411 Encounter for gynecological examination (general) (routine) with abnormal findings: Secondary | ICD-10-CM | POA: Diagnosis not present

## 2019-01-05 DIAGNOSIS — Z01419 Encounter for gynecological examination (general) (routine) without abnormal findings: Secondary | ICD-10-CM | POA: Diagnosis not present

## 2019-01-07 LAB — CYTOLOGY - PAP
Diagnosis: NEGATIVE
HPV: NOT DETECTED

## 2019-01-24 ENCOUNTER — Ambulatory Visit: Payer: BLUE CROSS/BLUE SHIELD | Admitting: Podiatry

## 2019-01-25 DIAGNOSIS — M797 Fibromyalgia: Secondary | ICD-10-CM | POA: Diagnosis not present

## 2019-01-25 DIAGNOSIS — M1990 Unspecified osteoarthritis, unspecified site: Secondary | ICD-10-CM | POA: Diagnosis not present

## 2019-01-25 DIAGNOSIS — H04129 Dry eye syndrome of unspecified lacrimal gland: Secondary | ICD-10-CM | POA: Diagnosis not present

## 2019-01-25 DIAGNOSIS — M2550 Pain in unspecified joint: Secondary | ICD-10-CM | POA: Diagnosis not present

## 2019-01-26 ENCOUNTER — Ambulatory Visit (INDEPENDENT_AMBULATORY_CARE_PROVIDER_SITE_OTHER): Payer: Federal, State, Local not specified - PPO | Admitting: Podiatry

## 2019-01-26 ENCOUNTER — Ambulatory Visit (INDEPENDENT_AMBULATORY_CARE_PROVIDER_SITE_OTHER): Payer: BLUE CROSS/BLUE SHIELD

## 2019-01-26 ENCOUNTER — Other Ambulatory Visit: Payer: Self-pay

## 2019-01-26 DIAGNOSIS — M2042 Other hammer toe(s) (acquired), left foot: Secondary | ICD-10-CM

## 2019-01-26 DIAGNOSIS — Z833 Family history of diabetes mellitus: Secondary | ICD-10-CM | POA: Insufficient documentation

## 2019-01-26 DIAGNOSIS — M2041 Other hammer toe(s) (acquired), right foot: Secondary | ICD-10-CM

## 2019-01-26 DIAGNOSIS — L600 Ingrowing nail: Secondary | ICD-10-CM | POA: Diagnosis not present

## 2019-01-26 DIAGNOSIS — K449 Diaphragmatic hernia without obstruction or gangrene: Secondary | ICD-10-CM | POA: Insufficient documentation

## 2019-01-26 DIAGNOSIS — M21619 Bunion of unspecified foot: Secondary | ICD-10-CM | POA: Diagnosis not present

## 2019-01-26 DIAGNOSIS — M25562 Pain in left knee: Secondary | ICD-10-CM | POA: Insufficient documentation

## 2019-01-26 DIAGNOSIS — Z8249 Family history of ischemic heart disease and other diseases of the circulatory system: Secondary | ICD-10-CM | POA: Insufficient documentation

## 2019-01-26 DIAGNOSIS — Z823 Family history of stroke: Secondary | ICD-10-CM | POA: Insufficient documentation

## 2019-01-26 MED ORDER — NEOMYCIN-POLYMYXIN-HC 3.5-10000-1 OT SOLN: OTIC | 0 refills | Status: DC

## 2019-01-26 MED ORDER — HYDROCODONE-ACETAMINOPHEN 10-325 MG PO TABS: 1.0000 | ORAL_TABLET | Freq: Three times a day (TID) | ORAL | 0 refills | Status: DC | PRN

## 2019-01-26 NOTE — Patient Instructions (Addendum)
Bunion  A bunion is a bump on the base of the big toe that forms when the bones of the big toe joint move out of position. Bunions may be small at first, but they often get larger over time. They can make walking painful. What are the causes? A bunion may be caused by:  Wearing narrow or pointed shoes that force the big toe to press against the other toes.  Abnormal foot development that causes the foot to roll inward (pronate).  Changes in the foot that are caused by certain diseases, such as rheumatoid arthritis or polio.  A foot injury. What increases the risk? The following factors may make you more likely to develop this condition:  Wearing shoes that squeeze the toes together.  Having certain diseases, such as: ? Rheumatoid arthritis. ? Polio. ? Cerebral palsy.  Having family members who have bunions.  Being born with a foot deformity, such as flat feet or low arches.  Doing activities that put a lot of pressure on the feet, such as ballet dancing. What are the signs or symptoms? The main symptom of a bunion is a noticeable bump on the big toe. Other symptoms may include:  Pain.  Swelling around the big toe.  Redness and inflammation.  Thick or hardened skin on the big toe or between the toes.  Stiffness or loss of motion in the big toe.  Trouble with walking. How is this diagnosed? A bunion may be diagnosed based on your symptoms, medical history, and activities. You may have tests, such as:  X-rays. These allow your health care provider to check the position of the bones in your foot and look for damage to your joint. They also help your health care provider determine the severity of your bunion and the best way to treat it.  Joint aspiration. In this test, a sample of fluid is removed from the toe joint. This test may be done if you are in a lot of pain. It helps rule out diseases that cause painful swelling of the joints, such as arthritis. How is this  treated? Treatment depends on the severity of your symptoms. The goal of treatment is to relieve symptoms and prevent the bunion from getting worse. Your health care provider may recommend:  Wearing shoes that have a wide toe box.  Using bunion pads to cushion the affected area.  Taping your toes together to keep them in a normal position.  Placing a device inside your shoe (orthotics) to help reduce pressure on your toe joint.  Taking medicine to ease pain, inflammation, and swelling.  Applying heat or ice to the affected area.  Doing stretching exercises.  Surgery to remove scar tissue and move the toes back into their normal position. This treatment is rare. Follow these instructions at home: Managing pain, stiffness, and swelling   If directed, put ice on the painful area: ? Put ice in a plastic bag. ? Place a towel between your skin and the bag. ? Leave the ice on for 20 minutes, 2-3 times a day. Activity   If directed, apply heat to the affected area before you exercise. Use the heat source that your health care provider recommends, such as a moist heat pack or a heating pad. ? Place a towel between your skin and the heat source. ? Leave the heat on for 20-30 minutes. ? Remove the heat if your skin turns bright red. This is especially important if you are unable to feel pain,   heat, or cold. You may have a greater risk of getting burned.  Do exercises as told by your health care provider. General instructions  Support your toe joint with proper footwear, shoe padding, or taping as told by your health care provider.  Take over-the-counter and prescription medicines only as told by your health care provider.  Keep all follow-up visits as told by your health care provider. This is important. Contact a health care provider if your symptoms:  Get worse.  Do not improve in 2 weeks. Get help right away if you have:  Severe pain and trouble with walking. Summary  A  bunion is a bump on the base of the big toe that forms when the bones of the big toe joint move out of position.  Bunions can make walking painful.  Treatment depends on the severity of your symptoms.  Support your toe joint with proper footwear, shoe padding, or taping as told by your health care provider. This information is not intended to replace advice given to you by your health care provider. Make sure you discuss any questions you have with your health care provider. Document Released: 10/20/2005 Document Revised: 03/02/2018 Document Reviewed: 03/02/2018 Elsevier Interactive Patient Education  2019 Elsevier Inc.    Hammer Toe  Hammer toe is a change in the shape (a deformity) of your toe. The deformity causes the middle joint of your toe to stay bent. This causes pain, especially when you are wearing shoes. Hammer toe starts gradually. At first, the toe can be straightened. Gradually over time, the deformity becomes stiff and permanent. Early treatments to keep the toe straight may relieve pain. As the deformity becomes stiff and permanent, surgery may be needed to straighten the toe. What are the causes? Hammer toe is caused by abnormal bending of the toe joint that is closest to your foot. It happens gradually over time. This pulls on the muscles and connections (tendons) of the toe joint, making them weak and stiff. It is often related to wearing shoes that are too short or narrow and do not let your toes straighten. What increases the risk? You may be at greater risk for hammer toe if you:  Are female.  Are older.  Wear shoes that are too small.  Wear high-heeled shoes that pinch your toes.  Are a ballet dancer.  Have a second toe that is longer than your big toe (first toe).  Injure your foot or toe.  Have arthritis.  Have a family history of hammer toe.  Have a nerve or muscle disorder. What are the signs or symptoms? The main symptoms of this condition are  pain and deformity of the toe. The pain is worse when wearing shoes, walking, or running. Other symptoms may include:  Corns or calluses over the bent part of the toe or between the toes.  Redness and a burning feeling on the toe.  An open sore that forms on the top of the toe.  Not being able to straighten the toe. How is this diagnosed? This condition is diagnosed based on your symptoms and a physical exam. During the exam, your health care provider will try to straighten your toe to see how stiff the deformity is. You may also have tests, such as:  A blood test to check for rheumatoid arthritis.  An X-ray to show how severe the deformity is. How is this treated? Treatment for this condition will depend on how stiff the deformity is. Surgery is often needed.   However, sometimes a hammer toe can be straightened without surgery. Treatments that do not involve surgery include:  Taping the toe into a straightened position.  Using pads and cushions to protect the toe (orthotics).  Wearing shoes that provide enough room for the toes.  Doing toe-stretching exercises at home.  Taking an NSAID to reduce pain and swelling. If these treatments do not help or the toe cannot be straightened, surgery is the next option. The most common surgeries used to straighten a hammer toe include:  Arthroplasty. In this procedure, part of the joint is removed, and that allows the toe to straighten.  Fusion. In this procedure, cartilage between the two bones of the joint is taken out and the bones are fused together into one longer bone.  Implantation. In this procedure, part of the bone is removed and replaced with an implant to let the toe move again.  Flexor tendon transfer. In this procedure, the tendons that curl the toes down (flexor tendons) are repositioned. Follow these instructions at home:  Take over-the-counter and prescription medicines only as told by your health care provider.  Do toe  straightening and stretching exercises as told by your health care provider.  Keep all follow-up visits as told by your health care provider. This is important. How is this prevented?  Wear shoes that give your toes enough room and do not cause pain.  Do not wear high-heeled shoes. Contact a health care provider if:  Your pain gets worse.  Your toe becomes red or swollen.  You develop an open sore on your toe. This information is not intended to replace advice given to you by your health care provider. Make sure you discuss any questions you have with your health care provider. Document Released: 10/17/2000 Document Revised: 05/18/2017 Document Reviewed: 02/13/2016 Elsevier Interactive Patient Education  2019 Elsevier Inc.  Soak Instructions    THE DAY AFTER THE PROCEDURE  Place 1/4 cup of epsom salts in a quart of warm tap water.  Submerge your foot or feet with outer bandage intact for the initial soak; this will allow the bandage to become moist and wet for easy lift off.  Once you remove your bandage, continue to soak in the solution for 20 minutes.  This soak should be done twice a day.  Next, remove your foot or feet from solution, blot dry the affected area and cover.  You may use a band aid large enough to cover the area or use gauze and tape.  Apply other medications to the area as directed by the doctor such as polysporin neosporin.  IF YOUR SKIN BECOMES IRRITATED WHILE USING THESE INSTRUCTIONS, IT IS OKAY TO SWITCH TO  WHITE VINEGAR AND WATER. Or you may use antibacterial soap and water to keep the toe clean  Monitor for any signs/symptoms of infection. Call the office immediately if any occur or go directly to the emergency room. Call with any questions/concerns.    Long Term Care Instructions-Post Nail Surgery  You have had your ingrown toenail and root treated with a chemical.  This chemical causes a burn that will drain and ooze like a blister.  This can drain for  6-8 weeks or longer.  It is important to keep this area clean, covered, and follow the soaking instructions dispensed at the time of your surgery.  This area will eventually dry and form a scab.  Once the scab forms you no longer need to soak or apply a dressing.  If at   any time you experience an increase in pain, redness, swelling, or drainage, you should contact the office as soon as possible.   

## 2019-01-26 NOTE — Progress Notes (Signed)
Subjective:   Patient ID: Ashley [name], female   DOB: 63 y.o.   MRN: [id number]   HPI Patient presents stating that she is had a lot of damage of toenails and points to the big nail second nail third nail right and 1-3 for the left and also states she has a bunion left with previous surgery on her right foot a 10 years ago.  Patient has elongated second and third toes of both feet they are also bothersome and she wants them corrected.  Patient does not smoke and likes to be active   Review of Systems  All other systems reviewed and are negative.       Objective:  Physical Exam Vitals signs and nursing note reviewed.  Constitutional:      Appearance: She is well-developed.  Pulmonary:     Effort: Pulmonary effort is normal.  Musculoskeletal: Normal range of motion.  Skin:    General: Skin is warm.  Neurological:     Mental Status: She is alert.     Neurovascular found to be intact muscle strength was adequate range of motion within normal limits.  Patient is noted to have thickened damage to hallux nails bilateral second and third right and second and third and fourth left that are painful when pressed.  Patient has hyperostosis medial aspect first metatarsal head left that is painful and elongated second and third digits bilateral with distal keratotic lesions that are painful.  Patient has good digital perfusion well oriented x3     Assessment:  Damage nailbeds hallux bilateral second and third bilateral fourth left that are painful thick and she cannot cut with increased difficulty with shoes structural bunion left and hammertoe deformity second and third bilateral     Plan:  H&P all conditions reviewed discussed and x-rays reviewed.  Today we will get a focus on the right nails which are damaged and I did discuss permanent procedures and explained surgery to remove the nails and patient understands this and is willing to accept risk of procedures.  Today I  infiltrated with 180 mg like Marcaine mixture sterile prep applied to the 3 toenails and patient signed consent form understanding risk and I then went ahead and using sterile instrumentation remove the hallux second and third nails right exposed matrix and applied phenol 5 applications to each nail bed sterile dressings and instructed on soaks.  Discussed correction of the left which we will do in 4 weeks and also discussed bunion correction left and hammertoe repair second and third digits bilateral which patient wants done and will get done sometime in the summer.  Encouraged to call with any questions concerns and did write prescription for drops  X-ray indicates that there is structural bunion deformity left with elongated second and third digits bilateral

## 2019-02-04 DIAGNOSIS — R894 Abnormal immunological findings in specimens from other organs, systems and tissues: Secondary | ICD-10-CM | POA: Diagnosis not present

## 2019-02-04 DIAGNOSIS — J302 Other seasonal allergic rhinitis: Secondary | ICD-10-CM | POA: Diagnosis not present

## 2019-02-07 DIAGNOSIS — L603 Nail dystrophy: Secondary | ICD-10-CM | POA: Diagnosis not present

## 2019-02-07 DIAGNOSIS — L814 Other melanin hyperpigmentation: Secondary | ICD-10-CM | POA: Diagnosis not present

## 2019-02-07 DIAGNOSIS — D485 Neoplasm of uncertain behavior of skin: Secondary | ICD-10-CM | POA: Diagnosis not present

## 2019-02-07 DIAGNOSIS — B078 Other viral warts: Secondary | ICD-10-CM | POA: Diagnosis not present

## 2019-02-07 DIAGNOSIS — L819 Disorder of pigmentation, unspecified: Secondary | ICD-10-CM | POA: Diagnosis not present

## 2019-02-09 ENCOUNTER — Other Ambulatory Visit: Payer: Self-pay

## 2019-02-09 ENCOUNTER — Encounter: Payer: Self-pay | Admitting: Podiatry

## 2019-02-09 ENCOUNTER — Ambulatory Visit: Payer: Federal, State, Local not specified - PPO | Admitting: Podiatry

## 2019-02-09 VITALS — Temp 97.7°F

## 2019-02-09 DIAGNOSIS — L600 Ingrowing nail: Secondary | ICD-10-CM | POA: Diagnosis not present

## 2019-02-09 NOTE — Progress Notes (Signed)
Subjective:   Patient ID: Knox Royalty, female   DOB: 63 y.o.   MRN: [id number]   HPI Patient presents stating she needs the nails on her left foot removed like the right and the right foot healing very well.  Also knows long-term she is can need foot surgery but were focusing on the nails first.   ROS      Objective:  Physical Exam  Neurovascular status intact with patient noted to have severely incurvated thickened nails 1234 left that are painful and she cannot cut and cannot wear shoe gear with.  The right nails are healing well with minimal drainage normal for this.  Postop     Assessment:  Chronic nail disease 1234 left with the right one is doing well     Plan:  H&P conditions reviewed and recommended nail removal allowing her to read consent form going over risk.  Today I infiltrated with 240 mg Xylocaine Marcaine mixture and after the toes are exsanguinated sterile prep applied to the digits and using sterile instrumentation I remove the hallux second third and fourth nails left I exposed the matrix and I applied phenol 5 applications to the hallux and for applications to digits 234 followed by alcohol lavage and sterile dressing.  Gave instructions on soaks and advised on leaving dressings on 24 hours but to take them off earlier if needed and patient will be seen back to recheck and also discussed ultimate surgical intervention of bunion and hammertoe deformities

## 2019-02-09 NOTE — Patient Instructions (Signed)

## 2019-02-23 ENCOUNTER — Other Ambulatory Visit: Payer: Self-pay

## 2019-02-23 ENCOUNTER — Ambulatory Visit (INDEPENDENT_AMBULATORY_CARE_PROVIDER_SITE_OTHER): Payer: Federal, State, Local not specified - PPO | Admitting: Podiatry

## 2019-02-23 ENCOUNTER — Encounter: Payer: Self-pay | Admitting: Podiatry

## 2019-02-23 VITALS — Temp 97.7°F

## 2019-02-23 DIAGNOSIS — M2042 Other hammer toe(s) (acquired), left foot: Secondary | ICD-10-CM

## 2019-02-23 DIAGNOSIS — R682 Dry mouth, unspecified: Secondary | ICD-10-CM | POA: Diagnosis not present

## 2019-02-23 DIAGNOSIS — J342 Deviated nasal septum: Secondary | ICD-10-CM | POA: Diagnosis not present

## 2019-02-23 DIAGNOSIS — L600 Ingrowing nail: Secondary | ICD-10-CM | POA: Diagnosis not present

## 2019-02-23 DIAGNOSIS — J302 Other seasonal allergic rhinitis: Secondary | ICD-10-CM | POA: Diagnosis not present

## 2019-02-23 DIAGNOSIS — M2041 Other hammer toe(s) (acquired), right foot: Secondary | ICD-10-CM | POA: Diagnosis not present

## 2019-02-23 DIAGNOSIS — M21619 Bunion of unspecified foot: Secondary | ICD-10-CM

## 2019-02-23 DIAGNOSIS — K117 Disturbances of salivary secretion: Secondary | ICD-10-CM | POA: Insufficient documentation

## 2019-02-23 DIAGNOSIS — H04123 Dry eye syndrome of bilateral lacrimal glands: Secondary | ICD-10-CM | POA: Insufficient documentation

## 2019-02-23 NOTE — Progress Notes (Signed)
Subjective:   Patient ID: Ashley [name], female   DOB: 63 y.o.   MRN: [id number]   HPI Patient presents stating that the toe seems to burn and she develops calluses the end of the second and third toes and knows that she is getting a digital surgery but we are waiting for the nails to heal first   ROS      Objective:  Physical Exam  Neurovascular status intact muscle strength is adequate with patient found to have elongated second and third toes bilateral with distal keratotic lesions do get painful and is noted to have abnormal length pattern.  Also is noted to have well-healing nail site with crusted tissue and no active drainage noted     Assessment:  Overall doing well with digital deformities which will need correction and well-healed from nail surgery     Plan:  H&P reviewed condition and we will see her back 6 weeks and I debrided some distal tissue.  I then instructed on continued soaks and patient will be seen back to recheck and we will discuss surgical intervention with shortening of the digit at that time

## 2019-02-28 DIAGNOSIS — M419 Scoliosis, unspecified: Secondary | ICD-10-CM | POA: Diagnosis not present

## 2019-02-28 DIAGNOSIS — M5136 Other intervertebral disc degeneration, lumbar region: Secondary | ICD-10-CM | POA: Diagnosis not present

## 2019-02-28 DIAGNOSIS — M4726 Other spondylosis with radiculopathy, lumbar region: Secondary | ICD-10-CM | POA: Diagnosis not present

## 2019-02-28 DIAGNOSIS — M546 Pain in thoracic spine: Secondary | ICD-10-CM | POA: Diagnosis not present

## 2019-04-06 ENCOUNTER — Other Ambulatory Visit: Payer: Self-pay

## 2019-04-06 ENCOUNTER — Ambulatory Visit: Payer: Federal, State, Local not specified - PPO | Admitting: Podiatry

## 2019-04-06 VITALS — Temp 96.8°F

## 2019-04-06 DIAGNOSIS — M21619 Bunion of unspecified foot: Secondary | ICD-10-CM

## 2019-04-06 DIAGNOSIS — L600 Ingrowing nail: Secondary | ICD-10-CM

## 2019-04-06 DIAGNOSIS — M21611 Bunion of right foot: Secondary | ICD-10-CM

## 2019-04-06 DIAGNOSIS — M2041 Other hammer toe(s) (acquired), right foot: Secondary | ICD-10-CM | POA: Diagnosis not present

## 2019-04-06 DIAGNOSIS — M2042 Other hammer toe(s) (acquired), left foot: Secondary | ICD-10-CM | POA: Diagnosis not present

## 2019-04-06 MED ORDER — CEPHALEXIN 500 MG PO CAPS: 500.0000 mg | ORAL_CAPSULE | Freq: Three times a day (TID) | ORAL | 1 refills | Status: DC

## 2019-04-06 NOTE — Patient Instructions (Signed)
Pre-Operative Instructions  Congratulations, you have decided to take an important step towards improving your quality of life.  You can be assured that the doctors and staff at [facility] will be with you every step of the way.  Here are some important things you should know:  1. Plan to be at the surgery center/hospital at least 1 (one) hour prior to your scheduled time, unless otherwise directed by the surgical center/hospital staff.  You must have a responsible adult accompany you, remain during the surgery and drive you home.  Make sure you have directions to the surgical center/hospital to ensure you arrive on time. 2. If you are having surgery at [facility] or [facility], you will need a copy of your medical history and physical form from your family physician within one month prior to the date of surgery. We will give you a form for your primary physician to complete.  3. We make every effort to accommodate the date you request for surgery.  However, there are times where surgery dates or times have to be moved.  We will contact you as soon as possible if a change in schedule is required.   4. No aspirin/ibuprofen for one week before surgery.  If you are on aspirin, any non-steroidal anti-inflammatory medications (Mobic, Aleve, Ibuprofen) should not be taken seven (7) days prior to your surgery.  You make take Tylenol for pain prior to surgery.  5. Medications - If you are taking daily heart and blood pressure medications, seizure, reflux, allergy, asthma, anxiety, pain or diabetes medications, make sure you notify the surgery center/hospital before the day of surgery so they can tell you which medications you should take or avoid the day of surgery. 6. No food or drink after midnight the night before surgery unless directed otherwise by surgical center/hospital staff. 7. No alcoholic beverages 24-hours prior to surgery.  No smoking 24-hours prior or 24-hours after  surgery. 8. Wear loose pants or shorts. They should be loose enough to fit over bandages, boots, and casts. 9. Don't wear slip-on shoes. Sneakers are preferred. 10. Bring your boot with you to the surgery center/hospital.  Also bring crutches or a walker if your physician has prescribed it for you.  If you do not have this equipment, it will be provided for you after surgery. 11. If you have not been contacted by the surgery center/hospital by the day before your surgery, call to confirm the date and time of your surgery. 12. Leave-time from work may vary depending on the type of surgery you have.  Appropriate arrangements should be made prior to surgery with your employer. 13. Prescriptions will be provided immediately following surgery by your doctor.  Fill these as soon as possible after surgery and take the medication as directed. Pain medications will not be refilled on weekends and must be approved by the doctor. 14. Remove nail polish on the operative foot and avoid getting pedicures prior to surgery. 15. Wash the night before surgery.  The night before surgery wash the foot and leg well with water and the antibacterial soap provided. Be sure to pay special attention to beneath the toenails and in between the toes.  Wash for at least three (3) minutes. Rinse thoroughly with water and dry well with a towel.  Perform this wash unless told not to do so by your physician.  Enclosed: 1 Ice pack (please put in freezer the night before surgery)   1 Hibiclens skin cleaner     Pre-op instructions  If you have any questions regarding the instructions, please do not hesitate to call our office.  [place]: [address], [place], [place] [address] -- [phone]  [place]: [address] [place], [place] [address] -- [phone]  [place]: [address]  [place], [place] [address] -- [phone]  [place]: [address], [place], [place] [address] -- [phone]  Website:  [email or web address] 

## 2019-04-06 NOTE — Progress Notes (Signed)
Subjective:   Patient ID: Ashley [name], female   DOB: 63 y.o.   MRN: [id number]   HPI Patient presents stating she is getting some irritation in the second nail left and she wants to get these toes shortened right over left foot and wants to do 1 foot at a time and also does have gout bunion deformity left that at times is bothersome   ROS      Objective:  Physical Exam  Neurovascular status intact with patient's left second nail showing a very localized drainage of the proximal lateral portion with no proximal edema erythema or drainage noted.  She has significant elongation digits 234 left digits 234 righ with the fourth right being distal with the other toes being more of the length issue.  Patient has good digital perfusion well oriented x3    Assessment:  Slight nail healing of the second digit left proximal lateral portion localized along with digital deformity digits 234 bilateral     Plan:  H&P all conditions reviewed and I have recommended digital fusion of digits 2 3 right for shortening and distal arthritis digit for right.  I then discussed correction of the left along with bunion correction.  I reviewed all findings with Ashley [name] and allow her to read consent form going over alternative treatments complications and after extensive review she signed understanding risk and the fact the toes will not bend at the inner phalangeal joint.  I gave instructions for the postoperative period and did dispense air fracture walker as I think it will help to prevent stress on the digits and hopefully allow better healing pattern and I did review a total recovery around 6 months.  Patient signed consent form scheduled for outpatient surgery and for the left as precautionary measure I did place her on cephalexin 500 mg 3 times daily and gave instructions for soaks and reappoint for [place] to recheck it that should give her any issues

## 2019-04-11 ENCOUNTER — Telehealth: Payer: Self-pay | Admitting: *Deleted

## 2019-04-11 NOTE — Telephone Encounter (Signed)
"  I am calling you back.  It looks like you called me while I was at lunch.  You're probably at lunch now.  Give me a call back., I'll be around."

## 2019-04-11 NOTE — Telephone Encounter (Signed)
From: Leo [email or web address]  Sent: Monday, April 11, 2019 9:36 AM To: Santiago [email or web address]> Subject: Regal TOM Francene Finders Sex: Female, Date of Birth: 1956-04-30, Age: 63 [place] [address]) Medical Record # [id number] Procedure: FUSION WITH PIN DIGITS 2,3 RIGHT FOOT, DISTAL ARTHROPLASTY DIGIT 4 RIGHT FOOT on 04/12/2019 Physician: Wallene Huh  [address] [place], [place] [address] Mobile: [phone]  Patient had a death in the family. Cancelling for tomorrow. Will reschedule with office.   ------------------------------------------------------------------------------------------------------------------------------------------------------------ I left the patient a message to return my call.  I gave her my condolences for the death in her family.  I asked her to call me back at her convenience.   "I see where I missed your call."  Yes, I left you a message.  I was calling to see if you would like to reschedule your surgery.  "Yes, I would.  I want to do it as soon as possible.  I hate I have to reschedule it but my mother-in-law passed away.  The funeral is tomorrow."  Dr. Paulla Dolly can do it next week if you would like that date.  "That will be fine.  What time will it be?"  It'll be sometime that morning but I can't give you an exact time.  Someone from the surgical center will give you a call the Friday or Monday before your surgery date and that person will give you your arrival time.  "Okay, thank you so much."  I give my condolences to you and your family.  I rescheduled her surgery from 04/12/2019 to 04/19/2019 via the surgical center's [facility] [employer].

## 2019-04-11 NOTE — Telephone Encounter (Addendum)
DOS 04/19/2019, CPT CODE: 15520 X 3 - HAMMER TOE REPAIR 2,3 WITH PIN, 4TH DISTAL RIGHT FOOT  BCBS: 11/09/2016  Co-insurance: 70% / 30% Co-pay - $200 Out of pocket - $5500 Ref #[id number] Richardson Landry)   PRE-CERT NOT REQUIRED - PER JAREN   The reference # for the call is [id number].

## 2019-04-13 DIAGNOSIS — N952 Postmenopausal atrophic vaginitis: Secondary | ICD-10-CM | POA: Diagnosis not present

## 2019-04-13 DIAGNOSIS — N930 Postcoital and contact bleeding: Secondary | ICD-10-CM | POA: Diagnosis not present

## 2019-04-13 NOTE — Telephone Encounter (Signed)
Make sure to call her next week

## 2019-04-18 DIAGNOSIS — M2041 Other hammer toe(s) (acquired), right foot: Secondary | ICD-10-CM | POA: Diagnosis not present

## 2019-04-19 ENCOUNTER — Encounter: Payer: Self-pay | Admitting: Podiatry

## 2019-04-19 DIAGNOSIS — M2041 Other hammer toe(s) (acquired), right foot: Secondary | ICD-10-CM | POA: Diagnosis not present

## 2019-04-19 DIAGNOSIS — I10 Essential (primary) hypertension: Secondary | ICD-10-CM | POA: Diagnosis not present

## 2019-04-21 ENCOUNTER — Telehealth: Payer: Self-pay | Admitting: Podiatry

## 2019-04-21 ENCOUNTER — Encounter: Payer: Self-pay | Admitting: *Deleted

## 2019-04-21 ENCOUNTER — Encounter: Payer: Self-pay | Admitting: Podiatry

## 2019-04-21 NOTE — Telephone Encounter (Signed)
Patient needs a work note, She had surgery on 6/16 and needs a note for 6/16 and 6/17. She is working from home and is keeping her foot elevated. She would like to have the note emailed to .    [email or web address]

## 2019-04-21 NOTE — Telephone Encounter (Signed)
Emailed note to pt's requested address.

## 2019-04-22 ENCOUNTER — Encounter: Payer: Self-pay | Admitting: Podiatry

## 2019-04-25 DIAGNOSIS — D110 Benign neoplasm of parotid gland: Secondary | ICD-10-CM | POA: Diagnosis not present

## 2019-04-25 DIAGNOSIS — R682 Dry mouth, unspecified: Secondary | ICD-10-CM | POA: Diagnosis not present

## 2019-04-25 DIAGNOSIS — K117 Disturbances of salivary secretion: Secondary | ICD-10-CM | POA: Diagnosis not present

## 2019-04-26 ENCOUNTER — Other Ambulatory Visit: Payer: Self-pay | Admitting: Emergency Medicine

## 2019-04-27 ENCOUNTER — Other Ambulatory Visit: Payer: Self-pay

## 2019-04-27 ENCOUNTER — Ambulatory Visit (INDEPENDENT_AMBULATORY_CARE_PROVIDER_SITE_OTHER): Payer: Federal, State, Local not specified - PPO

## 2019-04-27 ENCOUNTER — Ambulatory Visit (INDEPENDENT_AMBULATORY_CARE_PROVIDER_SITE_OTHER): Payer: Federal, State, Local not specified - PPO | Admitting: Podiatry

## 2019-04-27 ENCOUNTER — Encounter: Payer: Self-pay | Admitting: Podiatry

## 2019-04-27 VITALS — Temp 98.1°F

## 2019-04-27 DIAGNOSIS — M65331 Trigger finger, right middle finger: Secondary | ICD-10-CM | POA: Diagnosis not present

## 2019-04-27 DIAGNOSIS — Z09 Encounter for follow-up examination after completed treatment for conditions other than malignant neoplasm: Secondary | ICD-10-CM

## 2019-04-27 DIAGNOSIS — M797 Fibromyalgia: Secondary | ICD-10-CM | POA: Diagnosis not present

## 2019-04-27 DIAGNOSIS — M2042 Other hammer toe(s) (acquired), left foot: Secondary | ICD-10-CM

## 2019-04-27 DIAGNOSIS — M6588 Other synovitis and tenosynovitis, other site: Secondary | ICD-10-CM | POA: Diagnosis not present

## 2019-04-27 DIAGNOSIS — M25641 Stiffness of right hand, not elsewhere classified: Secondary | ICD-10-CM | POA: Insufficient documentation

## 2019-04-27 DIAGNOSIS — M2041 Other hammer toe(s) (acquired), right foot: Secondary | ICD-10-CM

## 2019-04-27 DIAGNOSIS — M1990 Unspecified osteoarthritis, unspecified site: Secondary | ICD-10-CM | POA: Diagnosis not present

## 2019-04-27 DIAGNOSIS — M1812 Unilateral primary osteoarthritis of first carpometacarpal joint, left hand: Secondary | ICD-10-CM | POA: Diagnosis not present

## 2019-04-27 DIAGNOSIS — H04129 Dry eye syndrome of unspecified lacrimal gland: Secondary | ICD-10-CM | POA: Diagnosis not present

## 2019-04-27 DIAGNOSIS — M2550 Pain in unspecified joint: Secondary | ICD-10-CM | POA: Diagnosis not present

## 2019-05-01 NOTE — Progress Notes (Signed)
Subjective:   Patient ID: Ashley [name], female   DOB: 63 y.o.   MRN: [id number]   HPI Patient states doing a lot better with her right foot with minimal discomfort and states everything seems to be doing well   ROS      Objective:  Physical Exam  Neurovascular status intact with patient's right second third fourth digit is doing well in good alignment pins in place fixation satisfactory     Assessment:  Doing well post digital fusion digits 2 3 right distal arthroplasty fourth right     Plan:  H&P x-ray reviewed sterile dressing reapplied did discuss the other foot which will hopefully do in the next 4 weeks and patient was advised on continued elevation compression immobilization  X-rays indicate that the fixation is in place good alignment noted signed visit

## 2019-05-09 ENCOUNTER — Ambulatory Visit: Payer: Federal, State, Local not specified - PPO

## 2019-05-09 ENCOUNTER — Encounter: Payer: Self-pay | Admitting: Podiatry

## 2019-05-09 ENCOUNTER — Other Ambulatory Visit: Payer: Self-pay

## 2019-05-09 ENCOUNTER — Ambulatory Visit (INDEPENDENT_AMBULATORY_CARE_PROVIDER_SITE_OTHER): Payer: Federal, State, Local not specified - PPO | Admitting: Podiatry

## 2019-05-09 VITALS — Temp 97.7°F

## 2019-05-09 DIAGNOSIS — M2041 Other hammer toe(s) (acquired), right foot: Secondary | ICD-10-CM

## 2019-05-09 DIAGNOSIS — M2042 Other hammer toe(s) (acquired), left foot: Secondary | ICD-10-CM

## 2019-05-09 DIAGNOSIS — M21619 Bunion of unspecified foot: Secondary | ICD-10-CM

## 2019-05-09 NOTE — Progress Notes (Signed)
Subjective:   Patient ID: Ashley [name], female   DOB: 63 y.o.   MRN: [id number]   HPI Patient states doing very well with the right foot and is ready to get the toe shortened on the left as long as bunion correction also that needs to be done   ROS      Objective:  Physical Exam  Neurovascular status intact negative Homans sign intact with patient noted to have pins intact second and third toes with adequate shortening good alignment of the digits in good alignment of the fourth toe.  Left foot shows elongated second and third digits distal rotation digit for left and structural bunion with pain first metatarsal left     Assessment:  Healing well from right foot with good structural alignment stitches intact left foot shows bunion deformity and digital deformities digits 2 3 and 4     Plan:  H&P x-rays of right reviewed stitches removed wound edges well coapted and reappoint 2 weeks to surgical center for correction of left foot and removal of pins right.  Allowed her to read consent form going over all possible complications associated with this and reviewing with her everything associated with surgery.  Patient is willing to accept risk scheduled for surgery left 2 weeks and will have the pins taken out right and all questions answered today  X-rays indicate good alignment second and third toes right with good positional component pins intact

## 2019-05-09 NOTE — Patient Instructions (Signed)
Pre-Operative Instructions  Congratulations, you have decided to take an important step towards improving your quality of life.  You can be assured that the doctors and staff at [facility] will be with you every step of the way.  Here are some important things you should know:  1. Plan to be at the surgery center/hospital at least 1 (one) hour prior to your scheduled time, unless otherwise directed by the surgical center/hospital staff.  You must have a responsible adult accompany you, remain during the surgery and drive you home.  Make sure you have directions to the surgical center/hospital to ensure you arrive on time. 2. If you are having surgery at [facility] or [facility], you will need a copy of your medical history and physical form from your family physician within one month prior to the date of surgery. We will give you a form for your primary physician to complete.  3. We make every effort to accommodate the date you request for surgery.  However, there are times where surgery dates or times have to be moved.  We will contact you as soon as possible if a change in schedule is required.   4. No aspirin/ibuprofen for one week before surgery.  If you are on aspirin, any non-steroidal anti-inflammatory medications (Mobic, Aleve, Ibuprofen) should not be taken seven (7) days prior to your surgery.  You make take Tylenol for pain prior to surgery.  5. Medications - If you are taking daily heart and blood pressure medications, seizure, reflux, allergy, asthma, anxiety, pain or diabetes medications, make sure you notify the surgery center/hospital before the day of surgery so they can tell you which medications you should take or avoid the day of surgery. 6. No food or drink after midnight the night before surgery unless directed otherwise by surgical center/hospital staff. 7. No alcoholic beverages 24-hours prior to surgery.  No smoking 24-hours prior or 24-hours after  surgery. 8. Wear loose pants or shorts. They should be loose enough to fit over bandages, boots, and casts. 9. Don't wear slip-on shoes. Sneakers are preferred. 10. Bring your boot with you to the surgery center/hospital.  Also bring crutches or a walker if your physician has prescribed it for you.  If you do not have this equipment, it will be provided for you after surgery. 11. If you have not been contacted by the surgery center/hospital by the day before your surgery, call to confirm the date and time of your surgery. 12. Leave-time from work may vary depending on the type of surgery you have.  Appropriate arrangements should be made prior to surgery with your employer. 13. Prescriptions will be provided immediately following surgery by your doctor.  Fill these as soon as possible after surgery and take the medication as directed. Pain medications will not be refilled on weekends and must be approved by the doctor. 14. Remove nail polish on the operative foot and avoid getting pedicures prior to surgery. 15. Wash the night before surgery.  The night before surgery wash the foot and leg well with water and the antibacterial soap provided. Be sure to pay special attention to beneath the toenails and in between the toes.  Wash for at least three (3) minutes. Rinse thoroughly with water and dry well with a towel.  Perform this wash unless told not to do so by your physician.  Enclosed: 1 Ice pack (please put in freezer the night before surgery)   1 Hibiclens skin cleaner     Pre-op instructions  If you have any questions regarding the instructions, please do not hesitate to call our office.  [place]: [address], , [place] [address] -- [phone]  [place]: [address] [place], [place] [address] -- [phone]  [place]: [address]  [place], [place] [address] -- [phone]  [place]: [address], [place], [place] [address] -- [phone]  Website:  [email or web address] 

## 2019-05-09 NOTE — Addendum Note (Signed)
Addended by: Dorris Singh on: 05/09/2019 02:29 PM   Modules accepted: Orders

## 2019-05-13 ENCOUNTER — Telehealth: Payer: Self-pay | Admitting: *Deleted

## 2019-05-13 NOTE — Telephone Encounter (Signed)
DOS 05/24/2019; 75449 - Ashley [name],  20100 - HAMMER TOE REPAIR 2ND AND 3RD W/ PIN, 4TH DISTAL LT FOOT  BCBS: 11/09/2016  Co-insurance: 70% / 30% Co-pay - $200 Out of pocket - $5500

## 2019-05-16 DIAGNOSIS — M65331 Trigger finger, right middle finger: Secondary | ICD-10-CM | POA: Diagnosis not present

## 2019-05-16 DIAGNOSIS — M25641 Stiffness of right hand, not elsewhere classified: Secondary | ICD-10-CM | POA: Diagnosis not present

## 2019-05-16 DIAGNOSIS — M79644 Pain in right finger(s): Secondary | ICD-10-CM | POA: Diagnosis not present

## 2019-05-23 DIAGNOSIS — M65331 Trigger finger, right middle finger: Secondary | ICD-10-CM | POA: Diagnosis not present

## 2019-05-23 DIAGNOSIS — M25641 Stiffness of right hand, not elsewhere classified: Secondary | ICD-10-CM | POA: Diagnosis not present

## 2019-05-23 DIAGNOSIS — M79644 Pain in right finger(s): Secondary | ICD-10-CM | POA: Diagnosis not present

## 2019-05-24 ENCOUNTER — Encounter: Payer: Self-pay | Admitting: Podiatry

## 2019-05-24 DIAGNOSIS — I10 Essential (primary) hypertension: Secondary | ICD-10-CM | POA: Diagnosis not present

## 2019-05-24 DIAGNOSIS — M2012 Hallux valgus (acquired), left foot: Secondary | ICD-10-CM | POA: Diagnosis not present

## 2019-05-24 DIAGNOSIS — M2042 Other hammer toe(s) (acquired), left foot: Secondary | ICD-10-CM | POA: Diagnosis not present

## 2019-06-01 ENCOUNTER — Other Ambulatory Visit: Payer: Self-pay

## 2019-06-01 ENCOUNTER — Encounter: Payer: Self-pay | Admitting: Podiatry

## 2019-06-01 ENCOUNTER — Ambulatory Visit: Payer: Federal, State, Local not specified - PPO | Admitting: Podiatry

## 2019-06-01 ENCOUNTER — Ambulatory Visit (INDEPENDENT_AMBULATORY_CARE_PROVIDER_SITE_OTHER): Payer: Federal, State, Local not specified - PPO

## 2019-06-01 VITALS — Temp 98.0°F

## 2019-06-01 DIAGNOSIS — M2042 Other hammer toe(s) (acquired), left foot: Secondary | ICD-10-CM

## 2019-06-01 DIAGNOSIS — M2041 Other hammer toe(s) (acquired), right foot: Secondary | ICD-10-CM

## 2019-06-01 DIAGNOSIS — Z09 Encounter for follow-up examination after completed treatment for conditions other than malignant neoplasm: Secondary | ICD-10-CM

## 2019-06-02 NOTE — Progress Notes (Signed)
Subjective:   Patient ID: Ashley [name], female   DOB: 63 y.o.   MRN: [id number]   HPI Patient states doing really well with left foot and my right foot continues to improve and I am wearing a regular shoe   ROS      Objective:  Physical Exam  Neurovascular status intact negative Homans sign noted with patient's left foot doing well postoperatively with pins intact wound edges all well coapted good alignment noted in structure with the right foot also doing very well with no pathology     Assessment:  Doing well post forefoot reconstruction left right foot     Plan:  H&P discussed conditions and recommended continued immobilization elevation compression for the left foot and continued shoe gear usage right foot and reapplied sterile dressing and reviewed x-rays.  Reappoint to recheck again in approximate 2 weeks  X-rays indicate osteotomies healing well with good alignment with fixation in place and digits in good alignment with fixation in place

## 2019-06-03 ENCOUNTER — Encounter: Payer: Self-pay | Admitting: *Deleted

## 2019-06-03 ENCOUNTER — Telehealth: Payer: Self-pay | Admitting: Podiatry

## 2019-06-03 NOTE — Telephone Encounter (Signed)
Pt is requesting two Dr. Notes. One for when she was out of work from 05/24/2019 through 05/27/2019. She was in this Wednesday and needed one for being in the office to be seem. Please call patient.

## 2019-06-03 NOTE — Telephone Encounter (Signed)
I called pt and she states needs one note to states she was out of work due to surgery on 05/24/2019 through 05/27/2019, and the second note to states she was seen in our office Wednesday 06/01/2019, fax to [phone].

## 2019-06-14 ENCOUNTER — Other Ambulatory Visit: Payer: Self-pay

## 2019-06-14 ENCOUNTER — Ambulatory Visit (INDEPENDENT_AMBULATORY_CARE_PROVIDER_SITE_OTHER): Payer: Federal, State, Local not specified - PPO

## 2019-06-14 ENCOUNTER — Ambulatory Visit (INDEPENDENT_AMBULATORY_CARE_PROVIDER_SITE_OTHER): Payer: Self-pay | Admitting: Sports Medicine

## 2019-06-14 ENCOUNTER — Encounter: Payer: Self-pay | Admitting: Sports Medicine

## 2019-06-14 VITALS — Temp 96.6°F

## 2019-06-14 DIAGNOSIS — M2042 Other hammer toe(s) (acquired), left foot: Secondary | ICD-10-CM

## 2019-06-14 DIAGNOSIS — M2041 Other hammer toe(s) (acquired), right foot: Secondary | ICD-10-CM

## 2019-06-14 DIAGNOSIS — M21619 Bunion of unspecified foot: Secondary | ICD-10-CM

## 2019-06-14 DIAGNOSIS — Z09 Encounter for follow-up examination after completed treatment for conditions other than malignant neoplasm: Secondary | ICD-10-CM

## 2019-06-14 DIAGNOSIS — M79672 Pain in left foot: Secondary | ICD-10-CM

## 2019-06-14 DIAGNOSIS — M7989 Other specified soft tissue disorders: Secondary | ICD-10-CM

## 2019-06-14 DIAGNOSIS — M79675 Pain in left toe(s): Secondary | ICD-10-CM

## 2019-06-14 MED ORDER — AMOXICILLIN-POT CLAVULANATE 875-125 MG PO TABS: 1.0000 | ORAL_TABLET | Freq: Two times a day (BID) | ORAL | 0 refills | Status: DC

## 2019-06-14 NOTE — Progress Notes (Signed)
Subjective: Knox RoyaltyValerie Kass Jones-Robinson is a 63 y.o. female patient seen today in office for POV, DOS 05-24-19, S/P Left forefoot reconstruction with Dr. Charlsie Merlesegal. Patient admits pain at surgical site with some swelling and redness and reports that she has been alternating between her post op shoe and boot, denies calf pain, denies headache, chest pain, shortness of breath, nausea, vomiting, fever, or chills. No other issues noted.   Admits that her husband just passed away suddenly and his funeral is tomorrow.   Patient Active Problem List   Diagnosis Date Noted  . Trigger middle finger of right hand 04/27/2019  . Stiffness of finger joint, right 04/27/2019  . Xerostomia 02/23/2019  . Dry eyes 02/23/2019  . Family history of diabetes mellitus 01/26/2019  . Family history of ischemic heart disease (IHD) 01/26/2019  . Family history of stroke 01/26/2019  . Hiatal hernia 01/26/2019  . Knee pain, left 01/26/2019  . Sacroiliac joint dysfunction 08/05/2018  . Spondylosis of lumbar region without myelopathy or radiculopathy 05/16/2016  . Generalized headaches 04/21/2016  . History of migraine 04/21/2016  . Menopause 04/09/2016  . Benign hypertension 04/09/2016  . Depressive disorder 04/09/2016  . Disorder of thyroid gland 04/09/2016  . Dysphagia 04/09/2016  . Chronic low back pain with left-sided sciatica 03/24/2016  . Intrinsic asthma 09/06/2014  . GERD (gastroesophageal reflux disease) 09/06/2014  . Allergic rhinitis 09/06/2014  . Lumbar radicular pain 09/04/2014  . Fibromyalgia 09/04/2014  . Back pain 06/01/2013  . Cubital tunnel syndrome 06/01/2013  . Muscle spasms of neck 06/01/2013  . Myofascial pain 06/01/2013  . Neuropathic pain 06/01/2013  . Right wrist pain 06/01/2013  . Shoulder pain 06/01/2013  . Primary localized osteoarthrosis, lower leg 12/08/2012  . Encounter for routine gynecological examination 11/16/2012  . Borderline hypertension 06/16/2012  . Schatzki's ring  05/21/2012  . G6PD deficiency 04/14/2012  . Lumbar post-laminectomy syndrome 08/26/2011  . SOB (shortness of breath) 04/27/2009    Current Outpatient Medications on File Prior to Visit  Medication Sig Dispense Refill  . acetaminophen-codeine (TYLENOL #3) 300-30 MG tablet Take 1 tablet by mouth every 6 (six) hours as needed (for pain).     Marland Kitchen. albuterol (PROVENTIL HFA;VENTOLIN HFA) 108 (90 Base) MCG/ACT inhaler Inhale 1-2 puffs into the lungs every 6 (six) hours as needed for wheezing or shortness of breath. (Patient taking differently: Inhale 2 puffs into the lungs every 4 (four) hours as needed for wheezing or shortness of breath. ) 16 g 6  . albuterol (PROVENTIL) (2.5 MG/3ML) 0.083% nebulizer solution USE 1 VIAL VIA NEBULIZER EVERY 4 HOURS AS NEEDED FOR WHEEZING OR SHORTNESS OF BREATH (Patient taking differently: Take 2.5 mg by nebulization every 6 (six) hours as needed for wheezing or shortness of breath. ) 150 mL 2  . atorvastatin (LIPITOR) 20 MG tablet     . azelastine (OPTIVAR) 0.05 % ophthalmic solution Place 1 drop into the left eye 2 (two) times daily.    . baclofen (LIORESAL) 20 MG tablet Take 1 tablet (20 mg total) by mouth 3 (three) times daily. 15 each 0  . beclomethasone (QVAR REDIHALER) 80 MCG/ACT inhaler Inhale 2 puffs into the lungs 2 (two) times daily. 1 Inhaler 6  . benzonatate (TESSALON) 100 MG capsule Take 100 mg by mouth 3 (three) times daily as needed for cough.    . budesonide-formoterol (SYMBICORT) 160-4.5 MCG/ACT inhaler Inhale 2 puffs into the lungs 2 (two) times daily. 1 Inhaler 5  . cephALEXin (KEFLEX) 500 MG capsule Take 1 capsule (  500 mg total) by mouth 3 (three) times daily. 21 capsule 1  . desloratadine (CLARINEX) 5 MG tablet Take 5 mg by mouth daily as needed (allergies).     . diazepam (VALIUM) 5 MG tablet as needed.    . DULoxetine (CYMBALTA) 60 MG capsule Take 60 mg by mouth daily.     . fexofenadine (ALLEGRA) 180 MG tablet Take by mouth.    . fluticasone  (FLONASE) 50 MCG/ACT nasal spray Place 2 sprays into both nostrils 2 (two) times daily. 48 g 3  . gabapentin (NEURONTIN) 800 MG tablet Take 1 tablet (800 mg total) by mouth 3 (three) times daily. 90 tablet 5  . hydrochlorothiazide (HYDRODIURIL) 12.5 MG tablet Take 12.5 mg by mouth 2 (two) times daily.  1  . HYDROcodone-acetaminophen (NORCO) 10-325 MG tablet Take 1 tablet by mouth every 8 (eight) hours as needed. 30 tablet 0  . LORazepam (ATIVAN) 1 MG tablet Take 0.5-1 mg by mouth 2 (two) times daily as needed (stress/elevated blood pressure).    Marland Kitchen losartan (COZAAR) 25 MG tablet Take 25 mg by mouth 2 (two) times daily.  1  . MAGNESIUM CHLORIDE-CALCIUM PO Take 1 tablet by mouth daily at 3 pm.    . methocarbamol (ROBAXIN) 500 MG tablet Take 500 mg by mouth every 8 (eight) hours as needed for muscle spasms.    . montelukast (SINGULAIR) 10 MG tablet Take 1 tablet (10 mg total) by mouth at bedtime. 90 tablet 0  . Multiple Vitamin (MULTIVITAMIN WITH MINERALS) TABS tablet Take 1 tablet by mouth daily at 3 pm. Centrum for Women    . multivitamin (VIT W/EXTRA C) CHEW chewable tablet Chew by mouth.    . naproxen (EC NAPROSYN) 500 MG EC tablet Take 1 tablet twice a day with food    . naproxen (NAPROSYN) 500 MG tablet Take 500 mg by mouth 2 (two) times daily as needed.    . neomycin-polymyxin-hydrocortisone (CORTISPORIN) OTIC solution Apply 1-2 drops to toe after soaking twice a day 10 mL 0  . nortriptyline (PAMELOR) 25 MG capsule Take 1 capsule (25 mg total) by mouth at bedtime. 30 capsule 0  . ondansetron (ZOFRAN) 4 MG tablet Take 1 tablet (4 mg total) by mouth every 8 (eight) hours as needed for nausea or vomiting. 20 tablet 1  . oxyCODONE-acetaminophen (PERCOCET/ROXICET) 5-325 MG tablet     . pantoprazole (PROTONIX) 40 MG tablet Take 40 mg by mouth 2 (two) times daily.     Marland Kitchen PARoxetine Mesylate 7.5 MG CAPS     . SUPER B COMPLEX/C PO Take 1 capsule by mouth daily at 3 pm.    . TURMERIC PO Take 1 capsule by  mouth daily at 3 pm.    . Turmeric POWD Take by mouth.    . Vitamins-Lipotropics (COMPLEX B-100) TBCR Take by mouth.     No current facility-administered medications on file prior to visit.     Allergies  Allergen Reactions  . Latex Itching, Swelling and Other (See Comments)    redness  . Sulfa Antibiotics Diarrhea and Nausea And Vomiting  . Aspirin Other (See Comments)    Can cause her to bleed out due to G6 PD  . Other Other (See Comments)  . Oxycodone Itching    "all over"  . Tramadol Other (See Comments)    Makes her feel "spaced out on street drugs"    Objective: There were no vitals filed for this visit.  General: No acute distress, AAOx3  Left  foot: Incisions intact with no gapping or dehiscence at surgical site, mild swelling to left forefoot, localized erythema, no warmth, minimal clear drainage, no other signs of infection noted, Capillary fill time <3 seconds in all digits with K wires intact to toes, gross sensation present via light touch to left foot. No pain with calf compression.   Post Op Xray, Left foot: Kwires intact. Osteotomy sites healing. Hardware intact. Soft tissue swelling within normal limits for post op status.   Assessment and Plan:  Problem List Items Addressed This Visit    None    Visit Diagnoses    Hammertoe of left foot    -  Primary   Relevant Orders   DG Foot 2 Views Right (Completed)   Surgery follow-up       Relevant Orders   DG Foot 2 Views Right (Completed)   Bunion       Left foot pain       Pain and swelling of toe of left foot           -Patient seen and evaluated -X-rays reviewed -Sutures removed and K wires left intact until next visit -Applied dry sterile dressing to surgical site left foot and advised patient to keep clean dry and intact -Prescribed Augmentin for patient to take for preventative measures against any localized infection due to increased pain and swelling and very minimal amount of drainage with localized  redness which is likely secondary to increased swelling -Advised patient to continue with post-op shoe and/or cam boot on left -Advised patient to continue to limit activity and to ice and elevate as instructed -Will plan for follow-up with Dr. Charlsie Merlesegal for possible K wire removal at next office visit. In the meantime, patient to call office if any issues or problems arise.   Asencion Islamitorya Betsy Rosello, DPM

## 2019-06-20 DIAGNOSIS — G4700 Insomnia, unspecified: Secondary | ICD-10-CM | POA: Diagnosis not present

## 2019-06-20 DIAGNOSIS — G4719 Other hypersomnia: Secondary | ICD-10-CM | POA: Diagnosis not present

## 2019-06-20 DIAGNOSIS — R0683 Snoring: Secondary | ICD-10-CM | POA: Diagnosis not present

## 2019-06-22 DIAGNOSIS — M654 Radial styloid tenosynovitis [de Quervain]: Secondary | ICD-10-CM | POA: Insufficient documentation

## 2019-06-27 ENCOUNTER — Telehealth: Payer: Self-pay | Admitting: *Deleted

## 2019-06-27 DIAGNOSIS — M79644 Pain in right finger(s): Secondary | ICD-10-CM | POA: Diagnosis not present

## 2019-06-27 DIAGNOSIS — H04123 Dry eye syndrome of bilateral lacrimal glands: Secondary | ICD-10-CM | POA: Diagnosis not present

## 2019-06-27 DIAGNOSIS — M65331 Trigger finger, right middle finger: Secondary | ICD-10-CM | POA: Diagnosis not present

## 2019-06-27 DIAGNOSIS — M25641 Stiffness of right hand, not elsewhere classified: Secondary | ICD-10-CM | POA: Diagnosis not present

## 2019-06-27 NOTE — Telephone Encounter (Signed)
I informed pt that on occasion the pins will come out either on their own or catching on something, never push back in, cover the hole with antibiotic ointment and a bandaid, call the office if increase redness, swelling, or discharge. Pt states understanding.

## 2019-06-27 NOTE — Telephone Encounter (Signed)
Pt states she returned to work today and turned in her chair and pulled a pin from one of her toes.

## 2019-06-29 ENCOUNTER — Ambulatory Visit (INDEPENDENT_AMBULATORY_CARE_PROVIDER_SITE_OTHER): Payer: Federal, State, Local not specified - PPO

## 2019-06-29 ENCOUNTER — Other Ambulatory Visit: Payer: Self-pay

## 2019-06-29 ENCOUNTER — Encounter: Payer: Self-pay | Admitting: Podiatry

## 2019-06-29 ENCOUNTER — Ambulatory Visit (INDEPENDENT_AMBULATORY_CARE_PROVIDER_SITE_OTHER): Payer: Federal, State, Local not specified - PPO | Admitting: Podiatry

## 2019-06-29 DIAGNOSIS — Z09 Encounter for follow-up examination after completed treatment for conditions other than malignant neoplasm: Secondary | ICD-10-CM

## 2019-06-29 DIAGNOSIS — M21619 Bunion of unspecified foot: Secondary | ICD-10-CM

## 2019-06-29 DIAGNOSIS — M2042 Other hammer toe(s) (acquired), left foot: Secondary | ICD-10-CM

## 2019-07-02 NOTE — Progress Notes (Signed)
   Subjective:  Patient presents today status post left forefoot surgery. DOS: 05/24/2019. She reports some soreness and associated swelling. She notes the pin of the 2nd toe came out. She has been using the post op shoe as directed. There are no worsening factors indicated at this time. She is still taking the course of Augmentin prescribed at her previous visit. Patient is here for further evaluation and treatment.    Past Medical History:  Diagnosis Date  . Arthritis   . Depression   . Fibromyalgia 09/04/2014  . GERD (gastroesophageal reflux disease)   . Headache   . Hypertension   . Lumbar radicular pain 09/04/2014  . Pre-diabetes   . Sacroiliac joint pain    nonunion of left SI joint  . Spinal headache   . TIA (transient ischemic attack)   . Wears glasses       Objective/Physical Exam Neurovascular status intact.  Skin incisions appear to be well coapted with sutures and staples intact. No sign of infectious process noted. No dehiscence. No active bleeding noted. Moderate edema noted to the surgical extremity.  Radiographic Exam:  Orthopedic hardware and osteotomies sites appear to be stable with routine healing.  Assessment: 1. s/p left forefoot surgery. DOS: 05/24/2019   Plan of Care:  1. Patient was evaluated. X-rays reviewed 2. Percutaneous pins removed.  3. Continue using post op shoe.  4. Return to clinic in 2 weeks with Dr. Paulla Dolly.    Edrick Kins, DPM [facility]  Dr. Edrick Kins, [address]                                        [place], [place] [address]                Office [phone]  Fax [phone]

## 2019-07-04 DIAGNOSIS — M545 Low back pain: Secondary | ICD-10-CM | POA: Diagnosis not present

## 2019-07-04 DIAGNOSIS — M797 Fibromyalgia: Secondary | ICD-10-CM | POA: Diagnosis not present

## 2019-07-05 DIAGNOSIS — M545 Low back pain: Secondary | ICD-10-CM | POA: Diagnosis not present

## 2019-07-05 DIAGNOSIS — M797 Fibromyalgia: Secondary | ICD-10-CM | POA: Diagnosis not present

## 2019-07-13 ENCOUNTER — Encounter: Payer: Self-pay | Admitting: Podiatry

## 2019-07-13 ENCOUNTER — Other Ambulatory Visit: Payer: Self-pay

## 2019-07-13 ENCOUNTER — Ambulatory Visit (INDEPENDENT_AMBULATORY_CARE_PROVIDER_SITE_OTHER): Payer: Federal, State, Local not specified - PPO

## 2019-07-13 ENCOUNTER — Ambulatory Visit (INDEPENDENT_AMBULATORY_CARE_PROVIDER_SITE_OTHER): Payer: Federal, State, Local not specified - PPO | Admitting: Podiatry

## 2019-07-13 DIAGNOSIS — M2042 Other hammer toe(s) (acquired), left foot: Secondary | ICD-10-CM | POA: Diagnosis not present

## 2019-07-13 DIAGNOSIS — M2041 Other hammer toe(s) (acquired), right foot: Secondary | ICD-10-CM | POA: Diagnosis not present

## 2019-07-13 DIAGNOSIS — R600 Localized edema: Secondary | ICD-10-CM
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
## 2019-07-21 ENCOUNTER — Ambulatory Visit (INDEPENDENT_AMBULATORY_CARE_PROVIDER_SITE_OTHER): Payer: Federal, State, Local not specified - PPO | Admitting: Podiatry

## 2019-07-21 ENCOUNTER — Ambulatory Visit (INDEPENDENT_AMBULATORY_CARE_PROVIDER_SITE_OTHER): Payer: Federal, State, Local not specified - PPO

## 2019-07-21 ENCOUNTER — Other Ambulatory Visit: Payer: Self-pay

## 2019-07-21 ENCOUNTER — Encounter: Payer: Self-pay | Admitting: Podiatry

## 2019-07-21 DIAGNOSIS — R600 Localized edema: Secondary | ICD-10-CM | POA: Diagnosis not present

## 2019-07-21 DIAGNOSIS — S92301D Fracture of unspecified metatarsal bone(s), right foot, subsequent encounter for fracture with routine healing: Secondary | ICD-10-CM

## 2019-07-21 DIAGNOSIS — M2042 Other hammer toe(s) (acquired), left foot: Secondary | ICD-10-CM

## 2019-07-23 DIAGNOSIS — G4733 Obstructive sleep apnea (adult) (pediatric): Secondary | ICD-10-CM | POA: Diagnosis not present

## 2019-07-24 ENCOUNTER — Other Ambulatory Visit: Payer: Self-pay | Admitting: Emergency Medicine

## 2019-07-25 DIAGNOSIS — G4733 Obstructive sleep apnea (adult) (pediatric): Secondary | ICD-10-CM | POA: Diagnosis not present

## 2019-07-25 NOTE — Progress Notes (Signed)
Subjective:   Patient ID: Ashley [name], female   DOB: 63 y.o.   MRN: [id number]   HPI Patient presents stating her foot is still swelling but feels like it has improved and states the Unna boot has been effective in helping to reduce the swelling.  Not having any current pain   ROS      Objective:  Physical Exam  Neurovascular status intact with patient having forefoot swelling noted left negative Homans sign and no current swelling extending into the ankle with good range of motion first MPJ with no current crepitus of the joint     Assessment:  Patient has been very active on her foot postoperatively and unfortunately has a fracture of the left first metatarsal that may or may not heal but clinically appears to be functioning well with no crepitus of the joint with moderate forefoot swelling     Plan:  H&P reviewed condition with patient in great length.  I did explain that I think there is partial healing of the dorsal first metatarsal with pin in place but that there is quite a bit of separation of a portion of the bone and I am not confident will heal.  I did discuss going back and reoperating versus just watching this and seeing whether or not healing occurs and at this point patient is opted not for surgery but understands that may be possible.  She wants to continue immobilization and at this point I did place her in a wedge shoe to try to continue to reduce the weightbearing forces on the first metatarsal and also I gave her instructions on continued elevation and I did reapply Unna boot.  Reappoint 2 weeks or earlier if needed and again does completely understand that there is a very good chance this will require surgery but will get a just watch it and continue to try to immobilize it currently  X-ray indicates there is been significant fracture of the dorsal portion of the first metatarsal with fixation present but I believe only "holding a portion of the bone.  I am  hoping that this portion will heal and that eventually with secondary intent the rest of the bone will heal but again I did offer her surgery and she does not want it currently and she does understand it may be necessary

## 2019-07-28 DIAGNOSIS — M797 Fibromyalgia: Secondary | ICD-10-CM | POA: Diagnosis not present

## 2019-07-28 DIAGNOSIS — M545 Low back pain: Secondary | ICD-10-CM | POA: Diagnosis not present

## 2019-08-01 ENCOUNTER — Other Ambulatory Visit: Payer: Self-pay | Admitting: Emergency Medicine

## 2019-08-02 DIAGNOSIS — G4733 Obstructive sleep apnea (adult) (pediatric): Secondary | ICD-10-CM | POA: Diagnosis not present

## 2019-08-03 DIAGNOSIS — M797 Fibromyalgia: Secondary | ICD-10-CM | POA: Diagnosis not present

## 2019-08-03 DIAGNOSIS — M545 Low back pain: Secondary | ICD-10-CM | POA: Diagnosis not present

## 2019-08-04 ENCOUNTER — Encounter: Payer: Self-pay | Admitting: Podiatry

## 2019-08-04 ENCOUNTER — Other Ambulatory Visit: Payer: Self-pay

## 2019-08-04 ENCOUNTER — Ambulatory Visit (INDEPENDENT_AMBULATORY_CARE_PROVIDER_SITE_OTHER): Payer: Federal, State, Local not specified - PPO

## 2019-08-04 ENCOUNTER — Ambulatory Visit (INDEPENDENT_AMBULATORY_CARE_PROVIDER_SITE_OTHER): Payer: Self-pay | Admitting: Podiatry

## 2019-08-04 DIAGNOSIS — M21619 Bunion of unspecified foot: Secondary | ICD-10-CM

## 2019-08-04 DIAGNOSIS — M2042 Other hammer toe(s) (acquired), left foot: Secondary | ICD-10-CM

## 2019-08-04 DIAGNOSIS — M21612 Bunion of left foot: Secondary | ICD-10-CM

## 2019-08-04 DIAGNOSIS — Z09 Encounter for follow-up examination after completed treatment for conditions other than malignant neoplasm: Secondary | ICD-10-CM

## 2019-08-04 NOTE — Progress Notes (Signed)
Subjective:   Patient ID: Ashley [name], female   DOB: 63 y.o.   MRN: [id number]   HPI Patient states the swelling is really going down in my foot feels better but my toes at times also will get swollen.  Patient states that she has been wearing the offloading shoe but it is difficult on her back   ROS      Objective:  Physical Exam  Neurovascular status intact with negative Homans sign noted with patient's left foot showing market diminishment of edema with excellent range of motion of the first MPJ with no crepitus of the joint in good alignment.  Second third and fourth digits are in good alignment with minimal discomfort     Assessment:  Clinically he is making real good progress with offloading with good range of motion no crepitus     Plan:  Reviewed condition and x-rays and recommended continued immobilization and explained there is no guarantee this will heal in by secondary intent and if not ultimately it still could require surgery and I again offered her this at this point but will get a hold off and continue to evaluate.  Clinically she is showing good progress with reduced edema minimal discomfort good range of motion  X-ray indicates fixation is in place with hopeful healing of the secondary intent of the osteotomy site with dorsally distraction which may not heal completely but if she is asymptomatic clinically may not give her any problems.  I showed her x-rays discussed them with her and reviewed again the possibility for surgical intervention at one point in this case

## 2019-08-10 DIAGNOSIS — M797 Fibromyalgia: Secondary | ICD-10-CM | POA: Diagnosis not present

## 2019-08-10 DIAGNOSIS — M545 Low back pain: Secondary | ICD-10-CM | POA: Diagnosis not present

## 2019-08-17 DIAGNOSIS — M545 Low back pain: Secondary | ICD-10-CM | POA: Diagnosis not present

## 2019-08-17 DIAGNOSIS — M797 Fibromyalgia: Secondary | ICD-10-CM | POA: Diagnosis not present

## 2019-08-24 DIAGNOSIS — M1812 Unilateral primary osteoarthritis of first carpometacarpal joint, left hand: Secondary | ICD-10-CM | POA: Diagnosis not present

## 2019-08-24 DIAGNOSIS — M65331 Trigger finger, right middle finger: Secondary | ICD-10-CM | POA: Diagnosis not present

## 2019-08-24 DIAGNOSIS — M797 Fibromyalgia: Secondary | ICD-10-CM | POA: Diagnosis not present

## 2019-08-24 DIAGNOSIS — M6588 Other synovitis and tenosynovitis, other site: Secondary | ICD-10-CM | POA: Diagnosis not present

## 2019-08-24 DIAGNOSIS — M545 Low back pain: Secondary | ICD-10-CM | POA: Diagnosis not present

## 2019-08-24 DIAGNOSIS — M65341 Trigger finger, right ring finger: Secondary | ICD-10-CM | POA: Diagnosis not present

## 2019-08-25 ENCOUNTER — Other Ambulatory Visit: Payer: Federal, State, Local not specified - PPO | Admitting: Podiatry

## 2019-08-26 ENCOUNTER — Other Ambulatory Visit: Payer: Self-pay

## 2019-08-26 ENCOUNTER — Ambulatory Visit: Payer: Federal, State, Local not specified - PPO | Admitting: Podiatry

## 2019-08-26 ENCOUNTER — Encounter: Payer: Self-pay | Admitting: Podiatry

## 2019-08-26 ENCOUNTER — Ambulatory Visit (INDEPENDENT_AMBULATORY_CARE_PROVIDER_SITE_OTHER): Payer: Federal, State, Local not specified - PPO

## 2019-08-26 DIAGNOSIS — M2042 Other hammer toe(s) (acquired), left foot: Secondary | ICD-10-CM | POA: Diagnosis not present

## 2019-08-26 DIAGNOSIS — Z09 Encounter for follow-up examination after completed treatment for conditions other than malignant neoplasm: Secondary | ICD-10-CM

## 2019-08-26 DIAGNOSIS — F4321 Adjustment disorder with depressed mood: Secondary | ICD-10-CM | POA: Diagnosis not present

## 2019-08-26 DIAGNOSIS — Z23 Encounter for immunization: Secondary | ICD-10-CM | POA: Diagnosis not present

## 2019-08-26 DIAGNOSIS — Z472 Encounter for removal of internal fixation device: Secondary | ICD-10-CM | POA: Diagnosis not present

## 2019-08-26 DIAGNOSIS — R739 Hyperglycemia, unspecified: Secondary | ICD-10-CM | POA: Diagnosis not present

## 2019-08-26 DIAGNOSIS — I10 Essential (primary) hypertension: Secondary | ICD-10-CM | POA: Diagnosis not present

## 2019-08-26 NOTE — Progress Notes (Signed)
Subjective:   Patient ID: Ashley [name], female   DOB: 63 y.o.   MRN: [id number]   HPI Patient states improved but getting some sharp pain underneath but the swelling has really reduced and I do not have any pain in the joint   ROS      Objective:  Physical Exam  Neurovascular status intact negative Bevelyn Buckles' sign was noted with patient found to have sharpness in the plantar aspect of the first metatarsal head left of a mild nature that comes periodically with good range of motion no crepitus of the joint     Assessment:  Patient had a fracture of the first metatarsal left which has been healing slowly with possibility for abnormal pin position     Plan:  H&P reviewed condition and do think the proximal pin is too long with creating irritation but good healing has occurred with secondary intent and the osteotomy appears to be strong at this point.  I have recommended pin removal allowed her to read consent form for proximal pin removal and she wants this done and after review signed consent form and is encouraged to call with questions prior to the office surgery  X-rays indicate that there is good healing of the osteotomy with secondary healing which is occurred with no indications of current pathology with fixation in place

## 2019-08-29 ENCOUNTER — Other Ambulatory Visit: Payer: Self-pay | Admitting: Orthopedic Surgery

## 2019-08-29 DIAGNOSIS — M659 Synovitis and tenosynovitis, unspecified: Secondary | ICD-10-CM

## 2019-08-30 DIAGNOSIS — R7309 Other abnormal glucose: Secondary | ICD-10-CM | POA: Diagnosis not present

## 2019-08-31 DIAGNOSIS — M545 Low back pain: Secondary | ICD-10-CM | POA: Diagnosis not present

## 2019-08-31 DIAGNOSIS — M797 Fibromyalgia: Secondary | ICD-10-CM | POA: Diagnosis not present

## 2019-09-01 DIAGNOSIS — G4733 Obstructive sleep apnea (adult) (pediatric): Secondary | ICD-10-CM | POA: Diagnosis not present

## 2019-09-06 ENCOUNTER — Telehealth: Payer: Self-pay | Admitting: Podiatry

## 2019-09-06 NOTE — Telephone Encounter (Signed)
OFFICE SURGERY DATE OF SERVICE: 09/28/2019 SURGICAL PROCEDURE: Removal Fixation Deep Kwire/Screw [id number])  Maeola Sarah Cross [place]  Effective Date: 11/09/2016  $150 Co-Pay for Surgeon In Office Surgery Fee  CoInsurance: 70%/30%  Out of Pocket Maximum is $5,500 with $3,539.92 met and $1,960.08 remaining.  No prior authorization or referrals are required per Joy C. Auth# [id number].

## 2019-09-08 ENCOUNTER — Telehealth: Payer: Self-pay | Admitting: *Deleted

## 2019-09-08 NOTE — Telephone Encounter (Signed)
[place] that is dying of end stage cancer, and she needs several ace wraps and offloading pads.

## 2019-09-08 NOTE — Telephone Encounter (Signed)
I informed pt Dr. Paulla Dolly stated she could come by and just pick some up and also we needed to see her when she returned to make sure she is improving. Pt states understanding.

## 2019-09-09 ENCOUNTER — Ambulatory Visit
Admission: RE | Admit: 2019-09-09 | Discharge: 2019-09-09 | Disposition: A | Discharge status: Home / Self Care | Payer: Federal, State, Local not specified - PPO | Source: Ambulatory Visit | Attending: Orthopedic Surgery | Admitting: Orthopedic Surgery

## 2019-09-09 DIAGNOSIS — M659 Synovitis and tenosynovitis, unspecified: Secondary | ICD-10-CM

## 2019-09-09 DIAGNOSIS — M7989 Other specified soft tissue disorders: Secondary | ICD-10-CM | POA: Diagnosis not present

## 2019-09-23 ENCOUNTER — Telehealth: Payer: Self-pay | Admitting: Podiatry

## 2019-09-23 NOTE — Telephone Encounter (Signed)
I'm scheduled for office surgery on Wednesday but I'm out of town with my terminally ill sister and will not be back in town this week. I need to reschedule. I rescheduled pt's office sx to 12 noon on Wednesday 16 December and told her we would contact her if there was any problems.

## 2019-09-28 ENCOUNTER — Ambulatory Visit: Payer: Federal, State, Local not specified - PPO | Admitting: Podiatry

## 2019-10-02 DIAGNOSIS — G4733 Obstructive sleep apnea (adult) (pediatric): Secondary | ICD-10-CM | POA: Diagnosis not present

## 2019-10-12 ENCOUNTER — Encounter: Payer: Federal, State, Local not specified - PPO | Admitting: Podiatry

## 2019-10-17 DIAGNOSIS — G4733 Obstructive sleep apnea (adult) (pediatric): Secondary | ICD-10-CM | POA: Diagnosis not present

## 2019-10-19 ENCOUNTER — Encounter: Payer: Self-pay | Admitting: Podiatry

## 2019-10-19 ENCOUNTER — Ambulatory Visit: Payer: Federal, State, Local not specified - PPO | Admitting: Podiatry

## 2019-10-19 ENCOUNTER — Other Ambulatory Visit: Payer: Self-pay

## 2019-10-19 VITALS — BP 125/78 | HR 95 | Temp 97.5°F | Resp 16

## 2019-10-19 DIAGNOSIS — Z472 Encounter for removal of internal fixation device: Secondary | ICD-10-CM | POA: Diagnosis not present

## 2019-10-19 NOTE — Progress Notes (Signed)
Subjective:   Patient ID: Ashley [name], female   DOB: 63 y.o.   MRN: [id number]   HPI Patient presents for removal of proximal pin left first metatarsal neuro   ROS      Objective:  Physical Exam  Vascular status intact with pin notes moved in a plantar direction left first metatarsal creating some plantar irritation with good range of motion but irritation plantarly     Assessment:  Abnormal pin position left first metatarsal     Plan:  H&P reviewed condition and recommended pin removal and I anesthetized 60 mg like Marcaine mixture took patient to the OR and patient had normal prep done and then tourniquet inflated to 2 mils of mercury.  Follow procedure was performed Tedrick dorsal aspect left foot where a 4 cm incision was made over the shaft of the metatarsal and the incision was deepened through subcu tissue down to capsule and the capsular tissue was sharply dissected off the underlying bone.  I identified the pin removed in toto flushed with copious Garamycin solution and sutured with 5-0 nylon.  Applied sterile dressing instructed on continued elevation and patient left the OR in satisfactory condition with good digital perfusion

## 2019-10-26 ENCOUNTER — Other Ambulatory Visit: Payer: Self-pay | Admitting: Podiatry

## 2019-10-26 ENCOUNTER — Other Ambulatory Visit: Payer: Self-pay

## 2019-10-26 ENCOUNTER — Encounter: Payer: Self-pay | Admitting: Podiatry

## 2019-10-26 ENCOUNTER — Ambulatory Visit (INDEPENDENT_AMBULATORY_CARE_PROVIDER_SITE_OTHER): Payer: Federal, State, Local not specified - PPO

## 2019-10-26 ENCOUNTER — Ambulatory Visit (INDEPENDENT_AMBULATORY_CARE_PROVIDER_SITE_OTHER): Payer: Federal, State, Local not specified - PPO | Admitting: Podiatry

## 2019-10-26 DIAGNOSIS — M779 Enthesopathy, unspecified: Secondary | ICD-10-CM

## 2019-10-26 DIAGNOSIS — Z9889 Other specified postprocedural states: Secondary | ICD-10-CM

## 2019-10-26 NOTE — Progress Notes (Signed)
Subjective:   Patient ID: Ashley [name], female   DOB: 63 y.o.   MRN: [id number]   HPI Patient presents stating my foot feels much better with removal of the pin but I do have flatfeet and I know I am getting need something for that   ROS      Objective:  Physical Exam  Neurovascular status intact with patient found to have chronic flatfoot deformity bilateral with history of bunion surgery hammertoe surgery bilateral with good range of motion first MPJ left stitches intact with no pain upon range of motion or plantar pressure to the area     Assessment:  Doing well post pin removal left with stitches intact with flatfoot deformity     Plan:  H&P reviewed condition and at this point casted for functional orthotics to lift up the arches.  I removed stitches wound edges well coapted applied sterile dressing and patient will be seen back for orthotic dispensing or any other issues that occur  X-rays indicate pin is been satisfactory move left first metatarsal with bone healing well with moderate shortening secondary to previous fracture but overall no clinical symptoms noted

## 2019-10-31 DIAGNOSIS — L309 Dermatitis, unspecified: Secondary | ICD-10-CM | POA: Diagnosis not present

## 2019-11-01 DIAGNOSIS — G4733 Obstructive sleep apnea (adult) (pediatric): Secondary | ICD-10-CM | POA: Diagnosis not present

## 2019-11-02 ENCOUNTER — Encounter: Payer: Federal, State, Local not specified - PPO | Admitting: Podiatry

## 2019-11-03 ENCOUNTER — Other Ambulatory Visit: Payer: Self-pay | Admitting: Orthopedic Surgery

## 2019-11-03 DIAGNOSIS — G4733 Obstructive sleep apnea (adult) (pediatric): Secondary | ICD-10-CM | POA: Diagnosis not present

## 2019-11-03 DIAGNOSIS — M545 Low back pain, unspecified: Secondary | ICD-10-CM

## 2019-11-07 ENCOUNTER — Encounter: Payer: Self-pay | Admitting: Podiatry

## 2019-11-07 ENCOUNTER — Ambulatory Visit (INDEPENDENT_AMBULATORY_CARE_PROVIDER_SITE_OTHER): Payer: Federal, State, Local not specified - PPO | Admitting: Podiatry

## 2019-11-07 ENCOUNTER — Other Ambulatory Visit: Payer: Self-pay

## 2019-11-07 ENCOUNTER — Ambulatory Visit (INDEPENDENT_AMBULATORY_CARE_PROVIDER_SITE_OTHER): Payer: Federal, State, Local not specified - PPO

## 2019-11-07 ENCOUNTER — Other Ambulatory Visit: Payer: Federal, State, Local not specified - PPO | Admitting: Orthotics

## 2019-11-07 DIAGNOSIS — Z9889 Other specified postprocedural states: Secondary | ICD-10-CM | POA: Diagnosis not present

## 2019-11-07 DIAGNOSIS — R600 Localized edema: Secondary | ICD-10-CM

## 2019-11-07 NOTE — Progress Notes (Signed)
Subjective:   Patient ID: Ashley [name], female   DOB: 64 y.o.   MRN: [id number]   HPI Patient is concerned because she has some swelling in the outside of her left foot and at times can be painful.  States her big toe joint seems to be continuing to improve and removing the pad has been beneficial but she was just concerned about the swelling in her foot   ROS      Objective:  Physical Exam  Neurovascular status intact negative Denna Haggard' sign noted with moderate swelling in the lateral side left foot around the midtarsal joint with mild discomfort associated with it     Assessment:  Cannot rule out stress fracture or other bone pathology     Plan:  H&P reviewed condition and recommended compression and applied ankle compression stocking along with elevation and I wonder reevaluate this again when orthotics come in in the next several weeks.  Gave her strict instructions to call if any issues were to occur  X-rays indicate the fracture of her first metatarsal of the left continues to heal and while it shortened it does appear to be functioning adequately with no indications of arthritis of the joint surface.  I conveyed this to patient and did not see any other pathology current

## 2019-11-08 DIAGNOSIS — M659 Synovitis and tenosynovitis, unspecified: Secondary | ICD-10-CM | POA: Diagnosis not present

## 2019-11-08 DIAGNOSIS — M65341 Trigger finger, right ring finger: Secondary | ICD-10-CM | POA: Diagnosis not present

## 2019-11-08 DIAGNOSIS — M65331 Trigger finger, right middle finger: Secondary | ICD-10-CM | POA: Diagnosis not present

## 2019-11-08 DIAGNOSIS — M654 Radial styloid tenosynovitis [de Quervain]: Secondary | ICD-10-CM | POA: Diagnosis not present

## 2019-11-09 ENCOUNTER — Ambulatory Visit: Payer: Federal, State, Local not specified - PPO | Admitting: Podiatry

## 2019-11-16 ENCOUNTER — Other Ambulatory Visit: Payer: Self-pay

## 2019-11-16 ENCOUNTER — Ambulatory Visit (INDEPENDENT_AMBULATORY_CARE_PROVIDER_SITE_OTHER): Payer: Self-pay | Admitting: Podiatry

## 2019-11-16 ENCOUNTER — Encounter: Payer: Self-pay | Admitting: Podiatry

## 2019-11-16 DIAGNOSIS — K219 Gastro-esophageal reflux disease without esophagitis: Secondary | ICD-10-CM | POA: Diagnosis not present

## 2019-11-16 DIAGNOSIS — M779 Enthesopathy, unspecified: Secondary | ICD-10-CM

## 2019-11-16 DIAGNOSIS — Z1211 Encounter for screening for malignant neoplasm of colon: Secondary | ICD-10-CM | POA: Diagnosis not present

## 2019-11-18 NOTE — Progress Notes (Signed)
Subjective:   Patient ID: Ashley [name], female   DOB: 64 y.o.   MRN: [id number]   HPI Patient presents stating she is doing well and has lost 13 pounds and her feet are feeling much much better and seem to be recovering well   ROS      Objective:  Physical Exam  Neurovascular status intact with inflammation that seems to be improving with pain upon deep palpation but doing much better with good range of motion incision sites that have healed good position of the toes     Assessment:  Patient appears to be doing very well after having had inflammatory process and seems to be recovering      Plan:  H&P condition reviewed and I have recommended continued range of motion physical therapy and supportive shoes and we are getting orthotics which are not here yet

## 2019-11-22 DIAGNOSIS — L309 Dermatitis, unspecified: Secondary | ICD-10-CM | POA: Diagnosis not present

## 2019-11-24 ENCOUNTER — Other Ambulatory Visit: Payer: Self-pay

## 2019-11-24 ENCOUNTER — Ambulatory Visit (INDEPENDENT_AMBULATORY_CARE_PROVIDER_SITE_OTHER): Payer: Federal, State, Local not specified - PPO | Admitting: Orthotics

## 2019-11-24 DIAGNOSIS — M779 Enthesopathy, unspecified: Secondary | ICD-10-CM | POA: Diagnosis not present

## 2019-11-24 DIAGNOSIS — Z9889 Other specified postprocedural states: Secondary | ICD-10-CM

## 2019-11-24 NOTE — Progress Notes (Signed)
Patient came in today to pick up custom made foot orthotics.  The goals were accomplished and the patient reported no dissatisfaction with said orthotics.  Patient was advised of breakin period and how to report any issues. 

## 2019-11-26 ENCOUNTER — Ambulatory Visit
Admission: RE | Admit: 2019-11-26 | Discharge: 2019-11-26 | Disposition: A | Discharge status: Home / Self Care | Payer: Federal, State, Local not specified - PPO | Source: Ambulatory Visit | Attending: Orthopedic Surgery | Admitting: Orthopedic Surgery

## 2019-11-26 DIAGNOSIS — M545 Low back pain, unspecified: Secondary | ICD-10-CM

## 2019-11-26 DIAGNOSIS — M48061 Spinal stenosis, lumbar region without neurogenic claudication: Secondary | ICD-10-CM | POA: Diagnosis not present

## 2019-11-28 ENCOUNTER — Other Ambulatory Visit: Payer: Federal, State, Local not specified - PPO | Admitting: Orthotics

## 2019-12-06 ENCOUNTER — Other Ambulatory Visit: Payer: Self-pay | Admitting: Family Medicine

## 2019-12-06 DIAGNOSIS — Z1231 Encounter for screening mammogram for malignant neoplasm of breast: Secondary | ICD-10-CM

## 2019-12-13 DIAGNOSIS — H01111 Allergic dermatitis of right upper eyelid: Secondary | ICD-10-CM | POA: Diagnosis not present

## 2019-12-14 DIAGNOSIS — M65341 Trigger finger, right ring finger: Secondary | ICD-10-CM | POA: Diagnosis not present

## 2019-12-14 DIAGNOSIS — M6530 Trigger finger, unspecified finger: Secondary | ICD-10-CM | POA: Diagnosis not present

## 2019-12-14 DIAGNOSIS — M654 Radial styloid tenosynovitis [de Quervain]: Secondary | ICD-10-CM | POA: Diagnosis not present

## 2019-12-14 DIAGNOSIS — M659 Synovitis and tenosynovitis, unspecified: Secondary | ICD-10-CM | POA: Diagnosis not present

## 2019-12-15 DIAGNOSIS — M1990 Unspecified osteoarthritis, unspecified site: Secondary | ICD-10-CM | POA: Diagnosis not present

## 2019-12-15 DIAGNOSIS — G4733 Obstructive sleep apnea (adult) (pediatric): Secondary | ICD-10-CM | POA: Diagnosis not present

## 2019-12-15 DIAGNOSIS — H04129 Dry eye syndrome of unspecified lacrimal gland: Secondary | ICD-10-CM | POA: Diagnosis not present

## 2019-12-15 DIAGNOSIS — M797 Fibromyalgia: Secondary | ICD-10-CM | POA: Diagnosis not present

## 2019-12-15 DIAGNOSIS — M2550 Pain in unspecified joint: Secondary | ICD-10-CM | POA: Diagnosis not present

## 2020-01-16 ENCOUNTER — Ambulatory Visit
Admission: RE | Admit: 2020-01-16 | Discharge: 2020-01-16 | Disposition: A | Discharge status: Home / Self Care | Payer: Federal, State, Local not specified - PPO | Source: Ambulatory Visit | Attending: Family Medicine | Admitting: Family Medicine

## 2020-01-16 ENCOUNTER — Other Ambulatory Visit: Payer: Self-pay

## 2020-01-16 DIAGNOSIS — M797 Fibromyalgia: Secondary | ICD-10-CM | POA: Diagnosis not present

## 2020-01-16 DIAGNOSIS — Z01411 Encounter for gynecological examination (general) (routine) with abnormal findings: Secondary | ICD-10-CM | POA: Diagnosis not present

## 2020-01-16 DIAGNOSIS — Z1231 Encounter for screening mammogram for malignant neoplasm of breast: Secondary | ICD-10-CM

## 2020-01-16 DIAGNOSIS — M545 Low back pain: Secondary | ICD-10-CM | POA: Diagnosis not present

## 2020-01-17 DIAGNOSIS — M5416 Radiculopathy, lumbar region: Secondary | ICD-10-CM | POA: Diagnosis not present

## 2020-01-17 DIAGNOSIS — M461 Sacroiliitis, not elsewhere classified: Secondary | ICD-10-CM | POA: Diagnosis not present

## 2020-01-17 DIAGNOSIS — M791 Myalgia, unspecified site: Secondary | ICD-10-CM | POA: Diagnosis not present

## 2020-02-07 DIAGNOSIS — M461 Sacroiliitis, not elsewhere classified: Secondary | ICD-10-CM | POA: Diagnosis not present

## 2020-02-07 DIAGNOSIS — M5416 Radiculopathy, lumbar region: Secondary | ICD-10-CM | POA: Diagnosis not present

## 2020-02-07 DIAGNOSIS — M791 Myalgia, unspecified site: Secondary | ICD-10-CM | POA: Diagnosis not present

## 2020-02-09 ENCOUNTER — Telehealth: Payer: Self-pay | Admitting: Podiatry

## 2020-02-09 NOTE — Telephone Encounter (Signed)
My guarantor account number is [id number] and I have a balance of $429.30. I sent a payment for $45 a couple of days ago. I wanted to set up payment arrangements for the rest. If you can call me back, I would appreciate it. Thank you.

## 2020-02-20 DIAGNOSIS — M791 Myalgia, unspecified site: Secondary | ICD-10-CM | POA: Diagnosis not present

## 2020-02-20 DIAGNOSIS — M5416 Radiculopathy, lumbar region: Secondary | ICD-10-CM | POA: Diagnosis not present

## 2020-02-20 DIAGNOSIS — M461 Sacroiliitis, not elsewhere classified: Secondary | ICD-10-CM | POA: Diagnosis not present

## 2020-02-25 ENCOUNTER — Encounter (HOSPITAL_COMMUNITY): Payer: Self-pay

## 2020-02-25 ENCOUNTER — Other Ambulatory Visit: Payer: Self-pay

## 2020-02-25 DIAGNOSIS — T2017XA Burn of first degree of neck, initial encounter: Secondary | ICD-10-CM | POA: Diagnosis not present

## 2020-02-25 DIAGNOSIS — Z79899 Other long term (current) drug therapy: Secondary | ICD-10-CM | POA: Insufficient documentation

## 2020-02-25 DIAGNOSIS — Y929 Unspecified place or not applicable: Secondary | ICD-10-CM | POA: Insufficient documentation

## 2020-02-25 DIAGNOSIS — T148XXA Other injury of unspecified body region, initial encounter: Secondary | ICD-10-CM | POA: Insufficient documentation

## 2020-02-25 DIAGNOSIS — Z87891 Personal history of nicotine dependence: Secondary | ICD-10-CM | POA: Insufficient documentation

## 2020-02-25 DIAGNOSIS — T23272A Burn of second degree of left wrist, initial encounter: Secondary | ICD-10-CM | POA: Diagnosis not present

## 2020-02-25 DIAGNOSIS — Y999 Unspecified external cause status: Secondary | ICD-10-CM | POA: Insufficient documentation

## 2020-02-25 DIAGNOSIS — Y939 Activity, unspecified: Secondary | ICD-10-CM | POA: Diagnosis not present
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
## 2020-02-26 ENCOUNTER — Emergency Department (HOSPITAL_COMMUNITY)
Admission: EM | Admit: 2020-02-26 | Discharge: 2020-02-26 | Disposition: A | Discharge status: Home / Self Care | Payer: Federal, State, Local not specified - PPO | Attending: Emergency Medicine | Admitting: Emergency Medicine

## 2020-02-26 DIAGNOSIS — T23272A Burn of second degree of left wrist, initial encounter: Secondary | ICD-10-CM

## 2020-02-26 DIAGNOSIS — W2211XA Striking against or struck by driver side automobile airbag, initial encounter: Secondary | ICD-10-CM

## 2020-02-26 DIAGNOSIS — T07XXXA Unspecified multiple injuries, initial encounter: Secondary | ICD-10-CM

## 2020-02-26 DIAGNOSIS — T2017XA Burn of first degree of neck, initial encounter: Secondary | ICD-10-CM

## 2020-02-26 MED ORDER — KETOROLAC TROMETHAMINE 15 MG/ML IJ SOLN
30.0000 mg | Freq: Once | INTRAMUSCULAR | Status: AC
  Administered 2020-02-26: 30 mg via INTRAMUSCULAR
  Filled 2020-02-26: qty 2
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
## 2020-02-27 DIAGNOSIS — M5416 Radiculopathy, lumbar region: Secondary | ICD-10-CM | POA: Diagnosis not present

## 2020-02-27 DIAGNOSIS — M791 Myalgia, unspecified site: Secondary | ICD-10-CM | POA: Diagnosis not present

## 2020-02-27 DIAGNOSIS — M461 Sacroiliitis, not elsewhere classified: Secondary | ICD-10-CM | POA: Diagnosis not present

## 2020-02-28 DIAGNOSIS — T2017XD Burn of first degree of neck, subsequent encounter: Secondary | ICD-10-CM | POA: Diagnosis not present

## 2020-02-28 DIAGNOSIS — T23272D Burn of second degree of left wrist, subsequent encounter: Secondary | ICD-10-CM | POA: Diagnosis not present

## 2020-02-28 DIAGNOSIS — T07XXXD Unspecified multiple injuries, subsequent encounter: Secondary | ICD-10-CM | POA: Diagnosis not present

## 2020-02-29 DIAGNOSIS — Z79891 Long term (current) use of opiate analgesic: Secondary | ICD-10-CM | POA: Diagnosis not present

## 2020-02-29 DIAGNOSIS — M5136 Other intervertebral disc degeneration, lumbar region: Secondary | ICD-10-CM | POA: Diagnosis not present

## 2020-02-29 DIAGNOSIS — M961 Postlaminectomy syndrome, not elsewhere classified: Secondary | ICD-10-CM | POA: Diagnosis not present

## 2020-02-29 DIAGNOSIS — M533 Sacrococcygeal disorders, not elsewhere classified: Secondary | ICD-10-CM | POA: Diagnosis not present

## 2020-02-29 DIAGNOSIS — G894 Chronic pain syndrome: Secondary | ICD-10-CM | POA: Diagnosis not present

## 2020-02-29 DIAGNOSIS — Z79899 Other long term (current) drug therapy: Secondary | ICD-10-CM | POA: Diagnosis not present

## 2020-03-05 DIAGNOSIS — M461 Sacroiliitis, not elsewhere classified: Secondary | ICD-10-CM | POA: Diagnosis not present

## 2020-03-05 DIAGNOSIS — M5416 Radiculopathy, lumbar region: Secondary | ICD-10-CM | POA: Diagnosis not present

## 2020-03-05 DIAGNOSIS — M791 Myalgia, unspecified site: Secondary | ICD-10-CM | POA: Diagnosis not present

## 2020-03-12 DIAGNOSIS — M5416 Radiculopathy, lumbar region: Secondary | ICD-10-CM | POA: Diagnosis not present

## 2020-03-12 DIAGNOSIS — M791 Myalgia, unspecified site: Secondary | ICD-10-CM | POA: Diagnosis not present

## 2020-03-12 DIAGNOSIS — M461 Sacroiliitis, not elsewhere classified: Secondary | ICD-10-CM | POA: Diagnosis not present

## 2020-03-13 DIAGNOSIS — H04129 Dry eye syndrome of unspecified lacrimal gland: Secondary | ICD-10-CM | POA: Diagnosis not present

## 2020-03-13 DIAGNOSIS — M797 Fibromyalgia: Secondary | ICD-10-CM | POA: Diagnosis not present

## 2020-03-13 DIAGNOSIS — M2550 Pain in unspecified joint: Secondary | ICD-10-CM | POA: Diagnosis not present

## 2020-03-13 DIAGNOSIS — M1990 Unspecified osteoarthritis, unspecified site: Secondary | ICD-10-CM | POA: Diagnosis not present

## 2020-03-21 DIAGNOSIS — M5416 Radiculopathy, lumbar region: Secondary | ICD-10-CM | POA: Diagnosis not present

## 2020-03-21 DIAGNOSIS — M461 Sacroiliitis, not elsewhere classified: Secondary | ICD-10-CM | POA: Diagnosis not present

## 2020-03-21 DIAGNOSIS — M791 Myalgia, unspecified site: Secondary | ICD-10-CM | POA: Diagnosis not present

## 2020-03-26 DIAGNOSIS — M461 Sacroiliitis, not elsewhere classified: Secondary | ICD-10-CM | POA: Diagnosis not present

## 2020-03-26 DIAGNOSIS — M5416 Radiculopathy, lumbar region: Secondary | ICD-10-CM | POA: Diagnosis not present

## 2020-03-26 DIAGNOSIS — M791 Myalgia, unspecified site: Secondary | ICD-10-CM | POA: Diagnosis not present

## 2020-04-11 DIAGNOSIS — M791 Myalgia, unspecified site: Secondary | ICD-10-CM | POA: Diagnosis not present

## 2020-04-11 DIAGNOSIS — G4733 Obstructive sleep apnea (adult) (pediatric): Secondary | ICD-10-CM | POA: Diagnosis not present

## 2020-04-11 DIAGNOSIS — M5416 Radiculopathy, lumbar region: Secondary | ICD-10-CM | POA: Diagnosis not present

## 2020-04-11 DIAGNOSIS — M461 Sacroiliitis, not elsewhere classified: Secondary | ICD-10-CM | POA: Diagnosis not present

## 2020-04-13 DIAGNOSIS — M461 Sacroiliitis, not elsewhere classified: Secondary | ICD-10-CM | POA: Diagnosis not present

## 2020-04-13 DIAGNOSIS — M5416 Radiculopathy, lumbar region: Secondary | ICD-10-CM | POA: Diagnosis not present

## 2020-04-13 DIAGNOSIS — M791 Myalgia, unspecified site: Secondary | ICD-10-CM | POA: Diagnosis not present

## 2020-04-16 DIAGNOSIS — M5416 Radiculopathy, lumbar region: Secondary | ICD-10-CM | POA: Diagnosis not present

## 2020-04-16 DIAGNOSIS — M791 Myalgia, unspecified site: Secondary | ICD-10-CM | POA: Diagnosis not present

## 2020-04-16 DIAGNOSIS — M461 Sacroiliitis, not elsewhere classified: Secondary | ICD-10-CM | POA: Diagnosis not present

## 2020-04-23 DIAGNOSIS — E785 Hyperlipidemia, unspecified: Secondary | ICD-10-CM | POA: Diagnosis not present

## 2020-04-23 DIAGNOSIS — I10 Essential (primary) hypertension: Secondary | ICD-10-CM | POA: Diagnosis not present

## 2020-04-23 DIAGNOSIS — M130 Polyarthritis, unspecified: Secondary | ICD-10-CM | POA: Diagnosis not present

## 2020-04-23 DIAGNOSIS — R635 Abnormal weight gain: Secondary | ICD-10-CM | POA: Diagnosis not present

## 2020-04-23 DIAGNOSIS — F329 Major depressive disorder, single episode, unspecified: Secondary | ICD-10-CM | POA: Diagnosis not present

## 2020-04-23 DIAGNOSIS — M797 Fibromyalgia: Secondary | ICD-10-CM | POA: Diagnosis not present

## 2020-04-24 DIAGNOSIS — M65341 Trigger finger, right ring finger: Secondary | ICD-10-CM | POA: Diagnosis not present

## 2020-04-24 DIAGNOSIS — M6530 Trigger finger, unspecified finger: Secondary | ICD-10-CM | POA: Diagnosis not present

## 2020-04-24 DIAGNOSIS — M1812 Unilateral primary osteoarthritis of first carpometacarpal joint, left hand: Secondary | ICD-10-CM | POA: Diagnosis not present

## 2020-04-30 ENCOUNTER — Other Ambulatory Visit: Payer: Self-pay | Admitting: Orthopedic Surgery

## 2020-04-30 DIAGNOSIS — M5416 Radiculopathy, lumbar region: Secondary | ICD-10-CM | POA: Diagnosis not present

## 2020-04-30 DIAGNOSIS — M461 Sacroiliitis, not elsewhere classified: Secondary | ICD-10-CM | POA: Diagnosis not present

## 2020-04-30 DIAGNOSIS — M791 Myalgia, unspecified site: Secondary | ICD-10-CM | POA: Diagnosis not present

## 2020-04-30 DIAGNOSIS — M6530 Trigger finger, unspecified finger: Secondary | ICD-10-CM

## 2020-05-01 DIAGNOSIS — G8929 Other chronic pain: Secondary | ICD-10-CM | POA: Diagnosis not present

## 2020-05-01 DIAGNOSIS — M5442 Lumbago with sciatica, left side: Secondary | ICD-10-CM | POA: Diagnosis not present

## 2020-05-08 DIAGNOSIS — M5416 Radiculopathy, lumbar region: Secondary | ICD-10-CM | POA: Diagnosis not present

## 2020-05-08 DIAGNOSIS — M791 Myalgia, unspecified site: Secondary | ICD-10-CM | POA: Diagnosis not present

## 2020-05-08 DIAGNOSIS — M461 Sacroiliitis, not elsewhere classified: Secondary | ICD-10-CM | POA: Diagnosis not present

## 2020-05-15 DIAGNOSIS — M791 Myalgia, unspecified site: Secondary | ICD-10-CM | POA: Diagnosis not present

## 2020-05-15 DIAGNOSIS — M461 Sacroiliitis, not elsewhere classified: Secondary | ICD-10-CM | POA: Diagnosis not present

## 2020-05-15 DIAGNOSIS — M5416 Radiculopathy, lumbar region: Secondary | ICD-10-CM | POA: Diagnosis not present

## 2020-05-17 ENCOUNTER — Ambulatory Visit
Admission: RE | Admit: 2020-05-17 | Discharge: 2020-05-17 | Disposition: A | Discharge status: Home / Self Care | Payer: Federal, State, Local not specified - PPO | Source: Ambulatory Visit | Attending: Orthopedic Surgery | Admitting: Orthopedic Surgery

## 2020-05-17 DIAGNOSIS — M6530 Trigger finger, unspecified finger: Secondary | ICD-10-CM

## 2020-05-17 DIAGNOSIS — M24841 Other specific joint derangements of right hand, not elsewhere classified: Secondary | ICD-10-CM | POA: Diagnosis not present

## 2020-05-21 DIAGNOSIS — M65331 Trigger finger, right middle finger: Secondary | ICD-10-CM | POA: Diagnosis not present

## 2020-05-21 DIAGNOSIS — M5416 Radiculopathy, lumbar region: Secondary | ICD-10-CM | POA: Diagnosis not present

## 2020-05-21 DIAGNOSIS — M6530 Trigger finger, unspecified finger: Secondary | ICD-10-CM | POA: Diagnosis not present

## 2020-05-21 DIAGNOSIS — M461 Sacroiliitis, not elsewhere classified: Secondary | ICD-10-CM | POA: Diagnosis not present

## 2020-05-21 DIAGNOSIS — M791 Myalgia, unspecified site: Secondary | ICD-10-CM | POA: Diagnosis not present

## 2020-05-25 ENCOUNTER — Other Ambulatory Visit: Payer: Self-pay

## 2020-05-25 ENCOUNTER — Emergency Department (HOSPITAL_COMMUNITY): Payer: Federal, State, Local not specified - PPO

## 2020-05-25 ENCOUNTER — Emergency Department (HOSPITAL_COMMUNITY)
Admission: EM | Admit: 2020-05-25 | Discharge: 2020-05-25 | Disposition: A | Discharge status: Home / Self Care | Payer: Federal, State, Local not specified - PPO | Attending: Emergency Medicine | Admitting: Emergency Medicine

## 2020-05-25 DIAGNOSIS — Z7982 Long term (current) use of aspirin: Secondary | ICD-10-CM | POA: Insufficient documentation

## 2020-05-25 DIAGNOSIS — Z7951 Long term (current) use of inhaled steroids: Secondary | ICD-10-CM | POA: Diagnosis not present

## 2020-05-25 DIAGNOSIS — M549 Dorsalgia, unspecified: Secondary | ICD-10-CM | POA: Diagnosis not present

## 2020-05-25 DIAGNOSIS — Z79899 Other long term (current) drug therapy: Secondary | ICD-10-CM | POA: Insufficient documentation

## 2020-05-25 DIAGNOSIS — Z91040 Latex allergy status: Secondary | ICD-10-CM | POA: Insufficient documentation

## 2020-05-25 DIAGNOSIS — J45909 Unspecified asthma, uncomplicated: Secondary | ICD-10-CM | POA: Diagnosis not present

## 2020-05-25 DIAGNOSIS — I10 Essential (primary) hypertension: Secondary | ICD-10-CM | POA: Diagnosis not present

## 2020-05-25 DIAGNOSIS — M25512 Pain in left shoulder: Secondary | ICD-10-CM | POA: Diagnosis not present

## 2020-05-25 DIAGNOSIS — R079 Chest pain, unspecified: Secondary | ICD-10-CM | POA: Diagnosis not present

## 2020-05-25 DIAGNOSIS — M546 Pain in thoracic spine: Secondary | ICD-10-CM | POA: Diagnosis not present

## 2020-05-25 MED ORDER — DICLOFENAC SODIUM 1 % EX GEL: 2.0000 g | Freq: Four times a day (QID) | CUTANEOUS | 0 refills | Status: DC | PRN

## 2020-05-25 MED ORDER — DIAZEPAM 5 MG PO TABS: ORAL_TABLET | ORAL | 0 refills | Status: DC

## 2020-05-25 NOTE — ED Provider Notes (Signed)
[facility]-EMERGENCY DEPT Provider Note   CSN: [id number] Arrival date & time: 05/25/20  7829     History Chief Complaint  Patient presents with  . Back Pain    Ashley [name] is a 64 y.o. female.  The history is provided by the patient and medical records. No language interpreter was used.  Back Pain    64 year old female with history of fibromyalgia, scoliosis, arthritis, recurrent back pain presenting for evaluation of back pain.  Patient reports she was awoke this morning with pain to her left shoulder blade.  Pain is sharp, persistent, worse with movement of her arm and movement of her neck.  Pain is moderate in severity.  No associated lightheadedness or dizziness but states when she takes a deep breath it does hurt.  She does not complain of shortness of breath no chest pain no abdominal pain no nausea vomiting diarrhea or rash.  She denies any heavy lifting or strenuous activities.  She admits she has history of fibromyalgias as well as having chronic lower back pain but this pain felt different.  She felt fine yesterday.  Past Medical History:  Diagnosis Date  . Arthritis   . Depression   . Fibromyalgia 09/04/2014  . GERD (gastroesophageal reflux disease)   . Headache   . Hypertension   . Lumbar radicular pain 09/04/2014  . Pre-diabetes   . Sacroiliac joint pain    nonunion of left SI joint  . Spinal headache   . TIA (transient ischemic attack)   . Wears glasses     Patient Active Problem List   Diagnosis Date Noted  . De Quervain's tenosynovitis 06/22/2019  . Trigger middle finger of right hand 04/27/2019  . Stiffness of finger joint, right 04/27/2019  . Xerostomia 02/23/2019  . Dry eyes 02/23/2019  . Family history of diabetes mellitus 01/26/2019  . Family history of ischemic heart disease (IHD) 01/26/2019  . Family history of stroke 01/26/2019  . Hiatal hernia 01/26/2019  . Knee pain, left 01/26/2019  . Sacroiliac joint  dysfunction 08/05/2018  . Spondylosis of lumbar region without myelopathy or radiculopathy 05/16/2016  . Generalized headaches 04/21/2016  . History of migraine 04/21/2016  . Menopause 04/09/2016  . Benign hypertension 04/09/2016  . Depressive disorder 04/09/2016  . Disorder of thyroid gland 04/09/2016  . Dysphagia 04/09/2016  . Chronic low back pain with left-sided sciatica 03/24/2016  . Intrinsic asthma 09/06/2014  . GERD (gastroesophageal reflux disease) 09/06/2014  . Allergic rhinitis 09/06/2014  . Lumbar radicular pain 09/04/2014  . Fibromyalgia 09/04/2014  . Back pain 06/01/2013  . Cubital tunnel syndrome 06/01/2013  . Muscle spasms of neck 06/01/2013  . Myofascial pain 06/01/2013  . Neuropathic pain 06/01/2013  . Right wrist pain 06/01/2013  . Shoulder pain 06/01/2013  . Primary localized osteoarthrosis, lower leg 12/08/2012  . Encounter for routine gynecological examination 11/16/2012  . Borderline hypertension 06/16/2012  . Schatzki's ring 05/21/2012  . G6PD deficiency 04/14/2012  . Lumbar post-laminectomy syndrome 08/26/2011  . SOB (shortness of breath) 04/27/2009    Past Surgical History:  Procedure Laterality Date  . ABDOMINAL HYSTERECTOMY    . BREAST LUMPECTOMY Left 1985  . BREAST SURGERY    . BUNIONECTOMY    . COLONOSCOPY    . DILATION AND CURETTAGE OF UTERUS    . ESOPHAGEAL MANOMETRY [name] 11/21/2013   Procedure: ESOPHAGEAL MANOMETRY (EM);  Surgeon: Shirley Friar, MD;  Location: [facility];  Service: Endoscopy;  Laterality: [name];  . KNEE ARTHROSCOPY  W/ MENISCAL REPAIR    . LUMBAR LAMINECTOMY    . MULTIPLE TOOTH EXTRACTIONS    . NASAL SINUS SURGERY    . SACROILIAC JOINT FUSION Left 08/05/2018   Procedure: REVISION LEFT SACROILIAC JOINT FUSION WITH INSTRUMENTATION AND ALLOGRAFT;  Surgeon: Estill Bamberg, MD;  Location: [facility] OR;  Service: Orthopedics;  Laterality: Left;  . TONSILLECTOMY       OB History   No obstetric history on file.     Family  History  Problem Relation Age of Onset  . Aneurysm Father   . Bipolar disorder Brother   . Diabetes Brother   . Migraines Mother   . Eczema Sister   . Urticaria Sister   . Migraines Daughter   . Urticaria Daughter   . Asthma Daughter   . Cancer Other   . Hypertension Other   . Diabetes Other   . Allergic rhinitis Neg Hx   . Angioedema Neg Hx   . Immunodeficiency Neg Hx     Social History   Tobacco Use  . Smoking status: Former Smoker    Packs/day: 0.75    Years: 24.00    Pack years: 18.00    Types: Cigarettes    Quit date: 11/03/1992    Years since quitting: 27.5  . Smokeless tobacco: Never Used  Vaping Use  . Vaping Use: Never used  Substance Use Topics  . Alcohol use: Yes    Alcohol/week: 1.0 standard drink    Types: 1 Glasses of wine per week    Comment: occasionally  . Drug use: No    Home Medications Prior to Admission medications   Medication Sig Start Date End Date Taking? Authorizing Provider  acetaminophen-codeine (TYLENOL #3) 300-30 MG tablet Take 1 tablet by mouth every 6 (six) hours as needed (for pain).     [provider]  albuterol (PROVENTIL HFA;VENTOLIN HFA) 108 (90 Base) MCG/ACT inhaler Inhale 1-2 puffs into the lungs every 6 (six) hours as needed for wheezing or shortness of breath. Patient taking differently: Inhale 2 puffs into the lungs every 4 (four) hours as needed for wheezing or shortness of breath.  07/28/17   Leslye Peer, MD  albuterol (PROVENTIL) (2.5 MG/3ML) 0.083% nebulizer solution USE 1 VIAL VIA NEBULIZER EVERY 4 HOURS AS NEEDED FOR WHEEZING OR SHORTNESS OF BREATH Patient taking differently: Take 2.5 mg by nebulization every 6 (six) hours as needed for wheezing or shortness of breath.  07/07/17   Leslye Peer, MD  atorvastatin (LIPITOR) 20 MG tablet  02/17/19   [provider]  azelastine (OPTIVAR) 0.05 % ophthalmic solution Place 1 drop into the left eye 2 (two) times daily.    [provider]  baclofen  (LIORESAL) 20 MG tablet Take 1 tablet (20 mg total) by mouth 3 (three) times daily. 07/01/15   Trixie Dredge, PA-C  beclomethasone (QVAR REDIHALER) 80 MCG/ACT inhaler Inhale 2 puffs into the lungs 2 (two) times daily. 08/03/18   Leslye Peer, MD  benzonatate (TESSALON) 100 MG capsule Take 100 mg by mouth 3 (three) times daily as needed for cough.    [provider]  budesonide-formoterol (SYMBICORT) 160-4.5 MCG/ACT inhaler Inhale 2 puffs into the lungs 2 (two) times daily. 02/16/18   Lupita Leash, MD  desloratadine (CLARINEX) 5 MG tablet Take 5 mg by mouth daily as needed (allergies).     [provider]  diazepam (VALIUM) 5 MG tablet as needed. 12/31/16   [provider]  DULoxetine (CYMBALTA) 60  MG capsule Take 60 mg by mouth daily.     [provider]  fexofenadine (ALLEGRA) 180 MG tablet Take by mouth. 03/10/12   [provider]  fluticasone (FLONASE) 50 MCG/ACT nasal spray Place 2 sprays into both nostrils 2 (two) times daily. 08/27/17   Leslye Peer, MD  gabapentin (NEURONTIN) 800 MG tablet Take 1 tablet (800 mg total) by mouth 3 (three) times daily. 07/07/17   Kirsteins, Victorino Sparrow, MD  hydrochlorothiazide (HYDRODIURIL) 12.5 MG tablet Take 12.5 mg by mouth 2 (two) times daily. 07/23/18   [provider]  HYDROcodone-acetaminophen (NORCO) 10-325 MG tablet Take 1 tablet by mouth every 8 (eight) hours as needed. 01/26/19   Lenn Sink, DPM  LORazepam (ATIVAN) 1 MG tablet Take 0.5-1 mg by mouth 2 (two) times daily as needed (stress/elevated blood pressure).    [provider]  losartan (COZAAR) 25 MG tablet Take 25 mg by mouth 2 (two) times daily. 07/23/18   [provider]  MAGNESIUM CHLORIDE-CALCIUM PO Take 1 tablet by mouth daily at 3 pm.    [provider]  methocarbamol (ROBAXIN) 500 MG tablet Take 500 mg by mouth every 8 (eight) hours as needed for muscle spasms.    [provider]  montelukast  (SINGULAIR) 10 MG tablet Take 1 tablet (10 mg total) by mouth at bedtime. 04/27/19   Leslye Peer, MD  Multiple Vitamin (MULTIVITAMIN WITH MINERALS) TABS tablet Take 1 tablet by mouth daily at 3 pm. Centrum for Women    [provider]  multivitamin (VIT W/EXTRA C) CHEW chewable tablet Chew by mouth.    [provider]  naproxen (EC NAPROSYN) 500 MG EC tablet Take 1 tablet twice a day with food 02/23/13   [provider]  naproxen (NAPROSYN) 500 MG tablet Take 500 mg by mouth 2 (two) times daily as needed. 02/01/19   [provider]  neomycin-polymyxin-hydrocortisone (CORTISPORIN) OTIC solution Apply 1-2 drops to toe after soaking twice a day 01/26/19   Lenn Sink, DPM  nortriptyline (PAMELOR) 25 MG capsule Take 1 capsule (25 mg total) by mouth at bedtime. 04/03/17   Nilda Riggs, NP  ondansetron (ZOFRAN) 4 MG tablet Take 1 tablet (4 mg total) by mouth every 8 (eight) hours as needed for nausea or vomiting. 04/21/16   Nilda Riggs, NP  oxyCODONE-acetaminophen (PERCOCET/ROXICET) 5-325 MG tablet  10/19/18   [provider]  pantoprazole (PROTONIX) 40 MG tablet Take 40 mg by mouth 2 (two) times daily.     [provider]  PARoxetine Mesylate 7.5 MG CAPS  03/15/19   [provider]  prednisoLONE acetate (PRED FORTE) 1 % ophthalmic suspension  07/04/19   [provider]  SUPER B COMPLEX/C PO Take 1 capsule by mouth daily at 3 pm.    [provider]  traZODone (DESYREL) 50 MG tablet TAKE 1 TABLET BY MOUTH AT BEDTIME AS NEEDED 06/14/19   [provider]  TURMERIC PO Take 1 capsule by mouth daily at 3 pm.    [provider]  Turmeric POWD Take by mouth.    [provider]  Vitamins-Lipotropics (COMPLEX B-100) TBCR Take by mouth.    [provider]    Allergies    Latex, Sulfa antibiotics, Aspirin, Other, Oxycodone, and Tramadol  Review of Systems   Review of Systems    Musculoskeletal: Positive for back pain.  All other systems reviewed and are negative.   Physical Exam Updated Vital Signs  BP (!) 148/98 (BP Location: Left Arm)   Pulse 79   Temp 98.3 F (36.8 C) (Oral)   Resp 16   Ht 5\' 9"  (1.753 m)   Wt (!) 97.5 kg   SpO2 99%   BMI 31.75 kg/m   Physical Exam Vitals and nursing note reviewed.  Constitutional:      General: She is not in acute distress.    Appearance: She is well-developed. She is obese.  HENT:     Head: Atraumatic.  Eyes:     Conjunctiva/sclera: Conjunctivae normal.  Cardiovascular:     Rate and Rhythm: Normal rate and regular rhythm.     Pulses: Normal pulses.     Heart sounds: Normal heart sounds.  Pulmonary:     Effort: Pulmonary effort is normal.     Breath sounds: Normal breath sounds.  Abdominal:     Palpations: Abdomen is soft.     Tenderness: There is no abdominal tenderness.     Comments: Abdomen is soft and nontender.  No guarding, no rebound tenderness.  Musculoskeletal:        General: Tenderness (Tenderness to left paracervical spinal region as well as the medial aspects of left scapula with increasing pain from left shoulder raise.  No deformity.) present.     Cervical back: Neck supple.     Comments: Radial pulse 2+ bilaterally.  Skin:    Capillary Refill: Capillary refill takes less than 2 seconds.     Findings: No rash.  Neurological:     Mental Status: She is alert and oriented to person, place, and time.  Psychiatric:        Mood and Affect: Mood normal.     ED Results / Procedures / Treatments   Labs (all labs ordered are listed, but only abnormal results are displayed) Labs Reviewed - No data to display  EKG None  Radiology DG Chest 2 View  Result Date: 05/25/2020 CLINICAL DATA:  Pain. Additional provided: Patient reports extreme left shoulder pain, limited range of motion in neck and pain upon movement, patient reports history of fibromyalgia, scoliosis, arthritis. EXAM: CHEST - 2  VIEW COMPARISON:  Prior chest radiographs 12/22/2017 and earlier FINDINGS: Heart size within normal limits. No appreciable airspace consolidation or pulmonary edema. No evidence of pleural effusion or pneumothorax. Redemonstrated thoracic dextrocurvature. No acute bony abnormality identified IMPRESSION: No evidence of active cardiopulmonary disease. Redemonstrated thoracic dextrocurvature. Electronically Signed   By: Jackey LogeKyle  Golden DO   On: 05/25/2020 13:23    Procedures Procedures (including critical care time)  Medications Ordered in ED Medications - No data to display  ED Course  I have reviewed the triage vital signs and the nursing notes.  Pertinent labs & imaging results that were available during my care of the patient were reviewed by me and considered in my medical decision making (see chart for details).    MDM Rules/Calculators/A&P                          BP (!) 148/98 (BP Location: Left Arm)   Pulse 79   Temp 98.3 F (36.8 C) (Oral)   Resp 16   Ht 5\' 9"  (1.753 m)   Wt (!) 97.5 kg   SpO2 99%   BMI 31.75 kg/m   Final Clinical Impression(s) / ED Diagnoses Final diagnoses:  Upper back pain on left side    Rx / DC Orders ED Discharge Orders  Ordered    diazepam (VALIUM) 5 MG tablet     Discontinue  Reprint     05/25/20 1350    diclofenac Sodium (VOLTAREN) 1 % GEL  4 times daily PRN     Discontinue  Reprint     05/25/20 1351         12:57 PM Patient here with complaints of pain to her left shoulder blade that started this morning.  Pain is reproducible on exam.  She has a benign abdominal exam therefore I have low suspicion for referral pain or diaphragmatic pain.  She is neurovascular intact.  Will perform screening chest x-ray.  Low suspicion for PE.  She has not hypoxic and not tachycardic.   Fayrene Helper, PA-C 05/25/20 1353    Geoffery Lyons, MD 05/25/20 [phone]

## 2020-05-25 NOTE — Discharge Instructions (Signed)
You may try valium as needed for your discomfort.  Apply voltaren gel up to 4 times daily to affected area for pain relief.  Follow up with your doctor for further care.  Return if you have any concerns.

## 2020-05-25 NOTE — ED Triage Notes (Signed)
Patient reports spine pain that is making her have trouble moving her upper torso and pain with movement. Patient reports she has fibromyalgia, scoliosis and arthritis but that her current pain medication is not touching it. Patient reports the pain woke her out of her sleep.

## 2020-05-25 NOTE — ED Notes (Signed)
Pt verbalizes understanding of DC instructions. Pt belongings returned and is ambulatory out of ED.  

## 2020-05-28 DIAGNOSIS — M5416 Radiculopathy, lumbar region: Secondary | ICD-10-CM | POA: Diagnosis not present

## 2020-05-28 DIAGNOSIS — M461 Sacroiliitis, not elsewhere classified: Secondary | ICD-10-CM | POA: Diagnosis not present

## 2020-05-28 DIAGNOSIS — M791 Myalgia, unspecified site: Secondary | ICD-10-CM | POA: Diagnosis not present

## 2020-06-11 DIAGNOSIS — M25661 Stiffness of right knee, not elsewhere classified: Secondary | ICD-10-CM | POA: Diagnosis not present

## 2020-06-11 DIAGNOSIS — Z96651 Presence of right artificial knee joint: Secondary | ICD-10-CM | POA: Diagnosis not present

## 2020-06-12 DIAGNOSIS — M5416 Radiculopathy, lumbar region: Secondary | ICD-10-CM | POA: Diagnosis not present

## 2020-06-12 DIAGNOSIS — M461 Sacroiliitis, not elsewhere classified: Secondary | ICD-10-CM | POA: Diagnosis not present

## 2020-06-12 DIAGNOSIS — M65331 Trigger finger, right middle finger: Secondary | ICD-10-CM | POA: Diagnosis not present

## 2020-06-12 DIAGNOSIS — M791 Myalgia, unspecified site: Secondary | ICD-10-CM | POA: Diagnosis not present

## 2020-06-12 DIAGNOSIS — M6289 Other specified disorders of muscle: Secondary | ICD-10-CM | POA: Diagnosis not present

## 2020-06-12 DIAGNOSIS — M25641 Stiffness of right hand, not elsewhere classified: Secondary | ICD-10-CM | POA: Diagnosis not present

## 2020-06-14 DIAGNOSIS — M25641 Stiffness of right hand, not elsewhere classified: Secondary | ICD-10-CM | POA: Diagnosis not present

## 2020-06-14 DIAGNOSIS — M65331 Trigger finger, right middle finger: Secondary | ICD-10-CM | POA: Diagnosis not present

## 2020-06-14 DIAGNOSIS — M6289 Other specified disorders of muscle: Secondary | ICD-10-CM | POA: Diagnosis not present

## 2020-06-18 DIAGNOSIS — M791 Myalgia, unspecified site: Secondary | ICD-10-CM | POA: Diagnosis not present

## 2020-06-18 DIAGNOSIS — M461 Sacroiliitis, not elsewhere classified: Secondary | ICD-10-CM | POA: Diagnosis not present

## 2020-06-18 DIAGNOSIS — M5416 Radiculopathy, lumbar region: Secondary | ICD-10-CM | POA: Diagnosis not present

## 2020-06-25 DIAGNOSIS — M791 Myalgia, unspecified site: Secondary | ICD-10-CM | POA: Diagnosis not present

## 2020-06-25 DIAGNOSIS — M5416 Radiculopathy, lumbar region: Secondary | ICD-10-CM | POA: Diagnosis not present

## 2020-06-25 DIAGNOSIS — M461 Sacroiliitis, not elsewhere classified: Secondary | ICD-10-CM | POA: Diagnosis not present

## 2020-06-26 ENCOUNTER — Other Ambulatory Visit: Payer: Self-pay

## 2020-06-26 ENCOUNTER — Ambulatory Visit: Payer: Federal, State, Local not specified - PPO | Admitting: Primary Care

## 2020-06-26 ENCOUNTER — Encounter: Payer: Self-pay | Admitting: Primary Care

## 2020-06-26 DIAGNOSIS — J45909 Unspecified asthma, uncomplicated: Secondary | ICD-10-CM

## 2020-06-26 MED ORDER — AMOXICILLIN-POT CLAVULANATE 875-125 MG PO TABS: 1.0000 | ORAL_TABLET | Freq: Two times a day (BID) | ORAL | 0 refills | Status: DC

## 2020-06-26 MED ORDER — BUDESONIDE-FORMOTEROL FUMARATE 80-4.5 MCG/ACT IN AERO
2.0000 | INHALATION_SPRAY | Freq: Two times a day (BID) | RESPIRATORY_TRACT | 5 refills | Status: DC
Start: 2020-06-26 — End: 2021-05-28

## 2020-06-26 NOTE — Patient Instructions (Addendum)
Nice meeting you today [name]  Recommendations: - Resume Symbicort 80 2 puffs twice daily (if doing well you can decrease to 1 puff twice daily) - Continue Flonase, Allegra and Singulair  Rx: - Augmentin 1 tab twice daily x 7 days   Follow-up: - 6 months with Dr. Delton [name] or sooner if symptoms do not improve or worsen    Asthma, Adult  Asthma is a long-term (chronic) condition in which the airways get tight and narrow. The airways are the breathing passages that lead from the nose and mouth down into the lungs. A person with asthma will have times when symptoms get worse. These are called asthma attacks. They can cause coughing, whistling sounds when you breathe (wheezing), shortness of breath, and chest pain. They can make it hard to breathe. There is no cure for asthma, but medicines and lifestyle changes can help control it. There are many things that can bring on an asthma attack or make asthma symptoms worse (triggers). Common triggers include:  Mold.  Dust.  Cigarette smoke.  Cockroaches.  Things that can cause allergy symptoms (allergens). These include animal skin flakes (dander) and pollen from trees or grass.  Things that pollute the air. These may include household cleaners, wood smoke, smog, or chemical odors.  Cold air, weather changes, and wind.  Crying or laughing hard.  Stress.  Certain medicines or drugs.  Certain foods such as dried fruit, potato chips, and grape juice.  Infections, such as a cold or the flu.  Certain medical conditions or diseases.  Exercise or tiring activities. Asthma may be treated with medicines and by staying away from the things that cause asthma attacks. Types of medicines may include:  Controller medicines. These help prevent asthma symptoms. They are usually taken every day.  Fast-acting reliever or rescue medicines. These quickly relieve asthma symptoms. They are used as needed and provide short-term  relief.  Allergy medicines if your attacks are brought on by allergens.  Medicines to help control the body's defense (immune) system. Follow these instructions at home: Avoiding triggers in your home  Change your heating and air conditioning filter often.  Limit your use of fireplaces and wood stoves.  Get rid of pests (such as roaches and mice) and their droppings.  Throw away plants if you see mold on them.  Clean your floors. Dust regularly. Use cleaning products that do not smell.  Have someone vacuum when you are not home. Use a vacuum cleaner with a HEPA filter if possible.  Replace carpet with wood, tile, or vinyl flooring. Carpet can trap animal skin flakes and dust.  Use allergy-proof pillows, mattress covers, and box spring covers.  Wash bed sheets and blankets every week in hot water. Dry them in a dryer.  Keep your bedroom free of any triggers.  Avoid pets and keep windows closed when things that cause allergy symptoms are in the air.  Use blankets that are made of polyester or cotton.  Clean bathrooms and kitchens with bleach. If possible, have someone repaint the walls in these rooms with mold-resistant paint. Keep out of the rooms that are being cleaned and painted.  Wash your hands often with soap and water. If soap and water are not available, use hand sanitizer.  Do not allow anyone to smoke in your home. General instructions  Take over-the-counter and prescription medicines only as told by your doctor. ? Talk with your doctor if you have questions about how or when to take your medicines. ?  Make note if you need to use your medicines more often than usual.  Do not use any products that contain nicotine or tobacco, such as cigarettes and e-cigarettes. If you need help quitting, ask your doctor.  Stay away from secondhand smoke.  Avoid doing things outdoors when allergen counts are high and when air quality is low.  Wear a ski mask when doing outdoor  activities in the winter. The mask should cover your nose and mouth. Exercise indoors on cold days if you can.  Warm up before you exercise. Take time to cool down after exercise.  Use a peak flow meter as told by your doctor. A peak flow meter is a tool that measures how well the lungs are working.  Keep track of the peak flow meter's readings. Write them down.  Follow your asthma action plan. This is a written plan for taking care of your asthma and treating your attacks.  Make sure you get all the shots (vaccines) that your doctor recommends. Ask your doctor about a flu shot and a pneumonia shot.  Keep all follow-up visits as told by your doctor. This is important. Contact a doctor if:  You have wheezing, shortness of breath, or a cough even while taking medicine to prevent attacks.  The mucus you cough up (sputum) is thicker than usual.  The mucus you cough up changes from clear or white to yellow, green, gray, or bloody.  You have problems from the medicine you are taking, such as: ? A rash. ? Itching. ? Swelling. ? Trouble breathing.  You need reliever medicines more than 2-3 times a week.  Your peak flow reading is still at 50-79% of your personal best after following the action plan for 1 hour.  You have a fever. Get help right away if:  You seem to be worse and are not responding to medicine during an asthma attack.  You are short of breath even at rest.  You get short of breath when doing very little activity.  You have trouble eating, drinking, or talking.  You have chest pain or tightness.  You have a fast heartbeat.  Your lips or fingernails start to turn blue.  You are light-headed or dizzy, or you faint.  Your peak flow is less than 50% of your personal best.  You feel too tired to breathe normally. Summary  Asthma is a long-term (chronic) condition in which the airways get tight and narrow. An asthma attack can make it hard to breathe.  Asthma  cannot be cured, but medicines and lifestyle changes can help control it.  Make sure you understand how to avoid triggers and how and when to use your medicines. This information is not intended to replace advice given to you by your health care provider. Make sure you discuss any questions you have with your health care provider. Document Revised: 12/23/2018 Document Reviewed: 11/24/2016 Elsevier Patient Education  2020 [employer].

## 2020-06-26 NOTE — Progress Notes (Signed)
@Patient  ID: Ashley [name], female    DOB: [birth year], 64 y.o.   MRN: [id number]  Chief Complaint  Patient presents with  . Acute Visit    Referring provider: Henrine Screwshacker, Robert, MD  HPI: 64 year old female, former smoker. PMH significant for HTN, intrinsic asthma, GERD, scatzki's ring, chronic lower back pain. Former patient of Dr. Kendrick FriesMcQuaid, last seen by Dr. Delton CoombesByrum in June 2019. Maintained on Symbicort.   Previous [facility] pulmonary encounter: 04/13/18- Dr. Delton CoombesByrum She complains of cough and shortness of breath. There is no wheezing. Associated symptoms include headaches. Pertinent negatives include no ear pain, fever, postnasal drip, rhinorrhea, sneezing, sore throat or trouble swallowing. Her past medical history is significant for asthma.   64 yo woman, former smoker (20 pk-yrs), hx HTN, Allergies, GERD. Has been given dx of asthma associated with her GERD.           ROV 04/13/18 --patient is a 64 year old woman with former tobacco and asthma.  Her asthma is impacted by both GERD and allergic rhinitis.  She was seen in April by Dr. Kendrick FriesMcQuaid at which time she had a significant increase in her allergic rhinitis symptoms that seem to result in some increased dyspnea and asthma symptoms as well.  She had cetirizine added to her Singulair and Flonase.  It was also discovered that she was using tap water for her nasal rinses and it was recommended that she change to distilled water.  Her Qvar was changed to Symbicort to manage the increased asthma symptoms.  She returns today for follow-up. She also notes that she had been started on lisinopril in April, then her PCP took her off lisinopril in early May, which has also helped. She tells me that she got the Prevnar-13 at Dr Recardo Evangelisthacker's office last Fall.                                                                06/26/2020- Interim hx Patient presents today for acute sick visit. She has not been seen since 2019. She reports  Increased shortness  of breath and cough x4 months. Cough is productive with thick yellow mucus. She lost her husband to hit and run last August and she is her mothers sole care giver. She has not been takign Qvar as prescribed. During last visit in 2019 she was given trial of Symbicort. Her breathing was better when she was in [place]. Symptoms are worse [place] here in [place] since she returned in April.   Allergies  Allergen Reactions  . Latex Itching, Swelling and Other (See Comments)    redness  . Sulfa Antibiotics Diarrhea and Nausea And Vomiting  . Aspirin Other (See Comments)    Can cause her to bleed out due to G6 PD  . Other Other (See Comments)  . Oxycodone Itching    "all over"  . Tramadol Other (See Comments)    Makes her feel "spaced out on street drugs"    Immunization History  Administered Date(s) Administered  . Influenza Split 09/03/2014  . Influenza,inj,Quad PF,6+ Mos 08/06/2015, 08/27/2019  . Influenza-Unspecified 08/11/2017  . PFIZER SARS-COV-2 Vaccination 02/16/2020, 03/08/2020  . Pneumococcal Polysaccharide-23 08/06/2015  . Tdap 04/16/2010    Past Medical History:  Diagnosis Date  . Arthritis   .  Depression   . Fibromyalgia 09/04/2014  . GERD (gastroesophageal reflux disease)   . Headache   . Hypertension   . Lumbar radicular pain 09/04/2014  . Pre-diabetes   . Sacroiliac joint pain    nonunion of left SI joint  . Spinal headache   . TIA (transient ischemic attack)   . Wears glasses     Tobacco History: Social History   Tobacco Use  Smoking Status Former Smoker  . Packs/day: 0.75  . Years: 24.00  . Pack years: 18.00  . Types: Cigarettes  . Quit date: 11/03/1992  . Years since quitting: 27.6  Smokeless Tobacco Never Used   Counseling given: Not Answered   Outpatient Medications Prior to Visit  Medication Sig Dispense Refill  . albuterol (PROVENTIL HFA;VENTOLIN HFA) 108 (90 Base) MCG/ACT inhaler Inhale 1-2 puffs into the lungs every 6 (six) hours as needed  for wheezing or shortness of breath. (Patient taking differently: Inhale 2 puffs into the lungs every 4 (four) hours as needed for wheezing or shortness of breath. ) 16 g 6  . albuterol (PROVENTIL) (2.5 MG/3ML) 0.083% nebulizer solution USE 1 VIAL VIA NEBULIZER EVERY 4 HOURS AS NEEDED FOR WHEEZING OR SHORTNESS OF BREATH (Patient taking differently: Take 2.5 mg by nebulization every 6 (six) hours as needed for wheezing or shortness of breath. ) 150 mL 2  . atorvastatin (LIPITOR) 20 MG tablet     . azelastine (OPTIVAR) 0.05 % ophthalmic solution Place 1 drop into the left eye 2 (two) times daily.    . baclofen (LIORESAL) 20 MG tablet Take 1 tablet (20 mg total) by mouth 3 (three) times daily. 15 each 0  . benzonatate (TESSALON) 100 MG capsule Take 100 mg by mouth 3 (three) times daily as needed for cough.    . desloratadine (CLARINEX) 5 MG tablet Take 5 mg by mouth daily as needed (allergies).     . diazepam (VALIUM) 5 MG tablet as needed. 2 tablet 0  . diclofenac Sodium (VOLTAREN) 1 % GEL Apply 2 g topically 4 (four) times daily as needed (apply to affected area as needed for pain). 100 g 0  . DULoxetine (CYMBALTA) 60 MG capsule Take 60 mg by mouth daily.     . fexofenadine (ALLEGRA) 180 MG tablet Take by mouth.    . fluticasone (FLONASE) 50 MCG/ACT nasal spray Place 2 sprays into both nostrils 2 (two) times daily. 48 g 3  . gabapentin (NEURONTIN) 800 MG tablet Take 1 tablet (800 mg total) by mouth 3 (three) times daily. 90 tablet 5  . hydrochlorothiazide (HYDRODIURIL) 12.5 MG tablet Take 12.5 mg by mouth 2 (two) times daily.  1  . HYDROcodone-acetaminophen (NORCO) 10-325 MG tablet Take 1 tablet by mouth every 8 (eight) hours as needed. 30 tablet 0  . LORazepam (ATIVAN) 1 MG tablet Take 0.5-1 mg by mouth 2 (two) times daily as needed (stress/elevated blood pressure).    Marland Kitchen losartan (COZAAR) 25 MG tablet Take 25 mg by mouth 2 (two) times daily.  1  . MAGNESIUM CHLORIDE-CALCIUM PO Take 1 tablet by mouth  daily at 3 pm.    . montelukast (SINGULAIR) 10 MG tablet Take 1 tablet (10 mg total) by mouth at bedtime. 90 tablet 0  . Multiple Vitamin (MULTIVITAMIN WITH MINERALS) TABS tablet Take 1 tablet by mouth daily at 3 pm. Centrum for Women    . multivitamin (VIT W/EXTRA C) CHEW chewable tablet Chew by mouth.    . naproxen (NAPROSYN) 500 MG tablet Take  500 mg by mouth 2 (two) times daily as needed.    . nortriptyline (PAMELOR) 25 MG capsule Take 1 capsule (25 mg total) by mouth at bedtime. 30 capsule 0  . ondansetron (ZOFRAN) 4 MG tablet Take 1 tablet (4 mg total) by mouth every 8 (eight) hours as needed for nausea or vomiting. 20 tablet 1  . oxyCODONE-acetaminophen (PERCOCET/ROXICET) 5-325 MG tablet     . pantoprazole (PROTONIX) 40 MG tablet Take 40 mg by mouth 2 (two) times daily.     . SUPER B COMPLEX/C PO Take 1 capsule by mouth daily at 3 pm.    . traZODone (DESYREL) 50 MG tablet TAKE 1 TABLET BY MOUTH AT BEDTIME AS NEEDED    . TURMERIC PO Take 1 capsule by mouth daily at 3 pm.    . Turmeric POWD Take by mouth.    . Vitamins-Lipotropics (COMPLEX B-100) TBCR Take by mouth.    . beclomethasone (QVAR REDIHALER) 80 MCG/ACT inhaler Inhale 2 puffs into the lungs 2 (two) times daily. 1 Inhaler 6  . methocarbamol (ROBAXIN) 500 MG tablet Take 500 mg by mouth every 8 (eight) hours as needed for muscle spasms. (Patient not taking: Reported on 06/26/2020)    . naproxen (EC NAPROSYN) 500 MG EC tablet Take 1 tablet twice a day with food (Patient not taking: Reported on 06/26/2020)    . neomycin-polymyxin-hydrocortisone (CORTISPORIN) OTIC solution Apply 1-2 drops to toe after soaking twice a day (Patient not taking: Reported on 06/26/2020) 10 mL 0  . PARoxetine Mesylate 7.5 MG CAPS  (Patient not taking: Reported on 06/26/2020)    . prednisoLONE acetate (PRED FORTE) 1 % ophthalmic suspension  (Patient not taking: Reported on 06/26/2020)    . acetaminophen-codeine (TYLENOL #3) 300-30 MG tablet Take 1 tablet by mouth  every 6 (six) hours as needed (for pain).  (Patient not taking: Reported on 06/26/2020)    . budesonide-formoterol (SYMBICORT) 160-4.5 MCG/ACT inhaler Inhale 2 puffs into the lungs 2 (two) times daily. (Patient not taking: Reported on 06/26/2020) 1 Inhaler 5   No facility-administered medications prior to visit.    Review of Systems  Review of Systems  Constitutional: Negative.   Respiratory: Positive for cough and shortness of breath.   Cardiovascular: Negative.     Physical Exam  BP 126/70 (BP Location: Left Arm, Cuff Size: Normal)   Pulse 97   Temp (!) 97.3 F (36.3 C) (Temporal)   Ht 5\' 9"  (1.753 m)   Wt 224 lb (101.6 kg)   SpO2 97%   BMI 33.08 kg/m  Physical Exam Constitutional:      Appearance: Normal appearance.  HENT:     Head: Normocephalic and atraumatic.     Mouth/Throat:     Mouth: Mucous membranes are moist.     Pharynx: Oropharynx is clear.  Neck:     Comments: Left neck fullness Cardiovascular:     Rate and Rhythm: Normal rate and regular rhythm.  Pulmonary:     Effort: Pulmonary effort is normal.     Breath sounds: Normal breath sounds.  Skin:    General: Skin is warm and dry.  Neurological:     General: No focal deficit present.     Mental Status: She is alert and oriented to person, place, and time. Mental status is at baseline.  Psychiatric:        Mood and Affect: Mood normal.        Behavior: Behavior normal.        Thought  Content: Thought content normal.        Judgment: Judgment normal.      Lab Results:  CBC    Component Value Date/Time   WBC 4.0 08/05/2018 0923   RBC 4.59 08/05/2018 0923   HGB 12.7 08/05/2018 0923   HCT 40.5 08/05/2018 0923   PLT 212 08/05/2018 0923   MCV 88.2 08/05/2018 0923   MCH 27.7 08/05/2018 0923   MCHC 31.4 08/05/2018 0923   RDW 13.7 08/05/2018 0923   LYMPHSABS 1.6 08/05/2018 0923   MONOABS 0.4 08/05/2018 0923   EOSABS 0.2 08/05/2018 0923   BASOSABS 0.0 08/05/2018 0923    BMET    Component Value  Date/Time   NA 141 08/05/2018 0923   K 4.0 08/05/2018 0923   CL 103 08/05/2018 0923   CO2 26 08/05/2018 0923   GLUCOSE 109 (H) 08/05/2018 0923   BUN 9 08/05/2018 0923   CREATININE 0.90 08/05/2018 0923   CALCIUM 9.5 08/05/2018 0923   GFRNONAA >60 08/05/2018 0923   GFRAA >60 08/05/2018 0923    BNP No results found for: BNP  ProBNP No results found for: PROBNP  Imaging: No results found.   Assessment & Plan:   Asthmatic bronchitis - Increased shortness of breath and productive cough with thick yellow sputum since returning to [facility] in April 2021. Inconsistent use of Qvar.  - Resume Symbicort 80 two puffs twice daily (if doing well you can decrease to 1 puff twice daily) - Continue Flonase, Allegra and Singulair - Rx: Augmentin 1 tab twice daily x 7 days  - Follow-up: 6 months with Dr. Delton Coombes or sooner if symptoms do not improve or worsen      Glenford Bayley, NP 06/26/2020

## 2020-06-26 NOTE — Assessment & Plan Note (Signed)
-   Increased shortness of breath and productive cough with thick yellow sputum since returning to [facility] in April 2021. Inconsistent use of Qvar.  - Resume Symbicort 80 two puffs twice daily (if doing well you can decrease to 1 puff twice daily) - Continue Flonase, Allegra and Singulair - Rx: Augmentin 1 tab twice daily x 7 days  - Follow-up: 6 months with Ashley [name] or sooner if symptoms do not improve or worsen

## 2020-06-30 DIAGNOSIS — M545 Low back pain: Secondary | ICD-10-CM | POA: Diagnosis not present

## 2020-07-02 DIAGNOSIS — M5416 Radiculopathy, lumbar region: Secondary | ICD-10-CM | POA: Diagnosis not present

## 2020-07-02 DIAGNOSIS — M461 Sacroiliitis, not elsewhere classified: Secondary | ICD-10-CM | POA: Diagnosis not present

## 2020-07-02 DIAGNOSIS — M791 Myalgia, unspecified site: Secondary | ICD-10-CM | POA: Diagnosis not present

## 2020-07-04 DIAGNOSIS — M1812 Unilateral primary osteoarthritis of first carpometacarpal joint, left hand: Secondary | ICD-10-CM | POA: Diagnosis not present

## 2020-07-04 DIAGNOSIS — M25532 Pain in left wrist: Secondary | ICD-10-CM | POA: Diagnosis not present

## 2020-07-04 DIAGNOSIS — S63502A Unspecified sprain of left wrist, initial encounter: Secondary | ICD-10-CM | POA: Diagnosis not present

## 2020-07-04 DIAGNOSIS — M6530 Trigger finger, unspecified finger: Secondary | ICD-10-CM | POA: Diagnosis not present

## 2020-07-05 DIAGNOSIS — M65331 Trigger finger, right middle finger: Secondary | ICD-10-CM | POA: Diagnosis not present

## 2020-07-05 DIAGNOSIS — M6289 Other specified disorders of muscle: Secondary | ICD-10-CM | POA: Diagnosis not present

## 2020-07-05 DIAGNOSIS — M25641 Stiffness of right hand, not elsewhere classified: Secondary | ICD-10-CM | POA: Diagnosis not present

## 2020-07-06 DIAGNOSIS — M5416 Radiculopathy, lumbar region: Secondary | ICD-10-CM | POA: Diagnosis not present

## 2020-07-10 DIAGNOSIS — M791 Myalgia, unspecified site: Secondary | ICD-10-CM | POA: Diagnosis not present

## 2020-07-10 DIAGNOSIS — M5416 Radiculopathy, lumbar region: Secondary | ICD-10-CM | POA: Diagnosis not present

## 2020-07-10 DIAGNOSIS — G4733 Obstructive sleep apnea (adult) (pediatric): Secondary | ICD-10-CM | POA: Diagnosis not present

## 2020-07-10 DIAGNOSIS — M461 Sacroiliitis, not elsewhere classified: Secondary | ICD-10-CM | POA: Diagnosis not present

## 2020-07-11 DIAGNOSIS — M25641 Stiffness of right hand, not elsewhere classified: Secondary | ICD-10-CM | POA: Diagnosis not present

## 2020-07-11 DIAGNOSIS — M6289 Other specified disorders of muscle: Secondary | ICD-10-CM | POA: Diagnosis not present

## 2020-07-11 DIAGNOSIS — M65331 Trigger finger, right middle finger: Secondary | ICD-10-CM | POA: Diagnosis not present

## 2020-07-12 DIAGNOSIS — Z20822 Contact with and (suspected) exposure to covid-19: Secondary | ICD-10-CM | POA: Diagnosis not present

## 2020-07-13 DIAGNOSIS — R609 Edema, unspecified: Secondary | ICD-10-CM | POA: Diagnosis not present

## 2020-07-16 DIAGNOSIS — M791 Myalgia, unspecified site: Secondary | ICD-10-CM | POA: Diagnosis not present

## 2020-07-16 DIAGNOSIS — M461 Sacroiliitis, not elsewhere classified: Secondary | ICD-10-CM | POA: Diagnosis not present

## 2020-07-16 DIAGNOSIS — M5416 Radiculopathy, lumbar region: Secondary | ICD-10-CM | POA: Diagnosis not present

## 2020-07-17 ENCOUNTER — Other Ambulatory Visit: Payer: Self-pay | Admitting: Family Medicine

## 2020-07-17 DIAGNOSIS — R221 Localized swelling, mass and lump, neck: Secondary | ICD-10-CM

## 2020-07-20 ENCOUNTER — Other Ambulatory Visit: Payer: Self-pay | Admitting: Orthopedic Surgery

## 2020-07-23 ENCOUNTER — Other Ambulatory Visit: Payer: Self-pay | Admitting: Orthopedic Surgery

## 2020-07-23 DIAGNOSIS — M791 Myalgia, unspecified site: Secondary | ICD-10-CM | POA: Diagnosis not present

## 2020-07-23 DIAGNOSIS — M461 Sacroiliitis, not elsewhere classified: Secondary | ICD-10-CM | POA: Diagnosis not present

## 2020-07-23 DIAGNOSIS — M5416 Radiculopathy, lumbar region: Secondary | ICD-10-CM | POA: Diagnosis not present

## 2020-07-24 ENCOUNTER — Other Ambulatory Visit: Payer: Self-pay | Admitting: Orthopedic Surgery

## 2020-07-24 DIAGNOSIS — M545 Low back pain, unspecified: Secondary | ICD-10-CM

## 2020-07-30 ENCOUNTER — Inpatient Hospital Stay: Admission: RE | Admit: 2020-07-30 | Payer: Federal, State, Local not specified - PPO | Source: Ambulatory Visit

## 2020-07-31 ENCOUNTER — Ambulatory Visit
Admission: RE | Admit: 2020-07-31 | Discharge: 2020-07-31 | Disposition: A | Discharge status: Home / Self Care | Payer: Federal, State, Local not specified - PPO | Source: Ambulatory Visit | Attending: Family Medicine | Admitting: Family Medicine

## 2020-07-31 DIAGNOSIS — K118 Other diseases of salivary glands: Secondary | ICD-10-CM | POA: Diagnosis not present

## 2020-07-31 DIAGNOSIS — M791 Myalgia, unspecified site: Secondary | ICD-10-CM | POA: Diagnosis not present

## 2020-07-31 DIAGNOSIS — M5416 Radiculopathy, lumbar region: Secondary | ICD-10-CM | POA: Diagnosis not present

## 2020-07-31 DIAGNOSIS — R221 Localized swelling, mass and lump, neck: Secondary | ICD-10-CM

## 2020-07-31 DIAGNOSIS — C07 Malignant neoplasm of parotid gland: Secondary | ICD-10-CM | POA: Diagnosis not present

## 2020-07-31 DIAGNOSIS — R220 Localized swelling, mass and lump, head: Secondary | ICD-10-CM | POA: Diagnosis not present

## 2020-07-31 DIAGNOSIS — M461 Sacroiliitis, not elsewhere classified: Secondary | ICD-10-CM | POA: Diagnosis not present

## 2020-07-31 MED ORDER — IOPAMIDOL (ISOVUE-300) INJECTION 61%
75.0000 mL | Freq: Once | INTRAVENOUS | Status: AC | PRN
  Administered 2020-07-31: 75 mL via INTRAVENOUS

## 2020-08-02 DIAGNOSIS — M545 Low back pain: Secondary | ICD-10-CM | POA: Diagnosis not present

## 2020-08-02 DIAGNOSIS — M62830 Muscle spasm of back: Secondary | ICD-10-CM | POA: Diagnosis not present

## 2020-08-02 DIAGNOSIS — M9903 Segmental and somatic dysfunction of lumbar region: Secondary | ICD-10-CM | POA: Diagnosis not present

## 2020-08-02 DIAGNOSIS — M9901 Segmental and somatic dysfunction of cervical region: Secondary | ICD-10-CM | POA: Diagnosis not present

## 2020-08-07 DIAGNOSIS — M5416 Radiculopathy, lumbar region: Secondary | ICD-10-CM | POA: Diagnosis not present

## 2020-08-07 DIAGNOSIS — M791 Myalgia, unspecified site: Secondary | ICD-10-CM | POA: Diagnosis not present

## 2020-08-07 DIAGNOSIS — M461 Sacroiliitis, not elsewhere classified: Secondary | ICD-10-CM | POA: Diagnosis not present

## 2020-08-09 ENCOUNTER — Other Ambulatory Visit: Payer: Self-pay | Admitting: Orthopedic Surgery

## 2020-08-12 ENCOUNTER — Ambulatory Visit
Admission: RE | Admit: 2020-08-12 | Discharge: 2020-08-12 | Disposition: A | Discharge status: Home / Self Care | Payer: Federal, State, Local not specified - PPO | Source: Ambulatory Visit | Attending: Orthopedic Surgery | Admitting: Orthopedic Surgery

## 2020-08-12 DIAGNOSIS — M5127 Other intervertebral disc displacement, lumbosacral region: Secondary | ICD-10-CM | POA: Diagnosis not present

## 2020-08-12 DIAGNOSIS — M545 Low back pain, unspecified: Secondary | ICD-10-CM

## 2020-08-12 DIAGNOSIS — M48061 Spinal stenosis, lumbar region without neurogenic claudication: Secondary | ICD-10-CM | POA: Diagnosis not present

## 2020-08-12 DIAGNOSIS — M47816 Spondylosis without myelopathy or radiculopathy, lumbar region: Secondary | ICD-10-CM | POA: Diagnosis not present

## 2020-08-14 DIAGNOSIS — M461 Sacroiliitis, not elsewhere classified: Secondary | ICD-10-CM | POA: Diagnosis not present

## 2020-08-14 DIAGNOSIS — M5416 Radiculopathy, lumbar region: Secondary | ICD-10-CM | POA: Diagnosis not present

## 2020-08-14 DIAGNOSIS — M791 Myalgia, unspecified site: Secondary | ICD-10-CM | POA: Diagnosis not present

## 2020-08-15 DIAGNOSIS — S63502D Unspecified sprain of left wrist, subsequent encounter: Secondary | ICD-10-CM | POA: Diagnosis not present

## 2020-08-15 DIAGNOSIS — M1812 Unilateral primary osteoarthritis of first carpometacarpal joint, left hand: Secondary | ICD-10-CM | POA: Diagnosis not present

## 2020-08-15 DIAGNOSIS — M6530 Trigger finger, unspecified finger: Secondary | ICD-10-CM | POA: Diagnosis not present

## 2020-08-21 DIAGNOSIS — M5416 Radiculopathy, lumbar region: Secondary | ICD-10-CM | POA: Diagnosis not present

## 2020-08-21 DIAGNOSIS — M791 Myalgia, unspecified site: Secondary | ICD-10-CM | POA: Diagnosis not present

## 2020-08-21 DIAGNOSIS — M461 Sacroiliitis, not elsewhere classified: Secondary | ICD-10-CM | POA: Diagnosis not present

## 2020-08-24 DIAGNOSIS — M5416 Radiculopathy, lumbar region: Secondary | ICD-10-CM | POA: Diagnosis not present

## 2020-08-27 DIAGNOSIS — M9903 Segmental and somatic dysfunction of lumbar region: Secondary | ICD-10-CM | POA: Diagnosis not present

## 2020-08-27 DIAGNOSIS — M62830 Muscle spasm of back: Secondary | ICD-10-CM | POA: Diagnosis not present

## 2020-08-27 DIAGNOSIS — M545 Low back pain, unspecified: Secondary | ICD-10-CM | POA: Diagnosis not present

## 2020-08-27 DIAGNOSIS — M9901 Segmental and somatic dysfunction of cervical region: Secondary | ICD-10-CM | POA: Diagnosis not present

## 2020-08-28 DIAGNOSIS — J302 Other seasonal allergic rhinitis: Secondary | ICD-10-CM | POA: Diagnosis not present

## 2020-08-28 DIAGNOSIS — K118 Other diseases of salivary glands: Secondary | ICD-10-CM | POA: Diagnosis not present

## 2020-08-28 DIAGNOSIS — K117 Disturbances of salivary secretion: Secondary | ICD-10-CM | POA: Diagnosis not present

## 2020-09-04 ENCOUNTER — Other Ambulatory Visit: Payer: Self-pay | Admitting: Otolaryngology

## 2020-09-04 DIAGNOSIS — M461 Sacroiliitis, not elsewhere classified: Secondary | ICD-10-CM | POA: Diagnosis not present

## 2020-09-04 DIAGNOSIS — M791 Myalgia, unspecified site: Secondary | ICD-10-CM | POA: Diagnosis not present

## 2020-09-04 DIAGNOSIS — M5416 Radiculopathy, lumbar region: Secondary | ICD-10-CM | POA: Diagnosis not present

## 2020-09-11 DIAGNOSIS — M5416 Radiculopathy, lumbar region: Secondary | ICD-10-CM | POA: Diagnosis not present

## 2020-09-12 ENCOUNTER — Other Ambulatory Visit: Payer: Self-pay | Admitting: Physical Medicine and Rehabilitation

## 2020-09-12 DIAGNOSIS — M5416 Radiculopathy, lumbar region: Secondary | ICD-10-CM

## 2020-09-18 NOTE — Progress Notes (Signed)
[id number] - [place], [place] - [address] AT CORNER OF [facility] [address] [place] [address] Phone: [phone] Fax: [phone]      Your procedure is scheduled on Friday 09/21/2020.  Report to [facility] Main Entrance "A" at 06:50am A.M., and check in at the Admitting office.  Call this number if you have problems the morning of surgery:  [phone]  Call [phone] if you have any questions prior to your surgery date Monday-Friday 8am-4pm    Remember:  Do not eat or drink after midnight the night before your surgery     Take these medicines the morning of surgery with A SIP OF: Atorvastatin (Lipitor) Azelastine (Optivar) eye drop Baclofen (Lioresal) Budesonide-Formoterol (Symbicort) - Please bring all inhalers with you the day of surgery.  Duloxetine (Cymbalta) Fluticasone (Flonase) Gabapentin (Neurontin) Pantoprazole (Protonix)  If NEEDED, you may take the following medications the morning of surgery: Acetaminophen (Tylenol) Albuterol (Proventil) nebulizer Benzonatate (Tessalon) Fexofenadine (Allegra) Hydrocodone-acetaminophen (Norco) Lorazepam (Ativan) Methocarbamol (Robaxin) Ondansetron (Zofran) or Promethazine (Phenergan) Polyethyl Glycol-Propyl (Systane) eye drop    As of today, STOP taking any Diclofenac (Voltaren), Aspirin (unless otherwise instructed by your surgeon) and Aspirin-containing products, Aleve, Naproxen, Ibuprofen, Motrin, Advil, Goody's, BC's, all herbal medications, fish oil, and all vitamins - including Neuriva, Turmeric and Goli                      Do not wear jewelry, make up, or nail polish            Do not wear lotions, powders, perfumes, or deodorant.            Do not shave 48 hours prior to surgery.  Men may shave face and neck.            Do not bring valuables to the hospital.            [facility] is not responsible for any belongings or valuables.  Do NOT Smoke  (Tobacco/Vaping) or drink Alcohol 24 hours prior to your procedure  If you use a CPAP at night, you may bring all equipment for your overnight stay.   Contacts, glasses, dentures or bridgework may not be worn into surgery.      For patients admitted to the hospital, discharge time will be determined by your treatment team.   Patients discharged the day of surgery will not be allowed to drive home, and someone needs to stay with them for 24 hours.    Special instructions:   [facility]- Preparing For Surgery  Before surgery, you can play an important role. Because skin is not sterile, your skin needs to be as free of germs as possible. You can reduce the number of germs on your skin by washing with CHG (chlorahexidine gluconate) Soap before surgery.  CHG is an antiseptic cleaner which kills germs and bonds with the skin to continue killing germs even after washing.    Oral Hygiene is also important to reduce your risk of infection.  Remember - BRUSH YOUR TEETH THE MORNING OF SURGERY WITH YOUR REGULAR TOOTHPASTE  Please do not use if you have an allergy to CHG or antibacterial soaps. If your skin becomes reddened/irritated stop using the CHG.  Do not shave (including legs and underarms) for at least 48 hours prior to first CHG shower. It is OK to shave your face.  Please follow these instructions carefully.   1. Shower the NIGHT BEFORE SURGERY and the  MORNING OF SURGERY with CHG Soap.   2. If you chose to wash your hair, wash your hair first as usual with your normal shampoo.  3. After you shampoo, rinse your hair and body thoroughly to remove the shampoo.  4. Use CHG as you would any other liquid soap. You can apply CHG directly to the skin and wash gently with a scrungie or a clean washcloth.   5. Apply the CHG Soap to your body ONLY FROM THE NECK DOWN.  Do not use on open wounds or open sores. Avoid contact with your eyes, ears, mouth and genitals (private parts). Wash Face and  genitals (private parts)  with your normal soap.   6. Wash thoroughly, paying special attention to the area where your surgery will be performed.  7. Thoroughly rinse your body with warm water from the neck down.  8. DO NOT shower/wash with your normal soap after using and rinsing off the CHG Soap.  9. Pat yourself dry with a CLEAN TOWEL.  10. Wear CLEAN PAJAMAS to bed the night before surgery  11. Place CLEAN SHEETS on your bed the night of your first shower and DO NOT SLEEP WITH PETS.   Day of Surgery: Shower with CHG soap as directed Wear Clean/Comfortable clothing the morning of surgery Do not apply any deodorants/lotions.   Remember to brush your teeth WITH YOUR REGULAR TOOTHPASTE.   Please read over the following fact sheets that you were given.

## 2020-09-19 ENCOUNTER — Encounter (HOSPITAL_COMMUNITY)
Admission: RE | Admit: 2020-09-19 | Discharge: 2020-09-19 | Disposition: A | Discharge status: Home / Self Care | Payer: Federal, State, Local not specified - PPO | Source: Ambulatory Visit | Attending: Otolaryngology | Admitting: Otolaryngology

## 2020-09-19 ENCOUNTER — Other Ambulatory Visit (HOSPITAL_COMMUNITY)
Admission: RE | Admit: 2020-09-19 | Discharge: 2020-09-19 | Disposition: A | Discharge status: Home / Self Care | Payer: Federal, State, Local not specified - PPO | Source: Ambulatory Visit | Attending: Otolaryngology | Admitting: Otolaryngology

## 2020-09-19 ENCOUNTER — Other Ambulatory Visit: Payer: Self-pay

## 2020-09-19 ENCOUNTER — Encounter (HOSPITAL_COMMUNITY): Payer: Self-pay

## 2020-09-19 DIAGNOSIS — Z20822 Contact with and (suspected) exposure to covid-19: Secondary | ICD-10-CM | POA: Insufficient documentation

## 2020-09-19 DIAGNOSIS — Z01812 Encounter for preprocedural laboratory examination: Secondary | ICD-10-CM | POA: Diagnosis not present

## 2020-09-19 DIAGNOSIS — M791 Myalgia, unspecified site: Secondary | ICD-10-CM | POA: Diagnosis not present

## 2020-09-19 DIAGNOSIS — M461 Sacroiliitis, not elsewhere classified: Secondary | ICD-10-CM | POA: Diagnosis not present

## 2020-09-19 DIAGNOSIS — M5416 Radiculopathy, lumbar region: Secondary | ICD-10-CM | POA: Diagnosis not present

## 2020-09-19 DIAGNOSIS — Z01818 Encounter for other preprocedural examination: Secondary | ICD-10-CM | POA: Insufficient documentation

## 2020-09-19 HISTORY — DX: Anemia, unspecified: D64.9

## 2020-09-19 HISTORY — DX: Personal history of other diseases of the digestive system: Z87.19

## 2020-09-19 HISTORY — DX: Unspecified asthma, uncomplicated: J45.909

## 2020-09-19 LAB — BASIC METABOLIC PANEL
Anion gap: 9 (ref 5–15)
BUN: 8 mg/dL (ref 8–23)
CO2: 29 mmol/L (ref 22–32)
Calcium: 9.5 mg/dL (ref 8.9–10.3)
Chloride: 102 mmol/L (ref 98–111)
Creatinine, Ser: 0.92 mg/dL (ref 0.44–1.00)
GFR, Estimated: >60 mL/min (ref >60)
Glucose, Bld: 122 mg/dL — ABNORMAL HIGH (ref 70–99)
Potassium: 3.6 mmol/L (ref 3.5–5.1)
Sodium: 140 mmol/L (ref 135–145)

## 2020-09-19 LAB — CBC
HCT: 39.5 % (ref 36.0–46.0)
Hemoglobin: 12.1 g/dL (ref 12.0–15.0)
MCH: 27.5 pg (ref 26.0–34.0)
MCHC: 30.6 g/dL (ref 30.0–36.0)
MCV: 89.8 fL (ref 80.0–100.0)
Platelets: 265 10*3/uL (ref 150–400)
RBC: 4.4 MIL/uL (ref 3.87–5.11)
RDW: 14.5 % (ref 11.5–15.5)
WBC: 4.7 10*3/uL (ref 4.0–10.5)
nRBC: 0 % (ref 0.0–0.2)

## 2020-09-19 LAB — SARS CORONAVIRUS 2 (TAT 6-24 HRS): SARS Coronavirus 2: NEGATIVE

## 2020-09-19 NOTE — Progress Notes (Signed)
PCP - Dr. Henrine Screws Cardiologist - Denies  Chest x-ray - 05/25/20 EKG - 09/19/20 Stress Test - 01/03/13 ECHO - 12/15/12 Cardiac Cath - Denies  Sleep Study - Yes Has OSA CPAP - Nightly  DM - Denies, pre diabetic  COVID TEST- 09/19/20   Anesthesia review: No  Patient denies shortness of breath, fever, cough and chest pain at PAT appointment   All instructions explained to the patient, with a verbal understanding of the material. Patient agrees to go over the instructions while at home for a better understanding. Patient also instructed to self quarantine after being tested for COVID-19. The opportunity to ask questions was provided.

## 2020-09-20 ENCOUNTER — Encounter (HOSPITAL_COMMUNITY): Payer: Self-pay | Admitting: Otolaryngology

## 2020-09-21 ENCOUNTER — Other Ambulatory Visit: Payer: Self-pay

## 2020-09-21 ENCOUNTER — Ambulatory Visit (HOSPITAL_COMMUNITY): Payer: Federal, State, Local not specified - PPO | Admitting: Anesthesiology

## 2020-09-21 ENCOUNTER — Encounter (HOSPITAL_COMMUNITY): Payer: Self-pay | Admitting: Otolaryngology

## 2020-09-21 ENCOUNTER — Encounter (HOSPITAL_COMMUNITY)
Admission: RE | Disposition: A | Discharge status: Home / Self Care | Payer: Self-pay | Source: Ambulatory Visit | Attending: Otolaryngology

## 2020-09-21 ENCOUNTER — Observation Stay (HOSPITAL_COMMUNITY)
Admission: RE | Admit: 2020-09-21 | Discharge: 2020-09-22 | Disposition: A | Discharge status: Home / Self Care | Payer: Federal, State, Local not specified - PPO | Source: Ambulatory Visit | Attending: Otolaryngology | Admitting: Otolaryngology

## 2020-09-21 DIAGNOSIS — Z91040 Latex allergy status: Secondary | ICD-10-CM | POA: Insufficient documentation

## 2020-09-21 DIAGNOSIS — K118 Other diseases of salivary glands: Principal | ICD-10-CM | POA: Diagnosis present

## 2020-09-21 DIAGNOSIS — J45909 Unspecified asthma, uncomplicated: Secondary | ICD-10-CM | POA: Insufficient documentation

## 2020-09-21 DIAGNOSIS — R7303 Prediabetes: Secondary | ICD-10-CM | POA: Diagnosis not present

## 2020-09-21 DIAGNOSIS — D649 Anemia, unspecified: Secondary | ICD-10-CM | POA: Diagnosis not present

## 2020-09-21 DIAGNOSIS — Z79899 Other long term (current) drug therapy: Secondary | ICD-10-CM | POA: Insufficient documentation

## 2020-09-21 DIAGNOSIS — Z87891 Personal history of nicotine dependence: Secondary | ICD-10-CM | POA: Diagnosis not present

## 2020-09-21 DIAGNOSIS — G4730 Sleep apnea, unspecified: Secondary | ICD-10-CM | POA: Diagnosis not present

## 2020-09-21 DIAGNOSIS — I10 Essential (primary) hypertension: Secondary | ICD-10-CM | POA: Diagnosis not present

## 2020-09-21 HISTORY — PX: PAROTIDECTOMY: SHX2163

## 2020-09-21 LAB — GLUCOSE, CAPILLARY: Glucose-Capillary: 117 mg/dL — ABNORMAL HIGH (ref 70–99)

## 2020-09-21 SURGERY — EXCISION, PAROTID GLAND
Anesthesia: General | Site: Neck | Laterality: Left

## 2020-09-21 MED ORDER — LIDOCAINE-EPINEPHRINE 1 %-1:100000 IJ SOLN
INTRAMUSCULAR | Status: DC | PRN
  Administered 2020-09-21: 3 mL

## 2020-09-21 MED ORDER — KETAMINE HCL 10 MG/ML IJ SOLN
INTRAMUSCULAR | Status: DC | PRN
  Administered 2020-09-21: 30 mg via INTRAVENOUS

## 2020-09-21 MED ORDER — BENZONATATE 100 MG PO CAPS: 200.0000 mg | ORAL_CAPSULE | Freq: Three times a day (TID) | ORAL | Status: DC | PRN

## 2020-09-21 MED ORDER — OXYCODONE-ACETAMINOPHEN 5-325 MG PO TABS: 1.0000 | ORAL_TABLET | Freq: Every evening | ORAL | Status: DC | PRN

## 2020-09-21 MED ORDER — GLYCOPYRROLATE PF 0.2 MG/ML IJ SOSY
PREFILLED_SYRINGE | INTRAMUSCULAR | Status: AC
  Filled 2020-09-21: qty 1

## 2020-09-21 MED ORDER — HYDROCODONE-ACETAMINOPHEN 5-325 MG PO TABS: ORAL_TABLET | ORAL | 0 refills | Status: DC

## 2020-09-21 MED ORDER — HYDROCHLOROTHIAZIDE 25 MG PO TABS
12.5000 mg | ORAL_TABLET | Freq: Two times a day (BID) | ORAL | Status: DC
  Administered 2020-09-22: 12.5 mg via ORAL
  Filled 2020-09-21: qty 1

## 2020-09-21 MED ORDER — NAPROXEN 250 MG PO TABS
500.0000 mg | ORAL_TABLET | Freq: Two times a day (BID) | ORAL | Status: DC
  Administered 2020-09-22: 500 mg via ORAL
  Filled 2020-09-21 (×2): qty 2

## 2020-09-21 MED ORDER — PHENYLEPHRINE 40 MCG/ML (10ML) SYRINGE FOR IV PUSH (FOR BLOOD PRESSURE SUPPORT)
PREFILLED_SYRINGE | INTRAVENOUS | Status: AC
  Filled 2020-09-21: qty 10

## 2020-09-21 MED ORDER — ONDANSETRON HCL 4 MG/2ML IJ SOLN: 4.0000 mg | Freq: Once | INTRAMUSCULAR | Status: DC | PRN

## 2020-09-21 MED ORDER — ACETAMINOPHEN ER 650 MG PO TBCR: 1300.0000 mg | EXTENDED_RELEASE_TABLET | Freq: Two times a day (BID) | ORAL | Status: DC | PRN

## 2020-09-21 MED ORDER — MOMETASONE FURO-FORMOTEROL FUM 100-5 MCG/ACT IN AERO
2.0000 | INHALATION_SPRAY | Freq: Two times a day (BID) | RESPIRATORY_TRACT | Status: DC
  Filled 2020-09-21 (×2): qty 8.8

## 2020-09-21 MED ORDER — GABAPENTIN 400 MG PO CAPS
800.0000 mg | ORAL_CAPSULE | Freq: Three times a day (TID) | ORAL | Status: DC
  Administered 2020-09-21 – 2020-09-22 (×3): 800 mg via ORAL
  Filled 2020-09-21 (×3): qty 2

## 2020-09-21 MED ORDER — PANTOPRAZOLE SODIUM 40 MG PO TBEC
40.0000 mg | DELAYED_RELEASE_TABLET | Freq: Two times a day (BID) | ORAL | Status: DC
  Administered 2020-09-21 – 2020-09-22 (×2): 40 mg via ORAL
  Filled 2020-09-21 (×2): qty 1

## 2020-09-21 MED ORDER — PHENYLEPHRINE 40 MCG/ML (10ML) SYRINGE FOR IV PUSH (FOR BLOOD PRESSURE SUPPORT)
PREFILLED_SYRINGE | INTRAVENOUS | Status: DC | PRN
  Administered 2020-09-21: 120 ug via INTRAVENOUS
  Administered 2020-09-21 (×2): 80 ug via INTRAVENOUS

## 2020-09-21 MED ORDER — ORAL CARE MOUTH RINSE: 15.0000 mL | Freq: Once | OROMUCOSAL | Status: AC

## 2020-09-21 MED ORDER — LOSARTAN POTASSIUM 25 MG PO TABS
25.0000 mg | ORAL_TABLET | Freq: Two times a day (BID) | ORAL | Status: DC
  Administered 2020-09-22: 25 mg via ORAL
  Filled 2020-09-21: qty 1

## 2020-09-21 MED ORDER — ONDANSETRON HCL 4 MG PO TABS: 4.0000 mg | ORAL_TABLET | ORAL | Status: DC | PRN

## 2020-09-21 MED ORDER — ACETAMINOPHEN 10 MG/ML IV SOLN: 1000.0000 mg | Freq: Once | INTRAVENOUS | Status: DC | PRN

## 2020-09-21 MED ORDER — PROPOFOL 10 MG/ML IV BOLUS
INTRAVENOUS | Status: DC | PRN
  Administered 2020-09-21: 150 mg via INTRAVENOUS

## 2020-09-21 MED ORDER — DULOXETINE HCL 60 MG PO CPEP
60.0000 mg | ORAL_CAPSULE | Freq: Every day | ORAL | Status: DC
  Administered 2020-09-22: 60 mg via ORAL
  Filled 2020-09-21: qty 1

## 2020-09-21 MED ORDER — LORAZEPAM 0.5 MG PO TABS: 0.5000 mg | ORAL_TABLET | Freq: Two times a day (BID) | ORAL | Status: DC | PRN

## 2020-09-21 MED ORDER — KETAMINE HCL 50 MG/5ML IJ SOSY
PREFILLED_SYRINGE | INTRAMUSCULAR | Status: AC
  Filled 2020-09-21: qty 5

## 2020-09-21 MED ORDER — DEXAMETHASONE SODIUM PHOSPHATE 10 MG/ML IJ SOLN
INTRAMUSCULAR | Status: DC | PRN
  Administered 2020-09-21: 10 mg via INTRAVENOUS

## 2020-09-21 MED ORDER — IBUPROFEN 100 MG/5ML PO SUSP: 400.0000 mg | Freq: Four times a day (QID) | ORAL | Status: DC | PRN

## 2020-09-21 MED ORDER — PROMETHAZINE HCL 25 MG PO TABS: 12.5000 mg | ORAL_TABLET | Freq: Four times a day (QID) | ORAL | Status: DC | PRN

## 2020-09-21 MED ORDER — MIDAZOLAM HCL 2 MG/2ML IJ SOLN
INTRAMUSCULAR | Status: DC | PRN
  Administered 2020-09-21: 2 mg via INTRAVENOUS

## 2020-09-21 MED ORDER — PHENYLEPHRINE HCL-NACL 10-0.9 MG/250ML-% IV SOLN
INTRAVENOUS | Status: DC | PRN
  Administered 2020-09-21: 25 ug/min via INTRAVENOUS

## 2020-09-21 MED ORDER — FENTANYL CITRATE (PF) 100 MCG/2ML IJ SOLN: 25.0000 ug | INTRAMUSCULAR | Status: DC | PRN

## 2020-09-21 MED ORDER — 0.9 % SODIUM CHLORIDE (POUR BTL) OPTIME
TOPICAL | Status: DC | PRN
  Administered 2020-09-21: 1000 mL

## 2020-09-21 MED ORDER — DEXAMETHASONE SODIUM PHOSPHATE 10 MG/ML IJ SOLN
INTRAMUSCULAR | Status: AC
  Filled 2020-09-21: qty 1

## 2020-09-21 MED ORDER — ADULT MULTIVITAMIN W/MINERALS CH
1.0000 | ORAL_TABLET | Freq: Every day | ORAL | Status: DC
  Administered 2020-09-21: 1 via ORAL
  Filled 2020-09-21: qty 1

## 2020-09-21 MED ORDER — LIDOCAINE 2% (20 MG/ML) 5 ML SYRINGE
INTRAMUSCULAR | Status: DC | PRN
  Administered 2020-09-21: 60 mg via INTRAVENOUS

## 2020-09-21 MED ORDER — FENTANYL CITRATE (PF) 250 MCG/5ML IJ SOLN
INTRAMUSCULAR | Status: AC
  Filled 2020-09-21: qty 5

## 2020-09-21 MED ORDER — FENTANYL CITRATE (PF) 250 MCG/5ML IJ SOLN
INTRAMUSCULAR | Status: DC | PRN
  Administered 2020-09-21: 50 ug via INTRAVENOUS
  Administered 2020-09-21: 25 ug via INTRAVENOUS
  Administered 2020-09-21: 100 ug via INTRAVENOUS

## 2020-09-21 MED ORDER — CEFAZOLIN SODIUM-DEXTROSE 2-4 GM/100ML-% IV SOLN
2.0000 g | INTRAVENOUS | Status: AC
  Administered 2020-09-21: 2 g via INTRAVENOUS
  Filled 2020-09-21: qty 100

## 2020-09-21 MED ORDER — LIDOCAINE-EPINEPHRINE 1 %-1:100000 IJ SOLN
INTRAMUSCULAR | Status: AC
  Filled 2020-09-21: qty 1

## 2020-09-21 MED ORDER — LACTATED RINGERS IV SOLN: INTRAVENOUS | Status: DC

## 2020-09-21 MED ORDER — HYDROCODONE-ACETAMINOPHEN 5-325 MG PO TABS
1.0000 | ORAL_TABLET | ORAL | Status: DC | PRN
  Administered 2020-09-21 – 2020-09-22 (×3): 2 via ORAL
  Filled 2020-09-21 (×3): qty 2

## 2020-09-21 MED ORDER — ONDANSETRON HCL 4 MG/2ML IJ SOLN
INTRAMUSCULAR | Status: AC
  Filled 2020-09-21: qty 2

## 2020-09-21 MED ORDER — ONDANSETRON HCL 4 MG/2ML IJ SOLN
INTRAMUSCULAR | Status: DC | PRN
  Administered 2020-09-21: 4 mg via INTRAVENOUS

## 2020-09-21 MED ORDER — CHLORHEXIDINE GLUCONATE 0.12 % MT SOLN
15.0000 mL | Freq: Once | OROMUCOSAL | Status: AC
  Administered 2020-09-21: 15 mL via OROMUCOSAL
  Filled 2020-09-21: qty 15

## 2020-09-21 MED ORDER — BACLOFEN 10 MG PO TABS
20.0000 mg | ORAL_TABLET | Freq: Three times a day (TID) | ORAL | Status: DC
  Administered 2020-09-21 – 2020-09-22 (×3): 20 mg via ORAL
  Filled 2020-09-21 (×3): qty 2

## 2020-09-21 MED ORDER — POLYVINYL ALCOHOL 1.4 % OP SOLN
1.0000 [drp] | Freq: Four times a day (QID) | OPHTHALMIC | Status: DC | PRN
  Filled 2020-09-21: qty 15

## 2020-09-21 MED ORDER — DIPHENHYDRAMINE HCL 25 MG PO CAPS: 25.0000 mg | ORAL_CAPSULE | Freq: Every evening | ORAL | Status: DC | PRN

## 2020-09-21 MED ORDER — GLYCOPYRROLATE PF 0.2 MG/ML IJ SOSY
PREFILLED_SYRINGE | INTRAMUSCULAR | Status: DC | PRN
  Administered 2020-09-21 (×2): .1 mg via INTRAVENOUS

## 2020-09-21 MED ORDER — ALBUTEROL SULFATE HFA 108 (90 BASE) MCG/ACT IN AERS
1.0000 | INHALATION_SPRAY | Freq: Four times a day (QID) | RESPIRATORY_TRACT | Status: DC | PRN
  Filled 2020-09-21: qty 6.7

## 2020-09-21 MED ORDER — TRAZODONE HCL 50 MG PO TABS: 50.0000 mg | ORAL_TABLET | Freq: Every evening | ORAL | Status: DC | PRN

## 2020-09-21 MED ORDER — ATORVASTATIN CALCIUM 10 MG PO TABS
20.0000 mg | ORAL_TABLET | Freq: Every day | ORAL | Status: DC
  Administered 2020-09-22: 20 mg via ORAL
  Filled 2020-09-21: qty 2

## 2020-09-21 MED ORDER — NORTRIPTYLINE HCL 25 MG PO CAPS
25.0000 mg | ORAL_CAPSULE | Freq: Every day | ORAL | Status: DC
  Administered 2020-09-21: 25 mg via ORAL
  Filled 2020-09-21 (×3): qty 1

## 2020-09-21 MED ORDER — FLUTICASONE PROPIONATE 50 MCG/ACT NA SUSP
2.0000 | Freq: Two times a day (BID) | NASAL | Status: DC
  Administered 2020-09-21 – 2020-09-22 (×2): 2 via NASAL
  Filled 2020-09-21 (×2): qty 16

## 2020-09-21 MED ORDER — MIDAZOLAM HCL 2 MG/2ML IJ SOLN
INTRAMUSCULAR | Status: AC
  Filled 2020-09-21: qty 2

## 2020-09-21 MED ORDER — ONDANSETRON HCL 4 MG/2ML IJ SOLN: 4.0000 mg | INTRAMUSCULAR | Status: DC | PRN

## 2020-09-21 MED ORDER — METHOCARBAMOL 500 MG PO TABS
500.0000 mg | ORAL_TABLET | Freq: Four times a day (QID) | ORAL | Status: DC | PRN
  Administered 2020-09-21: 500 mg via ORAL
  Filled 2020-09-21: qty 1

## 2020-09-21 MED ORDER — PROPOFOL 10 MG/ML IV BOLUS
INTRAVENOUS | Status: AC
  Filled 2020-09-21: qty 20

## 2020-09-21 MED ORDER — MONTELUKAST SODIUM 10 MG PO TABS
10.0000 mg | ORAL_TABLET | Freq: Every day | ORAL | Status: DC
  Administered 2020-09-21: 10 mg via ORAL
  Filled 2020-09-21: qty 1

## 2020-09-21 MED ORDER — DEXTROSE IN LACTATED RINGERS 5 % IV SOLN: INTRAVENOUS | Status: DC

## 2020-09-21 MED ORDER — ONDANSETRON HCL 4 MG PO TABS: 4.0000 mg | ORAL_TABLET | Freq: Three times a day (TID) | ORAL | Status: DC | PRN

## 2020-09-21 MED ORDER — DULOXETINE HCL 30 MG PO CPEP
30.0000 mg | ORAL_CAPSULE | Freq: Every day | ORAL | Status: DC
  Administered 2020-09-22: 30 mg via ORAL
  Filled 2020-09-21: qty 1

## 2020-09-21 MED ORDER — HYDROCODONE-ACETAMINOPHEN 10-325 MG PO TABS: 1.0000 | ORAL_TABLET | ORAL | Status: DC | PRN

## 2020-09-21 MED ORDER — SUCCINYLCHOLINE CHLORIDE 200 MG/10ML IV SOSY
PREFILLED_SYRINGE | INTRAVENOUS | Status: DC | PRN
  Administered 2020-09-21: 120 mg via INTRAVENOUS

## 2020-09-21 SURGICAL SUPPLY — 54 items
ADH SKN CLS APL DERMABOND .7 (GAUZE/BANDAGES/DRESSINGS) ×1
ATTRACTOMAT 16X20 MAGNETIC DRP (DRAPES) IMPLANT
BLADE CLIPPER SURG (BLADE) IMPLANT
BLADE SURG 15 STRL LF DISP TIS (BLADE) IMPLANT
BLADE SURG 15 STRL SS (BLADE)
CABLE BIPOLOR RESECTION CORD (MISCELLANEOUS) ×2 IMPLANT
CANISTER SUCT 3000ML PPV (MISCELLANEOUS) ×2 IMPLANT
CLEANER TIP ELECTROSURG 2X2 (MISCELLANEOUS) ×2 IMPLANT
CNTNR URN SCR LID CUP LEK RST (MISCELLANEOUS) ×1 IMPLANT
CONT SPEC 4OZ STRL OR WHT (MISCELLANEOUS) ×2
COVER SURGICAL LIGHT HANDLE (MISCELLANEOUS) ×2 IMPLANT
COVER WAND RF STERILE (DRAPES) ×1 IMPLANT
DERMABOND ADVANCED (GAUZE/BANDAGES/DRESSINGS) ×1
DERMABOND ADVANCED .7 DNX12 (GAUZE/BANDAGES/DRESSINGS) ×1 IMPLANT
DRAIN JACKSON RD 7FR 3/32 (WOUND CARE) ×1 IMPLANT
DRAIN PENROSE 1/4X12 LTX STRL (WOUND CARE) IMPLANT
DRAIN SNY 10 ROU (WOUND CARE) IMPLANT
DRAPE HALF SHEET 40X57 (DRAPES) IMPLANT
DRAPE SURG 17X23 STRL (DRAPES) ×2 IMPLANT
DRSG TEGADERM 2-3/8X2-3/4 SM (GAUZE/BANDAGES/DRESSINGS) ×10 IMPLANT
ELECT COATED BLADE 2.86 ST (ELECTRODE) ×2 IMPLANT
ELECT PAIRED SUBDERMAL (MISCELLANEOUS) ×2
ELECT REM PT RETURN 9FT ADLT (ELECTROSURGICAL) ×2
ELECTRODE PAIRED SUBDERMAL (MISCELLANEOUS) ×1 IMPLANT
ELECTRODE REM PT RTRN 9FT ADLT (ELECTROSURGICAL) ×1 IMPLANT
EVACUATOR SILICONE 100CC (DRAIN) ×1 IMPLANT
FORCEPS BIPOLAR SPETZLER 8 1.0 (NEUROSURGERY SUPPLIES) ×2 IMPLANT
GAUZE 4X4 16PLY RFD (DISPOSABLE) ×2 IMPLANT
GLOVE BIOGEL M 7.0 STRL (GLOVE) ×1 IMPLANT
GOWN STRL REUS W/ TWL LRG LVL3 (GOWN DISPOSABLE) ×2 IMPLANT
GOWN STRL REUS W/TWL LRG LVL3 (GOWN DISPOSABLE) ×6
KIT BASIN OR (CUSTOM PROCEDURE TRAY) ×2 IMPLANT
KIT TURNOVER KIT B (KITS) ×2 IMPLANT
NDL HYPO 25GX1X1/2 BEV (NEEDLE) ×1 IMPLANT
NEEDLE HYPO 25GX1X1/2 BEV (NEEDLE) ×2 IMPLANT
NS IRRIG 1000ML POUR BTL (IV SOLUTION) ×2 IMPLANT
PAD ARMBOARD 7.5X6 YLW CONV (MISCELLANEOUS) ×4 IMPLANT
PENCIL SMOKE EVACUATOR (MISCELLANEOUS) ×2 IMPLANT
PROBE NERVBE PRASS .33 (MISCELLANEOUS) ×2 IMPLANT
SHEARS HARMONIC 9CM CVD (BLADE) ×2 IMPLANT
SPONGE INTESTINAL PEANUT (DISPOSABLE) ×1 IMPLANT
STAPLER VISISTAT 35W (STAPLE) ×2 IMPLANT
SUT ETHILON 3 0 PS 1 (SUTURE) ×1 IMPLANT
SUT ETHILON 5 0 P 3 18 (SUTURE)
SUT ETHILON 6 0 P 1 (SUTURE) IMPLANT
SUT NYLON ETHILON 5-0 P-3 1X18 (SUTURE) IMPLANT
SUT SILK 2 0 PERMA HAND 18 BK (SUTURE) IMPLANT
SUT SILK 2 0 REEL (SUTURE) ×2 IMPLANT
SUT SILK 2 0 SH CR/8 (SUTURE) ×2 IMPLANT
SUT SILK 3 0 REEL (SUTURE) ×1 IMPLANT
SUT VIC AB 5-0 P-3 18XBRD (SUTURE) ×1 IMPLANT
SUT VIC AB 5-0 P3 18 (SUTURE) ×2
SUT VICRYL 4-0 PS2 18IN ABS (SUTURE) ×2 IMPLANT
TRAY ENT MC OR (CUSTOM PROCEDURE TRAY) ×2 IMPLANT

## 2020-09-21 NOTE — H&P (Signed)
Ashley [name] is an 64 y.o. female.   Chief Complaint: Left parotid mass HPI: Patient with history of gradually enlarging left parotid mass.  CT scan shows intraparotid mass consistent with pleomorphic adenoma.  No lymphadenopathy or neck mass.  Past Medical History:  Diagnosis Date  . Anemia   . Anxiety 2020  . Arthritis   . Asthma 2013-2014  . Depression   . Dyspnea 2013  . Fibromyalgia 09/04/2014  . GERD (gastroesophageal reflux disease)   . Headache   . History of hiatal hernia   . Hypertension   . Lumbar radicular pain 09/04/2014  . Pre-diabetes   . Sacroiliac joint pain    nonunion of left SI joint  . Sleep apnea 2016  . Spinal headache    trouble waking   . TIA (transient ischemic attack)   . Wears glasses     Past Surgical History:  Procedure Laterality Date  . ABDOMINAL HYSTERECTOMY    . BACK SURGERY  2002  . BREAST LUMPECTOMY Left 1985  . BREAST SURGERY    . BUNIONECTOMY    . COLONOSCOPY    . DILATION AND CURETTAGE OF UTERUS    . ESOPHAGEAL MANOMETRY [name] 11/21/2013   Procedure: ESOPHAGEAL MANOMETRY (EM);  Surgeon: Shirley Friar, MD;  Location: [facility];  Service: Endoscopy;  Laterality: [name];  . KNEE ARTHROSCOPY W/ MENISCAL REPAIR    . LUMBAR LAMINECTOMY    . MULTIPLE TOOTH EXTRACTIONS    . NASAL SINUS SURGERY    . SACROILIAC JOINT FUSION Left 08/05/2018   Procedure: REVISION LEFT SACROILIAC JOINT FUSION WITH INSTRUMENTATION AND ALLOGRAFT;  Surgeon: Estill Bamberg, MD;  Location: [facility] OR;  Service: Orthopedics;  Laterality: Left;  . TONSILLECTOMY      Family History  Problem Relation Age of Onset  . Aneurysm Father   . Bipolar disorder Brother   . Diabetes Brother   . Migraines Mother   . Eczema Sister   . Urticaria Sister   . Migraines Daughter   . Urticaria Daughter   . Asthma Daughter   . Cancer Other   . Hypertension Other   . Diabetes Other   . Allergic rhinitis Neg Hx   . Angioedema Neg Hx   . Immunodeficiency Neg Hx     Social History:  reports that she quit smoking about 27 years ago. Her smoking use included cigarettes. She has a 18.00 pack-year smoking history. She has never used smokeless tobacco. She reports current alcohol use of about 1.0 standard drink of alcohol per week. She reports that she does not use drugs.  Allergies:  Allergies  Allergen Reactions  . Latex Itching, Swelling and Other (See Comments)    redness  . Sulfa Antibiotics Diarrhea and Nausea And Vomiting  . Aspirin Other (See Comments)    Can cause her to bleed out due to G6 PD  . Oxycodone Itching    "all over"  . Tramadol Other (See Comments)    Makes her feel "spaced out on street drugs"    Medications Prior to Admission  Medication Sig Dispense Refill  . acetaminophen (TYLENOL) 650 MG CR tablet Take 1,300 mg by mouth 2 (two) times daily as needed for pain.    Marland Kitchen atorvastatin (LIPITOR) 20 MG tablet Take 20 mg by mouth daily.     Marland Kitchen azelastine (OPTIVAR) 0.05 % ophthalmic solution Place 1 drop into the left eye 2 (two) times daily.    . baclofen (LIORESAL) 20 MG tablet Take 1 tablet (  20 mg total) by mouth 3 (three) times daily. 15 each 0  . budesonide-formoterol (SYMBICORT) 80-4.5 MCG/ACT inhaler Inhale 2 puffs into the lungs 2 (two) times daily. 1 each 5  . diclofenac Sodium (VOLTAREN) 1 % GEL Apply 2 g topically 4 (four) times daily as needed (apply to affected area as needed for pain). 100 g 0  . diphenhydrAMINE (BENADRYL) 25 MG tablet Take 25 mg by mouth at bedtime as needed. Take with oxycodone/acetaminophen to prevent itching.    . DULoxetine (CYMBALTA) 30 MG capsule Take 30 mg by mouth daily.    . DULoxetine (CYMBALTA) 60 MG capsule Take 60 mg by mouth daily.     . fexofenadine (ALLEGRA) 180 MG tablet Take 180 mg by mouth daily as needed for allergies.     . fluticasone (FLONASE) 50 MCG/ACT nasal spray Place 2 sprays into both nostrils 2 (two) times daily. 48 g 3  . gabapentin (NEURONTIN) 800 MG tablet Take 1 tablet  (800 mg total) by mouth 3 (three) times daily. 90 tablet 5  . hydrochlorothiazide (HYDRODIURIL) 12.5 MG tablet Take 12.5 mg by mouth 2 (two) times daily with breakfast and lunch.   1  . HYDROcodone-acetaminophen (NORCO) 10-325 MG tablet Take 1 tablet by mouth every 8 (eight) hours as needed. (Patient taking differently: Take 1 tablet by mouth every 4 (four) hours as needed (pain). ) 30 tablet 0  . losartan (COZAAR) 25 MG tablet Take 25 mg by mouth 2 (two) times daily with breakfast and lunch.   1  . MAGNESIUM CITRATE PO Take 1 capsule by mouth daily.    . methocarbamol (ROBAXIN) 500 MG tablet Take 500 mg by mouth every 6 (six) hours as needed for muscle spasms.     . Misc Natural Products (NEURIVA PO) Take 1 capsule by mouth daily.    . montelukast (SINGULAIR) 10 MG tablet Take 1 tablet (10 mg total) by mouth at bedtime. 90 tablet 0  . Multiple Vitamin (MULTIVITAMIN WITH MINERALS) TABS tablet Take 1 tablet by mouth daily at 3 pm. Centrum for Women    . Multiple Vitamins-Minerals (HAIR SKIN AND NAILS FORMULA PO) Take 3 capsules by mouth daily.    . naproxen (EC NAPROSYN) 500 MG EC tablet Take 500 mg by mouth 2 (two) times daily with a meal.     . nortriptyline (PAMELOR) 25 MG capsule Take 1 capsule (25 mg total) by mouth at bedtime. 30 capsule 0  . OVER THE COUNTER MEDICATION Take 1 capsule by mouth daily. [place] - apple cider vinegar, folic acid, vit b12    . pantoprazole (PROTONIX) 40 MG tablet Take 40 mg by mouth 2 (two) times daily.     Bertram Gala Glycol-Propyl Glycol (SYSTANE) 0.4-0.3 % SOLN Place 1 drop into both eyes every 6 (six) hours as needed (dry eyes).    . SUPER B COMPLEX/C PO Take 1 capsule by mouth daily at 3 pm.    . TURMERIC PO Take 1 capsule by mouth daily at 3 pm.    . albuterol (PROVENTIL HFA;VENTOLIN HFA) 108 (90 Base) MCG/ACT inhaler Inhale 1-2 puffs into the lungs every 6 (six) hours as needed for wheezing or shortness of breath. 16 g 6  . albuterol (PROVENTIL) (2.5 MG/3ML)  0.083% nebulizer solution USE 1 VIAL VIA NEBULIZER EVERY 4 HOURS AS NEEDED FOR WHEEZING OR SHORTNESS OF BREATH 150 mL 2  . benzonatate (TESSALON) 200 MG capsule Take 200 mg by mouth 3 (three) times daily as needed for cough.     Marland Kitchen  LORazepam (ATIVAN) 1 MG tablet Take 0.5-1 mg by mouth 2 (two) times daily as needed (stress/elevated blood pressure).    . ondansetron (ZOFRAN) 4 MG tablet Take 1 tablet (4 mg total) by mouth every 8 (eight) hours as needed for nausea or vomiting. 20 tablet 1  . oxyCODONE-acetaminophen (PERCOCET/ROXICET) 5-325 MG tablet Take 1 tablet by mouth at bedtime as needed (pain).     . promethazine (PHENERGAN) 12.5 MG tablet Take 12.5-25 mg by mouth every 6 (six) hours as needed for nausea or vomiting.    . traZODone (DESYREL) 50 MG tablet Take 50 mg by mouth at bedtime as needed for sleep.       Results for orders placed or performed during the hospital encounter of 09/21/20 (from the past 48 hour(s))  Glucose, capillary     Status: Abnormal   Collection Time: 09/21/20  7:16 AM  Result Value Ref Range   Glucose-Capillary 117 (H) 70 - 99 mg/dL    Comment: Glucose reference range applies only to samples taken after fasting for at least 8 hours.   No results found.  Review of Systems  Constitutional: Negative.   HENT: Positive for facial swelling.   Respiratory: Negative.   Cardiovascular: Negative.     Blood pressure (!) 148/77, pulse 82, temperature (!) 97.1 F (36.2 C), temperature source Temporal, resp. rate 18, height 5\' 9"  (1.753 m), weight 99.8 kg, SpO2 96 %. Physical Exam Constitutional:      Appearance: Normal appearance.  HENT:     Head:     Comments: Left parotid mass Cardiovascular:     Rate and Rhythm: Normal rate.     Pulses: Normal pulses.  Pulmonary:     Effort: Pulmonary effort is normal.  Musculoskeletal:     Cervical back: Normal range of motion.  Neurological:     Mental Status: She is alert.      Assessment/Plan Admit patient for left  superficial parotidectomy with nerve monitoring under general anesthesia as an outpatient with overnight observation.  , MD 09/21/2020, 8:44 AM

## 2020-09-21 NOTE — Progress Notes (Signed)
  Postop check.  A little sleepy but easily aroused and awake and alert to communicate with me following that.  No specific complaints.  Surgical site looks excellent.  There is no swelling or hematoma.  Facial nerve function is nearly completely normal.  The drain is not holding a seal.  Stable postop.  Will change to wall suction for the night and plan on removing the drain in the morning before discharge.

## 2020-09-21 NOTE — Op Note (Signed)
Operative Note: PAROTIDECTOMY  Patient: Ashley [name]  Medical record number: [id number]  Date:09/21/2020  Pre-operative Indications: Left parotid Mass  Postoperative Indications: Same  Surgical Procedure: Left superficial Parotidectomy with NIMS Monitoring  Anesthesia: GET  Surgeon: Barbee Cough, M.D.  Assist: Damian Leavell, PA Ms. Nordbladh's assistance was required throughout the surgical procedure including surgical planning, retraction, management of bleeding and surgical decision-making throughout the operation.  Complications: None  EBL: 100cc   Brief History: The patient is a 63 y.o. female with a history of enlarging left parotid mass.  CT scan showed a soft tissue mass in the posterior aspect of the left parotid gland consistent with parotid tumor. Given the patient's history and findings I recommended left superficial parotidectomy with nerve monitoring under general anesthesia.  The  risks and benefits were discussed in detail with the patient and their family. They understand and agree with our plan for surgery which is scheduled at [facility] OR on an elective basis.  Surgical Procedure: The patient is brought to the operating room on 09/21/2020 and placed in supine position on the operating table. General endotracheal anesthesia was established without difficulty. When the patient was adequately anesthetized, surgical timeout was performed and correct identification of the patient and the surgical procedure.  The patient was injected with 4 cc of 1% lidocaine 1-100,000 dilution epinephrine.  The [employer]omed Nerve Integrity Monitoring System (NIMS) was placed and nerve monitoring was used throughout the facial nerve dissection component of the surgical procedure.  The patient was positioned and prepped and draped in sterile fashion.  With the patient prepped and positioned, left superficial parotidectomy was undertaken.  A curvilinear incision was created in  the preauricular skin crease and carried inferiorly around the earlobe and into the upper neck in a pre-existing skin crease using a #15 scalpel.  Subcutaneous soft tissue was then dissected and incised with Bovie electrocautery to the level of the superficial parotid fascia.  Dissection was carried along the superficial fascia from posterior to anterior elevating skin and muscle layer.  Dissection was then carried along the tragal cartilage from superficial to deep elevating the parotid gland anteriorly.  The inferior aspect of the incision was then dissected carefully, the anterior aspect of the sternocleidomastoid muscle was dissected and the parotid gland was reflected anteriorly.  The main trunk of the facial nerve was then dissected, the superior and inferior divisions of the facial nerve were then followed from proximal to distal identified preserving each of the nerve branches.  The NIMS monitor was used throughout this component of the surgical procedure.  The parotid mass was identified and dissected free from the surrounding normal-appearing parotid tissue using harmonic scalpel and bipolar cautery.  The parotid specimen was then completely resected and sent to pathology for gross microscopic evaluation.  The facial nerve was then stimulated using the NIMS probe and all branches stimulated appropriately at 0.5 mV.  The parotid bed was then irrigated, hemostasis was maintained with bipolar cautery.  A 7 French round drain was then sutured to the skin with 4-0 Ethilon suture and placed in the parotid bed through the parotid skin incision.  The patient's incision was then closed in multiple layers beginning with reapproximation of the periparotid fascia using interrupted 4-0 Vicryl.  The immediate subcutaneous tissue was closed with interrupted 5-0 Vicryl suture.  The final skin closure was obtained with Dermabond surgical glue.  An orogastric tube was passed and stomach contents were aspirated. Patient  was awakened from anesthetic  and transferred from the operating room to the recovery room in stable condition. There were no complications and blood loss was minimal.   Barbee Cough, M.D. [facility]

## 2020-09-21 NOTE — Transfer of Care (Signed)
Immediate Anesthesia Transfer of Care Note  Patient: Ashley [name]  Procedure(s) Performed: LEFT SUPERFICIAL PAROTIDECTOMY WITH NERVE MONITORING (Left Neck)  Patient Location: PACU  Anesthesia Type:General  Level of Consciousness: drowsy, patient cooperative and responds to stimulation  Airway & Oxygen Therapy: Patient Spontanous Breathing and Patient connected to face mask oxygen  Post-op Assessment: Report given to RN and Post -op Vital signs reviewed and stable  Post vital signs: Reviewed and stable  Last Vitals:  Vitals Value Taken Time  BP    Temp    Pulse    Resp    SpO2      Last Pain:  Vitals:   09/21/20 0732  TempSrc:   PainSc: 6          Complications: No complications documented.

## 2020-09-21 NOTE — Progress Notes (Signed)
Pt got her home CPAP. Set the home CPAP and pt said she will wear it by herself.

## 2020-09-21 NOTE — Anesthesia Postprocedure Evaluation (Signed)
Anesthesia Post Note  Patient: Jilliana Burkes  Procedure(s) Performed: LEFT SUPERFICIAL PAROTIDECTOMY WITH NERVE MONITORING (Left Neck)     Patient location during evaluation: PACU Anesthesia Type: General Level of consciousness: awake and alert Pain management: pain level controlled Vital Signs Assessment: post-procedure vital signs reviewed and stable Respiratory status: spontaneous breathing, nonlabored ventilation, respiratory function stable and patient connected to nasal cannula oxygen Cardiovascular status: blood pressure returned to baseline and stable Postop Assessment: no apparent nausea or vomiting Anesthetic complications: no   No complications documented.  Last Vitals:  Vitals:   09/21/20 1358 09/21/20 1434  BP:  (!) 142/86  Pulse:  91  Resp:  14  Temp: (!) 36.4 C 36.6 C  SpO2:  95%    Last Pain:  Vitals:   09/21/20 1434  TempSrc: Axillary  PainSc:                  Nelle Don Viviane Semidey

## 2020-09-21 NOTE — Anesthesia Preprocedure Evaluation (Addendum)
Anesthesia Evaluation  Patient identified by MRN, date of birth, ID band Patient awake    Reviewed: Patient's Chart, lab work & pertinent test results  History of Anesthesia Complications (+) POST - OP SPINAL HEADACHE  Airway Mallampati: II  TM Distance: >3 FB Neck ROM: Full    Dental  (+) Teeth Intact   Pulmonary asthma , sleep apnea and Continuous Positive Airway Pressure Ventilation , former smoker,    Pulmonary exam normal        Cardiovascular hypertension, Pt. on medications  Rhythm:Regular Rate:Normal     Neuro/Psych Anxiety Depression TIA   GI/Hepatic Neg liver ROS, hiatal hernia, GERD  Medicated and Controlled,  Endo/Other  negative endocrine ROS  Renal/GU negative Renal ROS  negative genitourinary   Musculoskeletal  (+) Arthritis , Osteoarthritis,  Fibromyalgia -  Abdominal (+)  Abdomen: soft. Bowel sounds: normal.  Peds  Hematology  (+) anemia ,   Anesthesia Other Findings   Reproductive/Obstetrics                            Anesthesia Physical Anesthesia Plan  ASA: III  Anesthesia Plan: General   Post-op Pain Management:    Induction: Intravenous  PONV Risk Score and Plan: 3 and Ondansetron, Dexamethasone and Treatment may vary due to age or medical condition  Airway Management Planned: Mask and Oral ETT  Additional Equipment: None  Intra-op Plan:   Post-operative Plan: Extubation in OR  Informed Consent: I have reviewed the patients History and Physical, chart, labs and discussed the procedure including the risks, benefits and alternatives for the proposed anesthesia with the patient or authorized representative who has indicated his/her understanding and acceptance.     Dental advisory given  Plan Discussed with: CRNA  Anesthesia Plan Comments: (Lab Results      Component                Value               Date                      WBC                       4.7                 09/19/2020                HGB                      12.1                09/19/2020                HCT                      39.5                09/19/2020                MCV                      89.8                09/19/2020                PLT  265                 09/19/2020          )        Anesthesia Quick Evaluation

## 2020-09-21 NOTE — Anesthesia Procedure Notes (Signed)
Procedure Name: Intubation Date/Time: 09/21/2020 9:24 AM Performed by: Rande Brunt, CRNA Pre-anesthesia Checklist: Patient identified, Emergency Drugs available, Suction available and Patient being monitored Patient Re-evaluated:Patient Re-evaluated prior to induction Oxygen Delivery Method: Circle System Utilized Preoxygenation: Pre-oxygenation with 100% oxygen Induction Type: IV induction Ventilation: Mask ventilation without difficulty Laryngoscope Size: Mac and 3 Grade View: Grade II Tube type: Oral Number of attempts: 1 Airway Equipment and Method: Stylet and Oral airway Placement Confirmation: ETT inserted through vocal cords under direct vision,  positive ETCO2 and breath sounds checked- equal and bilateral Secured at: 22 cm Tube secured with: Tape Dental Injury: Teeth and Oropharynx as per pre-operative assessment

## 2020-09-22 ENCOUNTER — Encounter (HOSPITAL_COMMUNITY): Payer: Self-pay | Admitting: Otolaryngology

## 2020-09-22 DIAGNOSIS — Z79899 Other long term (current) drug therapy: Secondary | ICD-10-CM | POA: Diagnosis not present

## 2020-09-22 DIAGNOSIS — R7303 Prediabetes: Secondary | ICD-10-CM | POA: Diagnosis not present

## 2020-09-22 DIAGNOSIS — I10 Essential (primary) hypertension: Secondary | ICD-10-CM | POA: Diagnosis not present

## 2020-09-22 DIAGNOSIS — Z87891 Personal history of nicotine dependence: Secondary | ICD-10-CM | POA: Diagnosis not present

## 2020-09-22 DIAGNOSIS — K118 Other diseases of salivary glands: Secondary | ICD-10-CM | POA: Diagnosis not present

## 2020-09-22 DIAGNOSIS — J45909 Unspecified asthma, uncomplicated: Secondary | ICD-10-CM | POA: Diagnosis not present

## 2020-09-22 DIAGNOSIS — Z91040 Latex allergy status: Secondary | ICD-10-CM | POA: Diagnosis not present

## 2020-09-22 NOTE — Discharge Summary (Signed)
Physician Discharge Summary  Patient ID: Ashley [name] MRN: [id number] DOB/AGE: 07/26/1956 64 y.o.  Admit date: 09/21/2020 Discharge date: 09/22/2020  Admission Diagnoses: Parotid mass  Discharge Diagnoses:  Principal Problem:   Mass of left parotid gland Active Problems:   Parotid mass   Discharged Condition: good  Hospital Course: No complications, trouble with drain accidentally breaking during the night but was able to be repaired.  Consults: none  Significant Diagnostic Studies: none  Treatments: surgery: Left superficial parotidectomy with facial nerve dissection  Discharge Exam: Blood pressure 115/70, pulse 80, temperature 98.1 F (36.7 C), temperature source Oral, resp. rate 17, height 5\' 9"  (1.753 m), weight 99.8 kg, SpO2 97 %. PHYSICAL EXAM: Awake and alert.  Facial nerve function is normal.  Incision looks excellent.  Drain removed.  Disposition: Discharge disposition: 01-Home or Self Care       Discharge Instructions    Diet - low sodium heart healthy   Complete by: As directed    Diet - low sodium heart healthy   Complete by: As directed    Discharge instructions   Complete by: As directed    1. Limited activity 2. Liquid and soft diet, advance as tolerated 3. May bathe and shower day after surgery 4. Wound care - Gentle cleaning with soap and water 5. DO NOT APPLY ANY OINTMENT 6. Elevate Head of Bed  Please contact [facility] [phone]) for any additional concerns.   Increase activity slowly   Complete by: As directed    Increase activity slowly   Complete by: As directed    No wound care   Complete by: As directed      Allergies as of 09/22/2020      Reactions   Latex Itching, Swelling, Other (See Comments)   redness   Sulfa Antibiotics Diarrhea, Nausea And Vomiting   Aspirin Other (See Comments)   Can cause her to bleed out due to G6 PD   Oxycodone Itching   "all over"   Tramadol Other (See Comments)   Makes  her feel "spaced out on street drugs"      Medication List    TAKE these medications   acetaminophen 650 MG CR tablet Commonly known as: TYLENOL Take 1,300 mg by mouth 2 (two) times daily as needed for pain.   albuterol 108 (90 Base) MCG/ACT inhaler Commonly known as: VENTOLIN HFA Inhale 1-2 puffs into the lungs every 6 (six) hours as needed for wheezing or shortness of breath.   atorvastatin 20 MG tablet Commonly known as: LIPITOR Take 20 mg by mouth daily.   baclofen 20 MG tablet Commonly known as: LIORESAL Take 1 tablet (20 mg total) by mouth 3 (three) times daily.   benzonatate 200 MG capsule Commonly known as: TESSALON Take 200 mg by mouth 3 (three) times daily as needed for cough.   budesonide-formoterol 80-4.5 MCG/ACT inhaler Commonly known as: Symbicort Inhale 2 puffs into the lungs 2 (two) times daily.   diphenhydrAMINE 25 MG tablet Commonly known as: BENADRYL Take 25 mg by mouth at bedtime as needed. Take with oxycodone/acetaminophen to prevent itching.   DULoxetine 60 MG capsule Commonly known as: CYMBALTA Take 60 mg by mouth daily.   DULoxetine 30 MG capsule Commonly known as: CYMBALTA Take 30 mg by mouth daily.   fluticasone 50 MCG/ACT nasal spray Commonly known as: FLONASE Place 2 sprays into both nostrils 2 (two) times daily.   gabapentin 800 MG tablet Commonly known as: NEURONTIN Take 1 tablet (800  mg total) by mouth 3 (three) times daily.   hydrochlorothiazide 12.5 MG tablet Commonly known as: HYDRODIURIL Take 12.5 mg by mouth 2 (two) times daily with breakfast and lunch.   HYDROcodone-acetaminophen 10-325 MG tablet Commonly known as: NORCO Take 1 tablet by mouth every 8 (eight) hours as needed. What changed:   when to take this  reasons to take this   HYDROcodone-acetaminophen 5-325 MG tablet Commonly known as: Norco 1 to 2 tablets as needed for moderate pain.  Recommend alternating Tylenol and Motrin, substitute  hydrocodone-acetaminophen for Tylenol if needed. What changed: You were already taking a medication with the same name, and this prescription was added. Make sure you understand how and when to take each.   LORazepam 1 MG tablet Commonly known as: ATIVAN Take 0.5-1 mg by mouth 2 (two) times daily as needed (stress/elevated blood pressure).   losartan 25 MG tablet Commonly known as: COZAAR Take 25 mg by mouth 2 (two) times daily with breakfast and lunch.   MAGNESIUM CITRATE PO Take 1 capsule by mouth daily.   methocarbamol 500 MG tablet Commonly known as: ROBAXIN Take 500 mg by mouth every 6 (six) hours as needed for muscle spasms.   montelukast 10 MG tablet Commonly known as: SINGULAIR Take 1 tablet (10 mg total) by mouth at bedtime.   multivitamin with minerals Tabs tablet Take 1 tablet by mouth daily at 3 pm. Centrum for Women   naproxen 500 MG EC tablet Commonly known as: EC NAPROSYN Take 500 mg by mouth 2 (two) times daily with a meal.   nortriptyline 25 MG capsule Commonly known as: PAMELOR Take 1 capsule (25 mg total) by mouth at bedtime.   ondansetron 4 MG tablet Commonly known as: ZOFRAN Take 1 tablet (4 mg total) by mouth every 8 (eight) hours as needed for nausea or vomiting.   oxyCODONE-acetaminophen 5-325 MG tablet Commonly known as: PERCOCET/ROXICET Take 1 tablet by mouth at bedtime as needed (pain).   pantoprazole 40 MG tablet Commonly known as: PROTONIX Take 40 mg by mouth 2 (two) times daily.   promethazine 12.5 MG tablet Commonly known as: PHENERGAN Take 12.5-25 mg by mouth every 6 (six) hours as needed for nausea or vomiting.   Systane 0.4-0.3 % Soln Generic drug: Polyethyl Glycol-Propyl Glycol Place 1 drop into both eyes every 6 (six) hours as needed (dry eyes).   traZODone 50 MG tablet Commonly known as: DESYREL Take 50 mg by mouth at bedtime as needed for sleep.       Follow-up Information    Osborn Coho, MD In 2 weeks.    Specialty: Otolaryngology Contact information: [address] [place] [address] [phone]               Signed: Serena Colonel 09/22/2020, 8:03 AM

## 2020-09-22 NOTE — Discharge Planning (Signed)
Patient discharged home in stable condition. Verbalizes understanding of all discharge instructions, including home medications and follow up appointments. 

## 2020-09-24 ENCOUNTER — Encounter (HOSPITAL_COMMUNITY): Payer: Self-pay | Admitting: Otolaryngology

## 2020-09-25 LAB — SURGICAL PATHOLOGY

## 2020-09-26 DIAGNOSIS — M180 Bilateral primary osteoarthritis of first carpometacarpal joints: Secondary | ICD-10-CM | POA: Diagnosis not present

## 2020-10-02 DIAGNOSIS — M791 Myalgia, unspecified site: Secondary | ICD-10-CM | POA: Diagnosis not present

## 2020-10-02 DIAGNOSIS — M5416 Radiculopathy, lumbar region: Secondary | ICD-10-CM | POA: Diagnosis not present

## 2020-10-02 DIAGNOSIS — M461 Sacroiliitis, not elsewhere classified: Secondary | ICD-10-CM | POA: Diagnosis not present

## 2020-10-08 DIAGNOSIS — G4733 Obstructive sleep apnea (adult) (pediatric): Secondary | ICD-10-CM | POA: Diagnosis not present

## 2020-10-15 DIAGNOSIS — M545 Low back pain, unspecified: Secondary | ICD-10-CM | POA: Diagnosis not present

## 2020-10-17 DIAGNOSIS — G4733 Obstructive sleep apnea (adult) (pediatric): Secondary | ICD-10-CM | POA: Diagnosis not present

## 2020-10-23 DIAGNOSIS — G4733 Obstructive sleep apnea (adult) (pediatric): Secondary | ICD-10-CM | POA: Diagnosis not present

## 2020-10-30 DIAGNOSIS — Z23 Encounter for immunization: Secondary | ICD-10-CM | POA: Diagnosis not present

## 2020-11-01 DIAGNOSIS — M791 Myalgia, unspecified site: Secondary | ICD-10-CM | POA: Diagnosis not present

## 2020-11-01 DIAGNOSIS — M461 Sacroiliitis, not elsewhere classified: Secondary | ICD-10-CM | POA: Diagnosis not present

## 2020-11-01 DIAGNOSIS — M5416 Radiculopathy, lumbar region: Secondary | ICD-10-CM | POA: Diagnosis not present

## 2020-11-16 DIAGNOSIS — M791 Myalgia, unspecified site: Secondary | ICD-10-CM | POA: Diagnosis not present

## 2020-11-16 DIAGNOSIS — M5416 Radiculopathy, lumbar region: Secondary | ICD-10-CM | POA: Diagnosis not present

## 2020-11-16 DIAGNOSIS — M461 Sacroiliitis, not elsewhere classified: Secondary | ICD-10-CM | POA: Diagnosis not present

## 2020-11-22 DIAGNOSIS — M461 Sacroiliitis, not elsewhere classified: Secondary | ICD-10-CM | POA: Diagnosis not present

## 2020-11-22 DIAGNOSIS — M5416 Radiculopathy, lumbar region: Secondary | ICD-10-CM | POA: Diagnosis not present

## 2020-11-22 DIAGNOSIS — M791 Myalgia, unspecified site: Secondary | ICD-10-CM | POA: Diagnosis not present

## 2020-11-27 DIAGNOSIS — M5416 Radiculopathy, lumbar region: Secondary | ICD-10-CM | POA: Diagnosis not present

## 2020-11-28 DIAGNOSIS — M461 Sacroiliitis, not elsewhere classified: Secondary | ICD-10-CM | POA: Diagnosis not present

## 2020-11-28 DIAGNOSIS — M5416 Radiculopathy, lumbar region: Secondary | ICD-10-CM | POA: Diagnosis not present

## 2020-11-28 DIAGNOSIS — M791 Myalgia, unspecified site: Secondary | ICD-10-CM | POA: Diagnosis not present

## 2020-12-06 DIAGNOSIS — M791 Myalgia, unspecified site: Secondary | ICD-10-CM | POA: Diagnosis not present

## 2020-12-06 DIAGNOSIS — M461 Sacroiliitis, not elsewhere classified: Secondary | ICD-10-CM | POA: Diagnosis not present

## 2020-12-06 DIAGNOSIS — M5416 Radiculopathy, lumbar region: Secondary | ICD-10-CM | POA: Diagnosis not present

## 2020-12-12 DIAGNOSIS — M791 Myalgia, unspecified site: Secondary | ICD-10-CM | POA: Diagnosis not present

## 2020-12-12 DIAGNOSIS — M461 Sacroiliitis, not elsewhere classified: Secondary | ICD-10-CM | POA: Diagnosis not present

## 2020-12-12 DIAGNOSIS — M5416 Radiculopathy, lumbar region: Secondary | ICD-10-CM | POA: Diagnosis not present

## 2020-12-19 DIAGNOSIS — M5416 Radiculopathy, lumbar region: Secondary | ICD-10-CM | POA: Diagnosis not present

## 2020-12-19 DIAGNOSIS — M461 Sacroiliitis, not elsewhere classified: Secondary | ICD-10-CM | POA: Diagnosis not present

## 2020-12-19 DIAGNOSIS — M791 Myalgia, unspecified site: Secondary | ICD-10-CM | POA: Diagnosis not present

## 2020-12-25 DIAGNOSIS — G4733 Obstructive sleep apnea (adult) (pediatric): Secondary | ICD-10-CM | POA: Diagnosis not present

## 2020-12-25 DIAGNOSIS — D649 Anemia, unspecified: Secondary | ICD-10-CM | POA: Diagnosis not present

## 2020-12-25 DIAGNOSIS — R5382 Chronic fatigue, unspecified: Secondary | ICD-10-CM | POA: Diagnosis not present

## 2020-12-25 DIAGNOSIS — Z23 Encounter for immunization: Secondary | ICD-10-CM | POA: Diagnosis not present

## 2020-12-27 ENCOUNTER — Telehealth: Payer: Self-pay | Admitting: Emergency Medicine

## 2020-12-27 NOTE — Telephone Encounter (Signed)
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    She stated that she has a letter typed up of what all needs to be in the letter that she got from the [facility].  She will upload this to mychart and send it in as an email.  RB please advise if you are ok to do this letter.  Thanks

## 2020-12-27 NOTE — Telephone Encounter (Signed)
I have called the pt and LM on VM for the pt to return her call to triage.

## 2020-12-27 NOTE — Telephone Encounter (Signed)
Pt returning a phone call. Pt can be reached at [phone].

## 2020-12-28 NOTE — Telephone Encounter (Signed)
Patient responded via MyChart. Below is her message:   "Good Afternoon,   I finally have the opportunity to send you this note.  Please pardon the delayed response.  The note needs to say that I have a serious health condition that increases my risk of becoming very sick if I contract COVID-19.  Working with the public and being in the environment with several others during the pandemic puts [name] at risk and is not advised.  Please let me know when the letter is ready.  I may be able to come to pick it up.  Thank you! Ashley Jones-Robinson DO Mar 23, 1956 [phone]"  RB, please advise if you are ok with [place] typing this letter.

## 2021-01-01 DIAGNOSIS — M720 Palmar fascial fibromatosis [Dupuytren]: Secondary | ICD-10-CM | POA: Diagnosis not present

## 2021-01-02 DIAGNOSIS — M791 Myalgia, unspecified site: Secondary | ICD-10-CM | POA: Diagnosis not present

## 2021-01-02 DIAGNOSIS — M5416 Radiculopathy, lumbar region: Secondary | ICD-10-CM | POA: Diagnosis not present

## 2021-01-02 DIAGNOSIS — M461 Sacroiliitis, not elsewhere classified: Secondary | ICD-10-CM | POA: Diagnosis not present

## 2021-01-02 NOTE — Telephone Encounter (Signed)
Yes, please do.  Her diagnosis is asthma. Thanks.

## 2021-01-03 ENCOUNTER — Encounter: Payer: Self-pay | Admitting: *Deleted

## 2021-01-03 NOTE — Telephone Encounter (Signed)
Letter has been written, signed by RB, and placed up front in file cabinet for pt to come by and pick up. Called and spoke with pt letting her know that the letter was ready for her to come by to pick up and she verbalized understanding. Nothing further needed.

## 2021-01-05 DIAGNOSIS — Z23 Encounter for immunization: Secondary | ICD-10-CM | POA: Diagnosis not present

## 2021-01-05 DIAGNOSIS — S61213A Laceration without foreign body of left middle finger without damage to nail, initial encounter: Secondary | ICD-10-CM | POA: Diagnosis not present

## 2021-01-08 DIAGNOSIS — M791 Myalgia, unspecified site: Secondary | ICD-10-CM | POA: Diagnosis not present

## 2021-01-08 DIAGNOSIS — M5416 Radiculopathy, lumbar region: Secondary | ICD-10-CM | POA: Diagnosis not present

## 2021-01-08 DIAGNOSIS — M461 Sacroiliitis, not elsewhere classified: Secondary | ICD-10-CM | POA: Diagnosis not present

## 2021-01-10 DIAGNOSIS — M791 Myalgia, unspecified site: Secondary | ICD-10-CM | POA: Diagnosis not present

## 2021-01-10 DIAGNOSIS — M5416 Radiculopathy, lumbar region: Secondary | ICD-10-CM | POA: Diagnosis not present

## 2021-01-10 DIAGNOSIS — M461 Sacroiliitis, not elsewhere classified: Secondary | ICD-10-CM | POA: Diagnosis not present

## 2021-01-11 ENCOUNTER — Telehealth: Payer: Self-pay | Admitting: Emergency Medicine

## 2021-01-11 NOTE — Telephone Encounter (Signed)
Letter has been re-printed, signed by RB and placed in the file cabinet up front. Patient is aware of this and will come by today to pick up the letter.   Nothing further needed at time of call.

## 2021-01-16 DIAGNOSIS — M5416 Radiculopathy, lumbar region: Secondary | ICD-10-CM | POA: Diagnosis not present

## 2021-01-16 DIAGNOSIS — M461 Sacroiliitis, not elsewhere classified: Secondary | ICD-10-CM | POA: Diagnosis not present

## 2021-01-16 DIAGNOSIS — M791 Myalgia, unspecified site: Secondary | ICD-10-CM | POA: Diagnosis not present

## 2021-01-22 DIAGNOSIS — G4733 Obstructive sleep apnea (adult) (pediatric): Secondary | ICD-10-CM | POA: Diagnosis not present

## 2021-01-23 DIAGNOSIS — M791 Myalgia, unspecified site: Secondary | ICD-10-CM | POA: Diagnosis not present

## 2021-01-23 DIAGNOSIS — M5416 Radiculopathy, lumbar region: Secondary | ICD-10-CM | POA: Diagnosis not present

## 2021-01-23 DIAGNOSIS — M461 Sacroiliitis, not elsewhere classified: Secondary | ICD-10-CM | POA: Diagnosis not present

## 2021-01-29 DIAGNOSIS — M5416 Radiculopathy, lumbar region: Secondary | ICD-10-CM | POA: Diagnosis not present

## 2021-01-29 DIAGNOSIS — M791 Myalgia, unspecified site: Secondary | ICD-10-CM | POA: Diagnosis not present

## 2021-01-29 DIAGNOSIS — M461 Sacroiliitis, not elsewhere classified: Secondary | ICD-10-CM | POA: Diagnosis not present

## 2021-01-30 ENCOUNTER — Other Ambulatory Visit: Payer: Self-pay | Admitting: Neurological Surgery

## 2021-01-30 DIAGNOSIS — M461 Sacroiliitis, not elsewhere classified: Secondary | ICD-10-CM

## 2021-01-30 DIAGNOSIS — M419 Scoliosis, unspecified: Secondary | ICD-10-CM | POA: Diagnosis not present

## 2021-01-30 DIAGNOSIS — M5416 Radiculopathy, lumbar region: Secondary | ICD-10-CM | POA: Diagnosis not present

## 2021-01-30 DIAGNOSIS — M5136 Other intervertebral disc degeneration, lumbar region: Secondary | ICD-10-CM | POA: Diagnosis not present

## 2021-01-30 DIAGNOSIS — M47816 Spondylosis without myelopathy or radiculopathy, lumbar region: Secondary | ICD-10-CM | POA: Diagnosis not present

## 2021-02-06 DIAGNOSIS — M5416 Radiculopathy, lumbar region: Secondary | ICD-10-CM | POA: Diagnosis not present

## 2021-02-06 DIAGNOSIS — M791 Myalgia, unspecified site: Secondary | ICD-10-CM | POA: Diagnosis not present

## 2021-02-06 DIAGNOSIS — M461 Sacroiliitis, not elsewhere classified: Secondary | ICD-10-CM | POA: Diagnosis not present

## 2021-02-13 DIAGNOSIS — M5416 Radiculopathy, lumbar region: Secondary | ICD-10-CM | POA: Diagnosis not present

## 2021-02-14 ENCOUNTER — Ambulatory Visit
Admission: RE | Admit: 2021-02-14 | Discharge: 2021-02-14 | Disposition: A | Discharge status: Home / Self Care | Payer: Federal, State, Local not specified - PPO | Source: Ambulatory Visit | Attending: Neurological Surgery | Admitting: Neurological Surgery

## 2021-02-14 DIAGNOSIS — M461 Sacroiliitis, not elsewhere classified: Secondary | ICD-10-CM

## 2021-02-14 DIAGNOSIS — M47816 Spondylosis without myelopathy or radiculopathy, lumbar region: Secondary | ICD-10-CM | POA: Diagnosis not present

## 2021-02-14 DIAGNOSIS — M47818 Spondylosis without myelopathy or radiculopathy, sacral and sacrococcygeal region: Secondary | ICD-10-CM | POA: Diagnosis not present

## 2021-02-14 DIAGNOSIS — M533 Sacrococcygeal disorders, not elsewhere classified: Secondary | ICD-10-CM | POA: Diagnosis not present

## 2021-02-14 DIAGNOSIS — K429 Umbilical hernia without obstruction or gangrene: Secondary | ICD-10-CM | POA: Diagnosis not present

## 2021-02-20 DIAGNOSIS — M5416 Radiculopathy, lumbar region: Secondary | ICD-10-CM | POA: Diagnosis not present

## 2021-02-20 DIAGNOSIS — M461 Sacroiliitis, not elsewhere classified: Secondary | ICD-10-CM | POA: Diagnosis not present

## 2021-02-20 DIAGNOSIS — M791 Myalgia, unspecified site: Secondary | ICD-10-CM | POA: Diagnosis not present

## 2021-02-25 DIAGNOSIS — M461 Sacroiliitis, not elsewhere classified: Secondary | ICD-10-CM | POA: Diagnosis not present

## 2021-02-25 DIAGNOSIS — M419 Scoliosis, unspecified: Secondary | ICD-10-CM | POA: Diagnosis not present

## 2021-02-25 DIAGNOSIS — Z01419 Encounter for gynecological examination (general) (routine) without abnormal findings: Secondary | ICD-10-CM | POA: Diagnosis not present

## 2021-02-25 DIAGNOSIS — M5136 Other intervertebral disc degeneration, lumbar region: Secondary | ICD-10-CM | POA: Diagnosis not present

## 2021-02-25 DIAGNOSIS — M5416 Radiculopathy, lumbar region: Secondary | ICD-10-CM | POA: Diagnosis not present

## 2021-03-07 ENCOUNTER — Other Ambulatory Visit: Payer: Self-pay | Admitting: Family Medicine

## 2021-03-07 DIAGNOSIS — Z1231 Encounter for screening mammogram for malignant neoplasm of breast: Secondary | ICD-10-CM

## 2021-03-08 DIAGNOSIS — M461 Sacroiliitis, not elsewhere classified: Secondary | ICD-10-CM | POA: Diagnosis not present

## 2021-03-08 DIAGNOSIS — M791 Myalgia, unspecified site: Secondary | ICD-10-CM | POA: Diagnosis not present

## 2021-03-08 DIAGNOSIS — M5416 Radiculopathy, lumbar region: Secondary | ICD-10-CM | POA: Diagnosis not present

## 2021-03-13 ENCOUNTER — Ambulatory Visit: Payer: Federal, State, Local not specified - PPO | Admitting: Podiatry

## 2021-03-26 DIAGNOSIS — M79601 Pain in right arm: Secondary | ICD-10-CM | POA: Diagnosis not present

## 2021-03-26 DIAGNOSIS — M25561 Pain in right knee: Secondary | ICD-10-CM | POA: Diagnosis not present

## 2021-03-26 DIAGNOSIS — M1711 Unilateral primary osteoarthritis, right knee: Secondary | ICD-10-CM | POA: Diagnosis not present

## 2021-03-27 DIAGNOSIS — M5416 Radiculopathy, lumbar region: Secondary | ICD-10-CM | POA: Diagnosis not present

## 2021-04-03 DIAGNOSIS — J069 Acute upper respiratory infection, unspecified: Secondary | ICD-10-CM | POA: Diagnosis not present

## 2021-04-04 ENCOUNTER — Ambulatory Visit (INDEPENDENT_AMBULATORY_CARE_PROVIDER_SITE_OTHER): Payer: Federal, State, Local not specified - PPO

## 2021-04-04 ENCOUNTER — Ambulatory Visit: Payer: Federal, State, Local not specified - PPO | Admitting: Podiatry

## 2021-04-04 ENCOUNTER — Encounter: Payer: Self-pay | Admitting: Podiatry

## 2021-04-04 ENCOUNTER — Other Ambulatory Visit: Payer: Self-pay

## 2021-04-04 DIAGNOSIS — L600 Ingrowing nail: Secondary | ICD-10-CM

## 2021-04-04 DIAGNOSIS — M2042 Other hammer toe(s) (acquired), left foot: Secondary | ICD-10-CM

## 2021-04-04 DIAGNOSIS — M21372 Foot drop, left foot: Secondary | ICD-10-CM

## 2021-04-04 NOTE — Progress Notes (Addendum)
Subjective:   Patient ID: Ashley [name], female   DOB: 65 y.o.   MRN: [id number]   HPI Patient presents stating that she has had a lot of nerve problems with her back and has developed weakness in her left foot and ankle and has had a number of falls due to this and on the right she has 2 ingrown toenails which gets sore and make it hard for her to wear shoe gear comfortably.  States the left is gradually become more of an issue for her.  Patient states that when she walks on her left side due to the nerve problems with her back that she has a foot slap and that it is hard for her to be active   ROS      Objective:  Physical Exam  Neurovascular status intact with weakness of the left extensors and flexors with a bruise on the left big toe secondary to falling injury with digital deformities and on the right incurvated beds of the hallux second nail.  Patient is noted to have significant weakness of the anterior tibial tendon left when tested and when gait is evaluated she does have a slapping of her left foot     Assessment:  Foot drop left secondary to nerve compression creating unbalanced gait and inability to walk with any degree of comfort for stability and ingrown toenail right hallux second toe     Plan:  H&P reviewed both conditions and for her muscle issue left and tendon dysfunction I recommended an AFO brace and I am referring her to [facility] orthotist for evaluation.  For the right I went ahead today and I recommended correction of chronic ingrown toenail deformity and allowed her to read consent form for correction of hallux second nails and anesthetized each 60 mg like Marcaine mixture sterile prep done removed the nail spicule on both the hallux second toes exposed the base and applied phenol for applications 30 seconds to the right hallux 3 applications 30 seconds left second toe alcohol lavage sterile dressings and instructed on leaving dressings on 24 hours and taking them  off earlier if needed and to call with questions until we are able to get her in with [facility]otist for evaluation  X-rays indicate that there is some digital deformities left probably due to muscle imbalance tendon dysfunction

## 2021-04-04 NOTE — Patient Instructions (Signed)

## 2021-04-08 ENCOUNTER — Other Ambulatory Visit: Payer: Federal, State, Local not specified - PPO

## 2021-04-16 DIAGNOSIS — M5416 Radiculopathy, lumbar region: Secondary | ICD-10-CM | POA: Diagnosis not present

## 2021-04-16 DIAGNOSIS — Z981 Arthrodesis status: Secondary | ICD-10-CM | POA: Diagnosis not present

## 2021-04-16 DIAGNOSIS — M4316 Spondylolisthesis, lumbar region: Secondary | ICD-10-CM | POA: Diagnosis not present

## 2021-04-16 DIAGNOSIS — M48061 Spinal stenosis, lumbar region without neurogenic claudication: Secondary | ICD-10-CM | POA: Diagnosis not present

## 2021-04-16 DIAGNOSIS — R29898 Other symptoms and signs involving the musculoskeletal system: Secondary | ICD-10-CM | POA: Diagnosis not present

## 2021-04-19 ENCOUNTER — Ambulatory Visit (INDEPENDENT_AMBULATORY_CARE_PROVIDER_SITE_OTHER): Payer: Federal, State, Local not specified - PPO

## 2021-04-19 ENCOUNTER — Other Ambulatory Visit: Payer: Self-pay

## 2021-04-19 DIAGNOSIS — M21372 Foot drop, left foot: Secondary | ICD-10-CM

## 2021-04-23 DIAGNOSIS — G4733 Obstructive sleep apnea (adult) (pediatric): Secondary | ICD-10-CM | POA: Diagnosis not present

## 2021-04-23 NOTE — Progress Notes (Signed)
Patient seen by EJ with ohi in the office today. Office note scanned in chart under media for today's visit.

## 2021-04-26 ENCOUNTER — Telehealth: Payer: Self-pay | Admitting: Emergency Medicine

## 2021-04-26 NOTE — Telephone Encounter (Signed)
Called patient. She did not answer. Left message for her to call back.  

## 2021-04-26 NOTE — Telephone Encounter (Signed)
Continued  Describe the impairment, nature of severity and duration of impairment, the activity that impairment limits, and extent to which limits the employees ability to perform the activity. Would like the letter uploaded to her mychart.

## 2021-04-30 ENCOUNTER — Other Ambulatory Visit: Payer: Self-pay | Admitting: Physician Assistant

## 2021-04-30 ENCOUNTER — Telehealth: Payer: Self-pay | Admitting: Podiatry

## 2021-04-30 DIAGNOSIS — M5416 Radiculopathy, lumbar region: Secondary | ICD-10-CM

## 2021-04-30 DIAGNOSIS — Z981 Arthrodesis status: Secondary | ICD-10-CM

## 2021-04-30 DIAGNOSIS — M48061 Spinal stenosis, lumbar region without neurogenic claudication: Secondary | ICD-10-CM

## 2021-04-30 NOTE — Telephone Encounter (Signed)
Called and spoke to pt. Pt states she needs another letter based off the 01/03/21 letter that was written for her work. Pt states she needs another letter explaining the following:  Describe the impairment, nature of severity and duration of impairment, the activity that impairment limits, and extent to which limits the employees ability to perform the activity  Pt last seen in 06/2020 by Buelah Manis, NP, and 04/2018 by Dr. Delton Coombes.   Dr. Delton Coombes, please advise. Thanks.

## 2021-04-30 NOTE — Telephone Encounter (Signed)
Per Windy Fast @ [employer] the brace codes(L1970,L2820/L2330) are valid and billable and no authorization is needed. Covered at 70% of allowable. Reference # [id number].Marland Kitchen

## 2021-05-01 ENCOUNTER — Ambulatory Visit
Admission: RE | Admit: 2021-05-01 | Discharge: 2021-05-01 | Disposition: A | Discharge status: Home / Self Care | Payer: Federal, State, Local not specified - PPO | Source: Ambulatory Visit | Attending: Family Medicine | Admitting: Family Medicine

## 2021-05-01 ENCOUNTER — Other Ambulatory Visit: Payer: Self-pay

## 2021-05-01 DIAGNOSIS — Z1231 Encounter for screening mammogram for malignant neoplasm of breast: Secondary | ICD-10-CM | POA: Diagnosis not present

## 2021-05-03 ENCOUNTER — Ambulatory Visit
Admission: RE | Admit: 2021-05-03 | Discharge: 2021-05-03 | Disposition: A | Discharge status: Home / Self Care | Payer: Federal, State, Local not specified - PPO | Source: Ambulatory Visit | Attending: Physician Assistant | Admitting: Physician Assistant

## 2021-05-03 DIAGNOSIS — Z981 Arthrodesis status: Secondary | ICD-10-CM

## 2021-05-03 DIAGNOSIS — M545 Low back pain, unspecified: Secondary | ICD-10-CM | POA: Diagnosis not present

## 2021-05-03 DIAGNOSIS — M48061 Spinal stenosis, lumbar region without neurogenic claudication: Secondary | ICD-10-CM

## 2021-05-03 NOTE — Telephone Encounter (Signed)
Her previous letters have stated that she has asthma and that she is high risk for problems if she catches COVID. This allowed them to accommodate her and let her work remotely. I'm not sure exactly what they need in this letter. She probably needs to be seen by RB or APP to prepare it.

## 2021-05-03 NOTE — Telephone Encounter (Signed)
Called and spoke with patient and she verbalized understanding. She will call back once she viewed her schedule for an appt.

## 2021-05-09 ENCOUNTER — Telehealth: Payer: Self-pay | Admitting: Emergency Medicine

## 2021-05-09 NOTE — Telephone Encounter (Signed)
I called and spoke with patient regarding message. She is wanting to be seen sooner and the soonest appt we have is 05/27/21 with Beth. She agreed to take that appt but wanted to know if we have a cancellation list where they call if someone cancels. I informed patient that we do not after I asked Fleet Contras who works upfront. Patient stated she was going to call and ask the front to write her name down. Patient verbalized understanding, nothing further needed.

## 2021-05-10 DIAGNOSIS — E041 Nontoxic single thyroid nodule: Secondary | ICD-10-CM | POA: Diagnosis not present

## 2021-05-10 DIAGNOSIS — R635 Abnormal weight gain: Secondary | ICD-10-CM | POA: Diagnosis not present

## 2021-05-10 DIAGNOSIS — E7439 Other disorders of intestinal carbohydrate absorption: Secondary | ICD-10-CM | POA: Diagnosis not present

## 2021-05-10 DIAGNOSIS — D582 Other hemoglobinopathies: Secondary | ICD-10-CM | POA: Diagnosis not present

## 2021-05-10 DIAGNOSIS — E669 Obesity, unspecified: Secondary | ICD-10-CM | POA: Diagnosis not present

## 2021-05-16 DIAGNOSIS — R200 Anesthesia of skin: Secondary | ICD-10-CM | POA: Diagnosis not present

## 2021-05-21 DIAGNOSIS — K219 Gastro-esophageal reflux disease without esophagitis: Secondary | ICD-10-CM | POA: Diagnosis not present

## 2021-05-21 DIAGNOSIS — D509 Iron deficiency anemia, unspecified: Secondary | ICD-10-CM | POA: Diagnosis not present

## 2021-05-21 DIAGNOSIS — K5900 Constipation, unspecified: Secondary | ICD-10-CM | POA: Diagnosis not present

## 2021-05-28 ENCOUNTER — Other Ambulatory Visit: Payer: Self-pay

## 2021-05-28 ENCOUNTER — Ambulatory Visit: Payer: Federal, State, Local not specified - PPO | Admitting: Primary Care

## 2021-05-28 ENCOUNTER — Encounter: Payer: Self-pay | Admitting: Primary Care

## 2021-05-28 DIAGNOSIS — J45909 Unspecified asthma, uncomplicated: Secondary | ICD-10-CM | POA: Diagnosis not present

## 2021-05-28 DIAGNOSIS — J301 Allergic rhinitis due to pollen: Secondary | ICD-10-CM | POA: Diagnosis not present

## 2021-05-28 NOTE — Progress Notes (Signed)
@Patient  ID: Ashley [name], female    DOB: 1955/11/04, 65 y.o.   MRN: [id number]  Chief Complaint  Patient presents with   Follow-up    Patient reports that she is following up for asthma.    Referring provider: Henrine Screwshacker, Robert, MD  HPI: 65 year old female, former smoker. PMH significant for HTN, intrinsic asthma, GERD, scatzki's ring, chronic lower back pain. Former patient of Dr. Kendrick FriesMcQuaid, last seen by Dr. Delton CoombesByrum in June 2019. Maintained on Symbicort.   Previous [facility] pulmonary encounter: 04/13/18- Dr. Delton CoombesByrum She complains of cough and shortness of breath. There is no wheezing. Associated symptoms include headaches. Pertinent negatives include no ear pain, fever, postnasal drip, rhinorrhea, sneezing, sore throat or trouble swallowing. Her past medical history is significant for asthma.    65 yo woman, former smoker (20 pk-yrs), hx HTN, Allergies, GERD. Has been given dx of asthma associated with her GERD.            ROV 04/13/18 --patient is a 65 year old woman with former tobacco and asthma.  Her asthma is impacted by both GERD and allergic rhinitis.  She was seen in April by Dr. Kendrick FriesMcQuaid at which time she had a significant increase in her allergic rhinitis symptoms that seem to result in some increased dyspnea and asthma symptoms as well.  She had cetirizine added to her Singulair and Flonase.  It was also discovered that she was using tap water for her nasal rinses and it was recommended that she change to distilled water.  Her Qvar was changed to Symbicort to manage the increased asthma symptoms.  She returns today for follow-up. She also notes that she had been started on lisinopril in April, then her PCP took her off lisinopril in early May, which has also helped. She tells me that she got the Prevnar-13 at Dr Recardo Evangelisthacker's office last Fall.                                                                06/26/2020 Patient presents today for acute sick visit. She has not been seen since  2019. She reports  Increased shortness of breath and cough x4 months. Cough is productive with thick yellow mucus. She lost her husband to hit and run last August and she is her mothers sole care giver. She has not been takign Qvar as prescribed. During last visit in 2019 she was given trial of Symbicort. Her breathing was better when she was in [place]. Symptoms are worse [place] here [place] since she returned in April.    05/28/2021 Patient presents today for annual follow-up/asthma. She is doing alright. She was recently treated for bronchitis symptoms in June but her cough returned shortly after. She is not currently on Symbicort 80mcg, she has been using Qvar as her maintenance inhaler. She continues to take flonase and Singulair as prescribed. She has no other significant respiratory symptoms.    Allergies  Allergen Reactions   Latex Itching, Swelling and Other (See Comments)    redness   Sulfa Antibiotics Diarrhea and Nausea And Vomiting   Aspirin Other (See Comments)    Can cause her to bleed out due to G6 PD   Oxycodone Itching    "all over"   Tramadol Other (See  Comments)    Makes her feel "spaced out on street drugs"    Immunization History  Administered Date(s) Administered   Influenza Split 09/03/2014   Influenza,inj,Quad PF,6+ Mos 08/06/2015, 08/27/2019   Influenza-Unspecified 08/11/2017   PFIZER(Purple Top)SARS-COV-2 Vaccination 02/16/2020, 03/08/2020, 10/30/2020   Pneumococcal Polysaccharide-23 08/06/2015   Tdap 04/16/2010    Past Medical History:  Diagnosis Date   Anemia    Anxiety 2020   Arthritis    Asthma 2013-2014   Depression    Dyspnea 2013   Fibromyalgia 09/04/2014   GERD (gastroesophageal reflux disease)    Headache    History of hiatal hernia    Hypertension    Lumbar radicular pain 09/04/2014   Pre-diabetes    Sacroiliac joint pain    nonunion of left SI joint   Sleep apnea 2016   Spinal headache    trouble waking    TIA (transient  ischemic attack)    Wears glasses     Tobacco History: Social History   Tobacco Use  Smoking Status Former   Packs/day: 0.75   Years: 24.00   Pack years: 18.00   Types: Cigarettes   Quit date: 11/03/1992   Years since quitting: 28.6  Smokeless Tobacco Never   Counseling given: Not Answered   Outpatient Medications Prior to Visit  Medication Sig Dispense Refill   acetaminophen (TYLENOL) 650 MG CR tablet Take 1,300 mg by mouth 2 (two) times daily as needed for pain.     albuterol (PROVENTIL HFA;VENTOLIN HFA) 108 (90 Base) MCG/ACT inhaler Inhale 1-2 puffs into the lungs every 6 (six) hours as needed for wheezing or shortness of breath. 16 g 6   atorvastatin (LIPITOR) 20 MG tablet Take 20 mg by mouth daily.      baclofen (LIORESAL) 20 MG tablet Take 1 tablet (20 mg total) by mouth 3 (three) times daily. 15 each 0   diphenhydrAMINE (BENADRYL) 25 MG tablet Take 25 mg by mouth at bedtime as needed. Take with oxycodone/acetaminophen to prevent itching.     DULoxetine (CYMBALTA) 30 MG capsule Take 30 mg by mouth daily.     DULoxetine (CYMBALTA) 60 MG capsule Take 60 mg by mouth daily.      fluticasone (FLONASE) 50 MCG/ACT nasal spray Place 2 sprays into both nostrils 2 (two) times daily. 48 g 3   gabapentin (NEURONTIN) 800 MG tablet Take 1 tablet (800 mg total) by mouth 3 (three) times daily. 90 tablet 5   hydrochlorothiazide (HYDRODIURIL) 12.5 MG tablet Take 12.5 mg by mouth 2 (two) times daily with breakfast and lunch.   1   HYDROcodone-acetaminophen (NORCO) 10-325 MG tablet Take 1 tablet by mouth every 8 (eight) hours as needed. (Patient taking differently: Take 1 tablet by mouth every 4 (four) hours as needed (pain).) 30 tablet 0   HYDROcodone-acetaminophen (NORCO) 5-325 MG tablet 1 to 2 tablets as needed for moderate pain.  Recommend alternating Tylenol and Motrin, substitute hydrocodone-acetaminophen for Tylenol if needed. 20 tablet 0   losartan (COZAAR) 25 MG tablet Take 25 mg by mouth  2 (two) times daily with breakfast and lunch.   1   methocarbamol (ROBAXIN) 500 MG tablet Take 500 mg by mouth every 6 (six) hours as needed for muscle spasms.      montelukast (SINGULAIR) 10 MG tablet Take 1 tablet (10 mg total) by mouth at bedtime. 90 tablet 0   Multiple Vitamin (MULTIVITAMIN WITH MINERALS) TABS tablet Take 1 tablet by mouth daily at 3 pm. [facility]  naproxen (EC NAPROSYN) 500 MG EC tablet Take 500 mg by mouth 2 (two) times daily with a meal.      nortriptyline (PAMELOR) 25 MG capsule Take 1 capsule (25 mg total) by mouth at bedtime. 30 capsule 0   ondansetron (ZOFRAN) 4 MG tablet Take 1 tablet (4 mg total) by mouth every 8 (eight) hours as needed for nausea or vomiting. 20 tablet 1   oxyCODONE-acetaminophen (PERCOCET/ROXICET) 5-325 MG tablet Take 1 tablet by mouth at bedtime as needed (pain).      pantoprazole (PROTONIX) 40 MG tablet Take 40 mg by mouth 2 (two) times daily.      Polyethyl Glycol-Propyl Glycol (SYSTANE) 0.4-0.3 % SOLN Place 1 drop into both eyes every 6 (six) hours as needed (dry eyes).     promethazine (PHENERGAN) 12.5 MG tablet Take 12.5-25 mg by mouth every 6 (six) hours as needed for nausea or vomiting.     traZODone (DESYREL) 50 MG tablet Take 50 mg by mouth at bedtime as needed for sleep.      beclomethasone (QVAR) 80 MCG/ACT inhaler Inhale into the lungs 2 (two) times daily.     benzonatate (TESSALON) 200 MG capsule Take 200 mg by mouth 3 (three) times daily as needed for cough.  (Patient not taking: Reported on 05/28/2021)     budesonide-formoterol (SYMBICORT) 80-4.5 MCG/ACT inhaler Inhale 2 puffs into the lungs 2 (two) times daily. (Patient not taking: Reported on 05/28/2021) 1 each 5   LORazepam (ATIVAN) 1 MG tablet Take 0.5-1 mg by mouth 2 (two) times daily as needed (stress/elevated blood pressure). (Patient not taking: Reported on 05/28/2021)     MAGNESIUM CITRATE PO Take 1 capsule by mouth daily. (Patient not taking: Reported on 05/28/2021)      No facility-administered medications prior to visit.      Review of Systems  Review of Systems  Constitutional: Negative.   Respiratory:  Positive for cough. Negative for chest tightness, shortness of breath and wheezing.     Physical Exam  BP 130/80 (BP Location: Left Arm, Patient Position: Sitting, Cuff Size: Normal)   Pulse 90   Temp 98.7 F (37.1 C) (Oral)   Ht 5\' 9"  (1.753 m)   Wt 228 lb 6.4 oz (103.6 kg)   SpO2 97%   BMI 33.73 kg/m  Physical Exam Constitutional:      Appearance: Normal appearance.  HENT:     Head: Normocephalic and atraumatic.     Mouth/Throat:     Mouth: Mucous membranes are moist.     Pharynx: Oropharynx is clear.  Cardiovascular:     Rate and Rhythm: Normal rate and regular rhythm.  Pulmonary:     Effort: Pulmonary effort is normal.     Breath sounds: Normal breath sounds. No wheezing or rales.  Neurological:     General: No focal deficit present.     Mental Status: She is alert and oriented to person, place, and time. Mental status is at baseline.  Psychiatric:        Mood and Affect: Mood normal.        Behavior: Behavior normal.        Thought Content: Thought content normal.        Judgment: Judgment normal.     Lab Results:  CBC    Component Value Date/Time   WBC 4.7 09/19/2020 0831   RBC 4.40 09/19/2020 0831   HGB 12.1 09/19/2020 0831   HCT 39.5 09/19/2020 0831   PLT 265 09/19/2020 0831  MCV 89.8 09/19/2020 0831   MCH 27.5 09/19/2020 0831   MCHC 30.6 09/19/2020 0831   RDW 14.5 09/19/2020 0831   LYMPHSABS 1.6 08/05/2018 0923   MONOABS 0.4 08/05/2018 0923   EOSABS 0.2 08/05/2018 0923   BASOSABS 0.0 08/05/2018 0923    BMET    Component Value Date/Time   NA 140 09/19/2020 0831   K 3.6 09/19/2020 0831   CL 102 09/19/2020 0831   CO2 29 09/19/2020 0831   GLUCOSE 122 (H) 09/19/2020 0831   BUN 8 09/19/2020 0831   CREATININE 0.92 09/19/2020 0831   CALCIUM 9.5 09/19/2020 0831   GFRNONAA >60 09/19/2020 0831   GFRAA  >60 08/05/2018 0923    BNP No results found for: BNP  ProBNP No results found for: PROBNP  Imaging: No results found.   Assessment & Plan:   Asthmatic bronchitis - Her cough seems to have flared off ICS/LABA, advised she stop Qvar and resume Symbicort 80cmg one-two puffs twice daily. Follow-up in 1 year with Dr. Delton Coombes.   Allergic rhinitis - Continue Flonase, Singulair and saline nasal rinses      Glenford Bayley, NP 06/04/2021

## 2021-05-28 NOTE — Patient Instructions (Addendum)
Recommendations: - Resume Symbicort 1-2 puff morning and evening - Take Mucinex 600-1200mg  twice a day with glass of water (help thin mucus) - Continue Singulair 10mg  at bedtime - Use saline nasal rinses twice a day; flonase as needed   Orders: - Needs new letter for job accomodation; Please specify that she has asthma, fibromyalgia and chronic fatigue syndrome   Follow-up: - 1 year with Dr. or NP

## 2021-06-04 MED ORDER — BUDESONIDE-FORMOTEROL FUMARATE 80-4.5 MCG/ACT IN AERO
2.0000 | INHALATION_SPRAY | Freq: Two times a day (BID) | RESPIRATORY_TRACT | 5 refills | Status: DC
Start: 2021-06-04 — End: 2022-10-24

## 2021-06-04 NOTE — Assessment & Plan Note (Addendum)
-   Her cough consistently seems to flare off ICS/LABA, advised she stop Qvar and resume Symbicort 80cmg one-two puffs twice daily. Follow-up in 1 year with Dr. Delton Coombes.

## 2021-06-04 NOTE — Assessment & Plan Note (Signed)
-   Continue Flonase, Singulair and saline nasal rinses

## 2021-06-13 DIAGNOSIS — M5416 Radiculopathy, lumbar region: Secondary | ICD-10-CM | POA: Diagnosis not present

## 2021-06-13 DIAGNOSIS — M48061 Spinal stenosis, lumbar region without neurogenic claudication: Secondary | ICD-10-CM | POA: Diagnosis not present

## 2021-06-13 DIAGNOSIS — Z981 Arthrodesis status: Secondary | ICD-10-CM | POA: Diagnosis not present

## 2021-06-18 ENCOUNTER — Other Ambulatory Visit: Payer: Self-pay | Admitting: Gastroenterology

## 2021-06-18 DIAGNOSIS — R1310 Dysphagia, unspecified: Secondary | ICD-10-CM

## 2021-06-21 ENCOUNTER — Ambulatory Visit
Admission: RE | Admit: 2021-06-21 | Discharge: 2021-06-21 | Disposition: A | Discharge status: Home / Self Care | Payer: Federal, State, Local not specified - PPO | Source: Ambulatory Visit | Attending: Gastroenterology | Admitting: Gastroenterology

## 2021-06-21 ENCOUNTER — Other Ambulatory Visit: Payer: Self-pay | Admitting: Gastroenterology

## 2021-06-21 ENCOUNTER — Other Ambulatory Visit: Payer: Federal, State, Local not specified - PPO

## 2021-06-21 DIAGNOSIS — K5900 Constipation, unspecified: Secondary | ICD-10-CM

## 2021-06-21 DIAGNOSIS — R109 Unspecified abdominal pain: Secondary | ICD-10-CM | POA: Diagnosis not present

## 2021-06-21 DIAGNOSIS — R1030 Lower abdominal pain, unspecified: Secondary | ICD-10-CM | POA: Diagnosis not present

## 2021-06-21 DIAGNOSIS — R1310 Dysphagia, unspecified: Secondary | ICD-10-CM | POA: Diagnosis not present

## 2021-06-21 DIAGNOSIS — K449 Diaphragmatic hernia without obstruction or gangrene: Secondary | ICD-10-CM | POA: Diagnosis not present

## 2021-06-28 DIAGNOSIS — R1310 Dysphagia, unspecified: Secondary | ICD-10-CM | POA: Diagnosis not present

## 2021-06-28 DIAGNOSIS — K5900 Constipation, unspecified: Secondary | ICD-10-CM | POA: Diagnosis not present

## 2021-06-28 DIAGNOSIS — K219 Gastro-esophageal reflux disease without esophagitis: Secondary | ICD-10-CM | POA: Diagnosis not present

## 2021-07-05 ENCOUNTER — Other Ambulatory Visit: Payer: Federal, State, Local not specified - PPO

## 2021-07-05 ENCOUNTER — Other Ambulatory Visit: Payer: Self-pay

## 2021-07-05 DIAGNOSIS — M21372 Foot drop, left foot: Secondary | ICD-10-CM | POA: Diagnosis not present

## 2021-07-19 DIAGNOSIS — G8918 Other acute postprocedural pain: Secondary | ICD-10-CM | POA: Diagnosis not present

## 2021-07-19 DIAGNOSIS — M5417 Radiculopathy, lumbosacral region: Secondary | ICD-10-CM | POA: Diagnosis not present

## 2021-07-19 DIAGNOSIS — M4807 Spinal stenosis, lumbosacral region: Secondary | ICD-10-CM | POA: Diagnosis not present

## 2021-07-19 DIAGNOSIS — Q7649 Other congenital malformations of spine, not associated with scoliosis: Secondary | ICD-10-CM | POA: Diagnosis not present

## 2021-07-19 DIAGNOSIS — M545 Low back pain, unspecified: Secondary | ICD-10-CM | POA: Diagnosis not present

## 2021-07-19 DIAGNOSIS — Z981 Arthrodesis status: Secondary | ICD-10-CM | POA: Diagnosis not present

## 2021-07-19 DIAGNOSIS — M79662 Pain in left lower leg: Secondary | ICD-10-CM | POA: Diagnosis not present

## 2021-07-19 DIAGNOSIS — M48061 Spinal stenosis, lumbar region without neurogenic claudication: Secondary | ICD-10-CM | POA: Diagnosis not present

## 2021-07-24 DIAGNOSIS — G4733 Obstructive sleep apnea (adult) (pediatric): Secondary | ICD-10-CM | POA: Diagnosis not present

## 2021-10-15 ENCOUNTER — Other Ambulatory Visit: Payer: Self-pay | Admitting: Pain Medicine

## 2021-10-15 DIAGNOSIS — M549 Dorsalgia, unspecified: Secondary | ICD-10-CM

## 2021-10-16 ENCOUNTER — Other Ambulatory Visit: Payer: Self-pay | Admitting: Pain Medicine

## 2021-10-16 DIAGNOSIS — M549 Dorsalgia, unspecified: Secondary | ICD-10-CM

## 2021-10-23 DIAGNOSIS — G4733 Obstructive sleep apnea (adult) (pediatric): Secondary | ICD-10-CM | POA: Diagnosis not present

## 2021-11-09 ENCOUNTER — Other Ambulatory Visit: Payer: Self-pay

## 2021-11-09 ENCOUNTER — Ambulatory Visit
Admission: RE | Admit: 2021-11-09 | Discharge: 2021-11-09 | Disposition: A | Discharge status: Home / Self Care | Payer: Federal, State, Local not specified - PPO | Source: Ambulatory Visit | Attending: Pain Medicine | Admitting: Pain Medicine

## 2021-11-09 DIAGNOSIS — M549 Dorsalgia, unspecified: Secondary | ICD-10-CM

## 2021-11-09 DIAGNOSIS — M545 Low back pain, unspecified: Secondary | ICD-10-CM | POA: Diagnosis not present

## 2021-11-09 DIAGNOSIS — M48061 Spinal stenosis, lumbar region without neurogenic claudication: Secondary | ICD-10-CM | POA: Diagnosis not present

## 2021-11-13 ENCOUNTER — Ambulatory Visit: Payer: Federal, State, Local not specified - PPO | Admitting: Psychiatry

## 2021-11-13 ENCOUNTER — Encounter: Payer: Self-pay | Admitting: Psychiatry

## 2021-11-13 ENCOUNTER — Other Ambulatory Visit: Payer: Self-pay | Admitting: Psychiatry

## 2021-11-13 VITALS — BP 124/75 | HR 79

## 2021-11-13 DIAGNOSIS — G43119 Migraine with aura, intractable, without status migrainosus: Secondary | ICD-10-CM

## 2021-11-13 DIAGNOSIS — G44311 Acute post-traumatic headache, intractable: Secondary | ICD-10-CM

## 2021-11-13 MED ORDER — AJOVY 225 MG/1.5ML ~~LOC~~ SOAJ: 1.0000 "pen " | SUBCUTANEOUS | 3 refills | Status: DC

## 2021-11-13 MED ORDER — GABAPENTIN 100 MG PO CAPS: 100.0000 mg | ORAL_CAPSULE | Freq: Three times a day (TID) | ORAL | 2 refills | Status: DC

## 2021-11-13 NOTE — Patient Instructions (Signed)
Take Ajovy once a month for headache prevention Increase gabapentin to 900 mg three times a day
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
## 2021-11-14 ENCOUNTER — Telehealth: Payer: Self-pay | Admitting: *Deleted

## 2021-11-14 ENCOUNTER — Other Ambulatory Visit: Payer: Self-pay | Admitting: Psychiatry

## 2021-11-14 ENCOUNTER — Telehealth: Payer: Self-pay | Admitting: Psychiatry

## 2021-11-14 MED ORDER — EMGALITY 120 MG/ML ~~LOC~~ SOAJ: 1.0000 "pen " | SUBCUTANEOUS | 2 refills | Status: DC

## 2021-11-14 NOTE — Telephone Encounter (Signed)
[employer] fed order sent to [facility], [facility] they will reach out to the patient to schedule.  °

## 2021-11-14 NOTE — Telephone Encounter (Signed)
Called [facility], spoke with Zella Ball and she stated they are waiting on PA for emgality. I informed her its approved till July. She ran a claim through and  verbalized understanding, appreciation.

## 2021-11-14 NOTE — Telephone Encounter (Signed)
Pt 'spharmacy waiting for PA for Ajovy at [facility] [phone]  Medicine came out before the needle broke the skin. Want to know if can get another sample. Would like a call from the nurse.

## 2021-11-14 NOTE — Telephone Encounter (Signed)
Emgality PA< key [id number]. The Prior Authorization request has been approved for Emgality 120MG /ML [place] SOAJ. The authorization is valid from 10/15/2021 through 05/13/2022.

## 2021-11-14 NOTE — Telephone Encounter (Signed)
Called [facility], pharmacy closed until 2 pm. Will call back.

## 2021-11-16 ENCOUNTER — Encounter (HOSPITAL_BASED_OUTPATIENT_CLINIC_OR_DEPARTMENT_OTHER): Payer: Self-pay

## 2021-11-16 ENCOUNTER — Other Ambulatory Visit: Payer: Self-pay

## 2021-11-16 ENCOUNTER — Emergency Department (HOSPITAL_BASED_OUTPATIENT_CLINIC_OR_DEPARTMENT_OTHER)
Admission: EM | Admit: 2021-11-16 | Discharge: 2021-11-17 | Disposition: A | Discharge status: Home / Self Care | Payer: Federal, State, Local not specified - PPO | Attending: Emergency Medicine | Admitting: Emergency Medicine

## 2021-11-16 DIAGNOSIS — S61012A Laceration without foreign body of left thumb without damage to nail, initial encounter: Secondary | ICD-10-CM | POA: Diagnosis not present

## 2021-11-16 DIAGNOSIS — W274XXA Contact with kitchen utensil, initial encounter: Secondary | ICD-10-CM | POA: Diagnosis not present

## 2021-11-16 DIAGNOSIS — Z79899 Other long term (current) drug therapy: Secondary | ICD-10-CM | POA: Diagnosis not present

## 2021-11-16 DIAGNOSIS — S60932A Unspecified superficial injury of left thumb, initial encounter: Secondary | ICD-10-CM | POA: Diagnosis not present

## 2021-11-16 DIAGNOSIS — Z91040 Latex allergy status: Secondary | ICD-10-CM | POA: Insufficient documentation

## 2021-11-16 NOTE — ED Triage Notes (Signed)
Pt injured her R thumb on a mandolin slicer. Pt has the skin of the thumb in a container sitting directly on ice. Pt had rubber bands wrappped tightly around thumb with tossie. Tissue removed from wound and moist bandage placed. Pt instructed to hold direct pressure.

## 2021-11-17 MED ORDER — GELATIN ABSORBABLE 12-7 MM EX MISC
1.0000 | Freq: Once | CUTANEOUS | Status: AC
  Administered 2021-11-17: 1 via TOPICAL
  Filled 2021-11-17: qty 1

## 2021-11-17 NOTE — ED Notes (Signed)
EMT-P provided AVS using Teachback Method. Patient verbalizes understanding of Discharge Instructions. Opportunity for Questioning and Answers were provided by EMT-P. Patient Discharged from ED.  ? ?

## 2021-11-17 NOTE — ED Provider Notes (Signed)
[facility] EMERGENCY DEPT Provider Note   CSN: [id number] Arrival date & time: 11/16/21  2058     History  Chief Complaint  Patient presents with   Finger Injury    Ashley [name] is a 66 y.o. female.  Patient is a 66 year old female with history of fibromyalgia, chronic low back pain, migraines.  Patient presenting today for a left thumb laceration.  Patient was using a mandolin slicer when her hand slipped and sliced a piece of the tissue off of her left thumb.  This is located on the finger pad just adjacent to the nail.  Bleeding controlled with dressing and Coban and application in triage.  The history is provided by the patient.      Home Medications Prior to Admission medications   Medication Sig Start Date End Date Taking? Authorizing Provider  acetaminophen (TYLENOL) 650 MG CR tablet Take 1,300 mg by mouth 2 (two) times daily as needed for pain.    [provider]  albuterol (PROVENTIL HFA;VENTOLIN HFA) 108 (90 Base) MCG/ACT inhaler Inhale 1-2 puffs into the lungs every 6 (six) hours as needed for wheezing or shortness of breath. 07/28/17   Collene Gobble, MD  atorvastatin (LIPITOR) 20 MG tablet Take 20 mg by mouth daily.  02/17/19   [provider]  baclofen (LIORESAL) 20 MG tablet Take 1 tablet (20 mg total) by mouth 3 (three) times daily. 07/01/15   Clayton Bibles, PA-C  BELBUCA 150 MCG FILM Take 1 strip by mouth 2 (two) times daily. 07/05/21   [provider]  budesonide-formoterol (SYMBICORT) 80-4.5 MCG/ACT inhaler Inhale 2 puffs into the lungs 2 (two) times daily. 06/04/21   Martyn Ehrich, NP  diphenhydrAMINE (BENADRYL) 25 MG tablet Take 25 mg by mouth at bedtime as needed. Take with oxycodone/acetaminophen to prevent itching.    [provider]  DULoxetine (CYMBALTA) 30 MG capsule Take 30 mg by mouth daily. 08/22/20   [provider]  DULoxetine (CYMBALTA) 60 MG capsule Take 60 mg by mouth daily.      [provider]  fluticasone (FLONASE) 50 MCG/ACT nasal spray Place 2 sprays into both nostrils 2 (two) times daily. 08/27/17   Collene Gobble, MD  gabapentin (NEURONTIN) 100 MG capsule Take 1 capsule (100 mg total) by mouth 3 (three) times daily. Take in addition to 800 mg tablets for a total of 900 mg three times a days 11/13/21   Genia Harold, MD  gabapentin (NEURONTIN) 800 MG tablet Take 1 tablet (800 mg total) by mouth 3 (three) times daily. 07/07/17   Kirsteins, Luanna Salk, MD  Galcanezumab-gnlm (EMGALITY) 120 MG/ML SOAJ Inject 1 pen into the skin every 30 (thirty) days. 12/15/21   Genia Harold, MD  hydrochlorothiazide (HYDRODIURIL) 12.5 MG tablet Take 12.5 mg by mouth 2 (two) times daily with breakfast and lunch.  07/23/18   [provider]  HYDROcodone-acetaminophen (NORCO) 10-325 MG tablet Take 1 tablet by mouth every 8 (eight) hours as needed. Patient taking differently: Take 1 tablet by mouth every 4 (four) hours as needed (pain). 01/26/19   Wallene Huh, DPM  HYDROcodone-acetaminophen (NORCO) 5-325 MG tablet 1 to 2 tablets as needed for moderate pain.  Recommend alternating Tylenol and Motrin, substitute hydrocodone-acetaminophen for Tylenol if needed. 09/21/20   Jerrell Belfast, MD  losartan (COZAAR) 25 MG tablet Take 25 mg by mouth 2 (two) times daily with breakfast and lunch.  07/23/18   [provider]  montelukast (SINGULAIR) 10 MG tablet Take  1 tablet (10 mg total) by mouth at bedtime. 04/27/19   Collene Gobble, MD  Multiple Vitamin (MULTIVITAMIN WITH MINERALS) TABS tablet Take 1 tablet by mouth daily at 3 pm. Centrum for Women    [provider]  naproxen (EC NAPROSYN) 500 MG EC tablet Take 500 mg by mouth 2 (two) times daily with a meal.  02/23/13   [provider]  nortriptyline (PAMELOR) 25 MG capsule Take 1 capsule (25 mg total) by mouth at bedtime. 04/03/17   Dennie Bible, NP  ondansetron (ZOFRAN) 4 MG tablet Take 1 tablet (4  mg total) by mouth every 8 (eight) hours as needed for nausea or vomiting. 04/21/16   Dennie Bible, NP  pantoprazole (PROTONIX) 40 MG tablet Take 40 mg by mouth 2 (two) times daily.     [provider]  Polyethyl Glycol-Propyl Glycol (SYSTANE) 0.4-0.3 % SOLN Place 1 drop into both eyes every 6 (six) hours as needed (dry eyes).    [provider]  promethazine (PHENERGAN) 12.5 MG tablet Take 12.5-25 mg by mouth every 6 (six) hours as needed for nausea or vomiting.    [provider]      Allergies    Latex, Sulfa antibiotics, Aspirin, Oxycodone, and Tramadol    Review of Systems   Review of Systems  All other systems reviewed and are negative.  Physical Exam Updated Vital Signs BP 109/76 (BP Location: Left Arm)    Pulse 90    Temp 98.3 F (36.8 C)    Resp 16    Ht 5\' 9"  (1.753 m)    Wt 95.3 kg    SpO2 100%    BMI 31.01 kg/m  Physical Exam Vitals and nursing note reviewed.  Constitutional:      General: She is not in acute distress.    Appearance: Normal appearance. She is not ill-appearing.  HENT:     Head: Normocephalic and atraumatic.  Pulmonary:     Effort: Pulmonary effort is normal.  Skin:    General: Skin is warm and dry.     Comments: The left thumb is noted to have an area of missing tissue with persistent bleeding.  This measures approximately 1 cm x 1.5 cm and located to the finger pad of the left thumb.  Neurological:     Mental Status: She is alert.    ED Results / Procedures / Treatments   Labs (all labs ordered are listed, but only abnormal results are displayed) Labs Reviewed - No data to display  EKG None  Radiology No results found.  Procedures Procedures    Medications Ordered in ED Medications  gelatin adsorbable (GELFOAM/SURGIFOAM) sponge 12-7 mm 1 each (has no administration in time range)    ED Course/ Medical Decision Making/ A&P  No sutures are indicated as this is an avulsion of tissue.  A Surgifoam  dressing will be applied and patient to leave this in place for the next day.  She is to perform dressing changes at home afterward.  Final Clinical Impression(s) / ED Diagnoses Final diagnoses:  None    Rx / DC Orders ED Discharge Orders     None         Veryl Speak, MD 11/17/21 [phone]

## 2021-11-17 NOTE — Discharge Instructions (Signed)
Leave the current dressing in place until Monday morning.  Local wound care with bacitracin and dressing changes twice daily starting Monday morning.  Return to the emergency department if you experience any new and/or concerning symptoms.

## 2021-11-19 DIAGNOSIS — S61101D Unspecified open wound of right thumb with damage to nail, subsequent encounter: Secondary | ICD-10-CM | POA: Diagnosis not present

## 2021-11-19 DIAGNOSIS — L03011 Cellulitis of right finger: Secondary | ICD-10-CM | POA: Diagnosis not present

## 2021-11-21 DIAGNOSIS — L03011 Cellulitis of right finger: Secondary | ICD-10-CM | POA: Diagnosis not present

## 2021-11-23 DIAGNOSIS — G4733 Obstructive sleep apnea (adult) (pediatric): Secondary | ICD-10-CM | POA: Diagnosis not present

## 2021-11-23 DIAGNOSIS — L03011 Cellulitis of right finger: Secondary | ICD-10-CM | POA: Diagnosis not present

## 2021-12-03 DIAGNOSIS — M961 Postlaminectomy syndrome, not elsewhere classified: Secondary | ICD-10-CM | POA: Diagnosis not present

## 2021-12-07 DIAGNOSIS — S61101D Unspecified open wound of right thumb with damage to nail, subsequent encounter: Secondary | ICD-10-CM | POA: Diagnosis not present

## 2021-12-07 DIAGNOSIS — L03011 Cellulitis of right finger: Secondary | ICD-10-CM | POA: Diagnosis not present

## 2021-12-11 ENCOUNTER — Ambulatory Visit
Admission: RE | Admit: 2021-12-11 | Discharge: 2021-12-11 | Disposition: A | Discharge status: Home / Self Care | Payer: Federal, State, Local not specified - PPO | Source: Ambulatory Visit | Attending: Psychiatry | Admitting: Psychiatry

## 2021-12-11 DIAGNOSIS — G43909 Migraine, unspecified, not intractable, without status migrainosus: Secondary | ICD-10-CM | POA: Diagnosis not present

## 2021-12-11 DIAGNOSIS — S0990XA Unspecified injury of head, initial encounter: Secondary | ICD-10-CM | POA: Diagnosis not present

## 2021-12-11 DIAGNOSIS — G44311 Acute post-traumatic headache, intractable: Secondary | ICD-10-CM

## 2021-12-14 ENCOUNTER — Other Ambulatory Visit: Payer: Self-pay | Admitting: Psychiatry

## 2021-12-16 ENCOUNTER — Encounter (HOSPITAL_COMMUNITY): Payer: Self-pay

## 2021-12-16 ENCOUNTER — Emergency Department (HOSPITAL_COMMUNITY): Payer: Federal, State, Local not specified - PPO

## 2021-12-16 ENCOUNTER — Emergency Department (HOSPITAL_COMMUNITY)
Admission: EM | Admit: 2021-12-16 | Discharge: 2021-12-16 | Disposition: A | Discharge status: Home / Self Care | Payer: Federal, State, Local not specified - PPO | Attending: Emergency Medicine | Admitting: Emergency Medicine

## 2021-12-16 ENCOUNTER — Other Ambulatory Visit: Payer: Self-pay

## 2021-12-16 DIAGNOSIS — M40204 Unspecified kyphosis, thoracic region: Secondary | ICD-10-CM | POA: Diagnosis not present

## 2021-12-16 DIAGNOSIS — Z91040 Latex allergy status: Secondary | ICD-10-CM | POA: Diagnosis not present

## 2021-12-16 DIAGNOSIS — M549 Dorsalgia, unspecified: Secondary | ICD-10-CM | POA: Diagnosis not present

## 2021-12-16 DIAGNOSIS — M545 Low back pain, unspecified: Secondary | ICD-10-CM | POA: Diagnosis not present

## 2021-12-16 DIAGNOSIS — R200 Anesthesia of skin: Secondary | ICD-10-CM | POA: Diagnosis not present

## 2021-12-16 DIAGNOSIS — G8929 Other chronic pain: Secondary | ICD-10-CM | POA: Insufficient documentation

## 2021-12-16 DIAGNOSIS — M5459 Other low back pain: Secondary | ICD-10-CM | POA: Diagnosis not present

## 2021-12-16 LAB — URINALYSIS, ROUTINE W REFLEX MICROSCOPIC
Bilirubin Urine: NEGATIVE
Glucose, UA: NEGATIVE mg/dL
Hgb urine dipstick: NEGATIVE
Ketones, ur: NEGATIVE mg/dL
Leukocytes,Ua: NEGATIVE
Nitrite: NEGATIVE
Protein, ur: NEGATIVE mg/dL
Specific Gravity, Urine: 1.006 (ref 1.005–1.030)
pH: 6 (ref 5.0–8.0)

## 2021-12-16 LAB — BASIC METABOLIC PANEL
Anion gap: 9 (ref 5–15)
BUN: 10 mg/dL (ref 8–23)
CO2: 30 mmol/L (ref 22–32)
Calcium: 9.2 mg/dL (ref 8.9–10.3)
Chloride: 102 mmol/L (ref 98–111)
Creatinine, Ser: 0.81 mg/dL (ref 0.44–1.00)
GFR, Estimated: >60 mL/min (ref >60)
Glucose, Bld: 113 mg/dL — ABNORMAL HIGH (ref 70–99)
Potassium: 3.5 mmol/L (ref 3.5–5.1)
Sodium: 141 mmol/L (ref 135–145)

## 2021-12-16 LAB — CBC
HCT: 39.8 % (ref 36.0–46.0)
Hemoglobin: 12.6 g/dL (ref 12.0–15.0)
MCH: 28.3 pg (ref 26.0–34.0)
MCHC: 31.7 g/dL (ref 30.0–36.0)
MCV: 89.4 fL (ref 80.0–100.0)
Platelets: 216 10*3/uL (ref 150–400)
RBC: 4.45 MIL/uL (ref 3.87–5.11)
RDW: 13.3 % (ref 11.5–15.5)
WBC: 4.1 10*3/uL (ref 4.0–10.5)
nRBC: 0 % (ref 0.0–0.2)

## 2021-12-16 MED ORDER — DIAZEPAM 5 MG PO TABS
5.0000 mg | ORAL_TABLET | Freq: Once | ORAL | Status: AC
  Administered 2021-12-16: 5 mg via ORAL
  Filled 2021-12-16: qty 1

## 2021-12-16 MED ORDER — GADOBUTROL 1 MMOL/ML IV SOLN
10.0000 mL | Freq: Once | INTRAVENOUS | Status: AC | PRN
  Administered 2021-12-16: 10 mL via INTRAVENOUS

## 2021-12-16 NOTE — ED Provider Triage Note (Signed)
Emergency Medicine Provider Triage Evaluation Note  Ashley [name] , a 66 y.o. female  was evaluated in triage.  Pt complains of low back pain that is worsening since Thursday with associated loss of bowel and bladder control.  Associated with urinary frequency.  Subjective weakness as well.  Has history of spinal surgery.  Follows with neurosurgery at [facility].  Patient states she reached out to her neurosurgeon on Thursday who advised her to come to the emergency room that same day.  However patient states she waited to see if it improved on its own.  Denies fever, chills.  Without history of IV drug use.  Review of Systems  Positive: As above Negative: As above  Physical Exam  BP 118/73    Pulse 81    Temp 98.1 F (36.7 C) (Oral)    Resp 14    SpO2 94%  Gen:   Awake, no distress   Resp:  Normal effort  MSK:   Moves extremities without difficulty  Other:    Medical Decision Making  Medically screening exam initiated at 2:43 PM.  Appropriate orders placed.  Ashley [name] was informed that the remainder of the evaluation will be completed by another provider, this initial triage assessment does not replace that evaluation, and the importance of remaining in the ED until their evaluation is complete.  Notified nursing patient needs room asap.    Evlyn Courier, PA-C 12/16/21 1451

## 2021-12-16 NOTE — ED Provider Notes (Signed)
[facility] Provider Note   CSN: [id number] Arrival date & time: 12/16/21  1407     History  No chief complaint on file.   Ashley [name] is a 66 y.o. female.  Patient presents with a history of lower back pain complaining of worsening of lower back pain and new intermittent numbness in her groin area with loss of bowel or bladder ongoing for the past 3 to 4 days.  She called her neurosurgeons at [facility] and was advised to go to the ER however she decided to wait to see if it would get better.  Symptoms did not improve and she presents today.  Denies any recent fall or trauma.  Denies any fevers or cough or vomiting or diarrhea.      Home Medications Prior to Admission medications   Medication Sig Start Date End Date Taking? Authorizing Provider  acetaminophen (TYLENOL) 650 MG CR tablet Take 1,300 mg by mouth 2 (two) times daily as needed for pain.    [provider]  albuterol (PROVENTIL HFA;VENTOLIN HFA) 108 (90 Base) MCG/ACT inhaler Inhale 1-2 puffs into the lungs every 6 (six) hours as needed for wheezing or shortness of breath. 07/28/17   Collene Gobble, MD  atorvastatin (LIPITOR) 20 MG tablet Take 20 mg by mouth daily.  02/17/19   [provider]  baclofen (LIORESAL) 20 MG tablet Take 1 tablet (20 mg total) by mouth 3 (three) times daily. 07/01/15   Clayton Bibles, PA-C  BELBUCA 150 MCG FILM Take 1 strip by mouth 2 (two) times daily. 07/05/21   [provider]  budesonide-formoterol (SYMBICORT) 80-4.5 MCG/ACT inhaler Inhale 2 puffs into the lungs 2 (two) times daily. 06/04/21   Martyn Ehrich, NP  diphenhydrAMINE (BENADRYL) 25 MG tablet Take 25 mg by mouth at bedtime as needed. Take with oxycodone/acetaminophen to prevent itching.    [provider]  DULoxetine (CYMBALTA) 30 MG capsule Take 30 mg by mouth daily. 08/22/20   [provider]  DULoxetine (CYMBALTA) 60 MG capsule Take 60 mg by mouth  daily.     [provider]  fluticasone (FLONASE) 50 MCG/ACT nasal spray Place 2 sprays into both nostrils 2 (two) times daily. 08/27/17   Collene Gobble, MD  gabapentin (NEURONTIN) 100 MG capsule Take 1 capsule (100 mg total) by mouth 3 (three) times daily. Take in addition to 800 mg tablets for a total of 900 mg three times a days 11/13/21   Genia Harold, MD  gabapentin (NEURONTIN) 800 MG tablet Take 1 tablet (800 mg total) by mouth 3 (three) times daily. 07/07/17   Kirsteins, Luanna Salk, MD  Galcanezumab-gnlm (EMGALITY) 120 MG/ML SOAJ Inject 1 pen into the skin every 30 (thirty) days. 12/15/21   Genia Harold, MD  hydrochlorothiazide (HYDRODIURIL) 12.5 MG tablet Take 12.5 mg by mouth 2 (two) times daily with breakfast and lunch.  07/23/18   [provider]  HYDROcodone-acetaminophen (NORCO) 10-325 MG tablet Take 1 tablet by mouth every 8 (eight) hours as needed. Patient taking differently: Take 1 tablet by mouth every 4 (four) hours as needed (pain). 01/26/19   Wallene Huh, DPM  HYDROcodone-acetaminophen (NORCO) 5-325 MG tablet 1 to 2 tablets as needed for moderate pain.  Recommend alternating Tylenol and Motrin, substitute hydrocodone-acetaminophen for Tylenol if needed. 09/21/20   Jerrell Belfast, MD  losartan (COZAAR) 25 MG tablet Take 25 mg by mouth 2 (two) times daily with breakfast and lunch.  07/23/18   [provider]  montelukast (SINGULAIR) 10 MG tablet Take 1 tablet (10 mg total) by mouth at bedtime. 04/27/19   Leslye Peer, MD  Multiple Vitamin (MULTIVITAMIN WITH MINERALS) TABS tablet Take 1 tablet by mouth daily at 3 pm. [facility]    [provider]  naproxen (EC NAPROSYN) 500 MG EC tablet Take 500 mg by mouth 2 (two) times daily with a meal.  02/23/13   [provider]  nortriptyline (PAMELOR) 25 MG capsule Take 1 capsule (25 mg total) by mouth at bedtime. 04/03/17   Nilda Riggs, NP  ondansetron (ZOFRAN) 4 MG tablet Take 1  tablet (4 mg total) by mouth every 8 (eight) hours as needed for nausea or vomiting. 04/21/16   Nilda Riggs, NP  pantoprazole (PROTONIX) 40 MG tablet Take 40 mg by mouth 2 (two) times daily.     [provider]  Polyethyl Glycol-Propyl Glycol (SYSTANE) 0.4-0.3 % SOLN Place 1 drop into both eyes every 6 (six) hours as needed (dry eyes).    [provider]  promethazine (PHENERGAN) 12.5 MG tablet Take 12.5-25 mg by mouth every 6 (six) hours as needed for nausea or vomiting.    [provider]      Allergies    Latex, Sulfa antibiotics, Aspirin, Oxycodone, and Tramadol    Review of Systems   Review of Systems  Constitutional:  Negative for fever.  HENT:  Negative for ear pain.   Eyes:  Negative for pain.  Respiratory:  Negative for cough.   Cardiovascular:  Negative for chest pain.  Gastrointestinal:  Negative for abdominal pain.  Genitourinary:  Negative for flank pain.  Musculoskeletal:  Positive for back pain.  Skin:  Negative for rash.  Neurological:  Negative for headaches.   Physical Exam Updated Vital Signs BP 119/75 (BP Location: Right Arm)    Pulse 72    Temp 97.9 F (36.6 C) (Oral)    Resp 18    SpO2 92%  Physical Exam Constitutional:      General: She is not in acute distress.    Appearance: Normal appearance.  HENT:     Head: Normocephalic.     Nose: Nose normal.  Eyes:     Extraocular Movements: Extraocular movements intact.  Cardiovascular:     Rate and Rhythm: Normal rate.  Pulmonary:     Effort: Pulmonary effort is normal.  Genitourinary:    Comments: Rectal exam performed with nursing chaperone present.  Rectal tone is normal.  There is no saddle anesthesia patient is normal intact sensation of her perineum. Musculoskeletal:        General: Normal range of motion.     Cervical back: Normal range of motion.  Neurological:     General: No focal deficit present.     Mental Status: She is alert. Mental status is at baseline.      Cranial Nerves: No cranial nerve deficit.     Comments: Bilateral upper and lower extremity strength is otherwise 5/5 strength.    ED Results / Procedures / Treatments   Labs (all labs ordered are listed, but only abnormal results are displayed) Labs Reviewed  BASIC METABOLIC PANEL - Abnormal; Notable for the following components:      Result Value   Glucose, Bld 113 (*)    All other components within normal limits  CBC  URINALYSIS, ROUTINE W REFLEX MICROSCOPIC    EKG None  Radiology MR THORACIC SPINE W WO CONTRAST  Result Date: 12/16/2021  CLINICAL DATA:  Increased back pain and numbness, radiating down both legs EXAM: MRI THORACIC AND LUMBAR SPINE WITHOUT AND WITH CONTRAST TECHNIQUE: Multiplanar and multiecho pulse sequences of the thoracic and lumbar spine were obtained without and with intravenous contrast. CONTRAST:  101mL GADAVIST GADOBUTROL 1 MMOL/ML IV SOLN COMPARISON:  MRI thoracic and lumbar spine without contrast 11/09/2021 FINDINGS: MRI THORACIC SPINE FINDINGS Alignment: Scoliosis. No listhesis. Exaggeration of the normal thoracic kyphosis. Mental Vertebrae: No acute fracture or suspicious osseous lesion. No abnormal enhancement. Cord:  Normal signal and morphology.  No abnormal enhancement. Paraspinal and other soft tissues: Small hepatic cysts. Disc levels: No spinal canal stenosis. Left eccentric facet arthropathy at T9-T10 and T10-T11, which causes mild left neural foraminal narrowing at T9-T10 and moderate left neural foraminal narrowing at T10-T11, unchanged. MRI LUMBAR SPINE FINDINGS Segmentation:  Standard. Alignment:  Levoscoliosis.  No significant listhesis. Vertebrae: No acute fracture or suspicious osseous lesion. No abnormal enhancement. Left sacroiliac joint fixation hardware. Conus medullaris: Extends to the L1 level and appears normal. No abnormal enhancement. Paraspinal and other soft tissues: Small renal cysts. Otherwise negative. Disc levels: T12-L1: No  significant disc bulge. Right-greater-than-left facet arthropathy. No spinal canal stenosis or neural foraminal narrowing. L1-L2: No significant disc bulge. Facet arthropathy. No spinal canal stenosis or neural foraminal narrowing. L2-L3: No significant disc bulge. Right-greater-than-left facet arthropathy. No spinal canal stenosis or neural foraminal narrowing. L3-L4: No significant disc bulge. Facet arthropathy. Ligamentum flavum hypertrophy. No spinal canal stenosis or neural foraminal narrowing. L4-L5: Prior left hemilaminectomy, with some enhancing tissue about the thecal sac, likely scar tissue. Mild disc bulge, somewhat left eccentric. Severe facet arthropathy with fluid in the facets and widening. Right-greater-than-left ligamentum flavum hypertrophy. Mild-to-moderate spinal canal stenosis. Narrowing of the lateral recesses. Moderate left and mild right neural foraminal narrowing. This level appears unchanged compared to 11/09/2021. L5-S1: Mild disc bulge. Moderate facet arthropathy. Narrowing of the lateral recesses. No spinal canal stenosis or neural foraminal narrowing. IMPRESSION: 1. L4-L5 mild-to-moderate spinal canal stenosis, with moderate left and mild right neural foraminal narrowing, unchanged from the prior exam. Narrowing of the lateral recesses at this level could affect the descending L5 nerve roots. 2. Widening of the facets at L4-L5, which can indicate instability. 3. L5-S1 narrowing of the lateral recesses, which could affect the descending S1 nerve roots. 4. No spinal canal stenosis in the thoracic spine. Mild left neural foraminal narrowing at T9-T10 and moderate left neural foraminal narrowing at T10-T11, unchanged. 5. No abnormal osseous or spinal cord enhancement. Electronically Signed   By: Merilyn Baba M.D.   On: 12/16/2021 18:34   MR Lumbar Spine W Wo Contrast  Result Date: 12/16/2021 CLINICAL DATA:  Increased back pain and numbness, radiating down both legs EXAM: MRI THORACIC  AND LUMBAR SPINE WITHOUT AND WITH CONTRAST TECHNIQUE: Multiplanar and multiecho pulse sequences of the thoracic and lumbar spine were obtained without and with intravenous contrast. CONTRAST:  81mL GADAVIST GADOBUTROL 1 MMOL/ML IV SOLN COMPARISON:  MRI thoracic and lumbar spine without contrast 11/09/2021 FINDINGS: MRI THORACIC SPINE FINDINGS Alignment: Scoliosis. No listhesis. Exaggeration of the normal thoracic kyphosis. Mental Vertebrae: No acute fracture or suspicious osseous lesion. No abnormal enhancement. Cord:  Normal signal and morphology.  No abnormal enhancement. Paraspinal and other soft tissues: Small hepatic cysts. Disc levels: No spinal canal stenosis. Left eccentric facet arthropathy at T9-T10 and T10-T11, which causes mild left neural foraminal narrowing at T9-T10 and moderate left neural foraminal narrowing at T10-T11, unchanged. MRI LUMBAR SPINE FINDINGS  Segmentation:  Standard. Alignment:  Levoscoliosis.  No significant listhesis. Vertebrae: No acute fracture or suspicious osseous lesion. No abnormal enhancement. Left sacroiliac joint fixation hardware. Conus medullaris: Extends to the L1 level and appears normal. No abnormal enhancement. Paraspinal and other soft tissues: Small renal cysts. Otherwise negative. Disc levels: T12-L1: No significant disc bulge. Right-greater-than-left facet arthropathy. No spinal canal stenosis or neural foraminal narrowing. L1-L2: No significant disc bulge. Facet arthropathy. No spinal canal stenosis or neural foraminal narrowing. L2-L3: No significant disc bulge. Right-greater-than-left facet arthropathy. No spinal canal stenosis or neural foraminal narrowing. L3-L4: No significant disc bulge. Facet arthropathy. Ligamentum flavum hypertrophy. No spinal canal stenosis or neural foraminal narrowing. L4-L5: Prior left hemilaminectomy, with some enhancing tissue about the thecal sac, likely scar tissue. Mild disc bulge, somewhat left eccentric. Severe facet  arthropathy with fluid in the facets and widening. Right-greater-than-left ligamentum flavum hypertrophy. Mild-to-moderate spinal canal stenosis. Narrowing of the lateral recesses. Moderate left and mild right neural foraminal narrowing. This level appears unchanged compared to 11/09/2021. L5-S1: Mild disc bulge. Moderate facet arthropathy. Narrowing of the lateral recesses. No spinal canal stenosis or neural foraminal narrowing. IMPRESSION: 1. L4-L5 mild-to-moderate spinal canal stenosis, with moderate left and mild right neural foraminal narrowing, unchanged from the prior exam. Narrowing of the lateral recesses at this level could affect the descending L5 nerve roots. 2. Widening of the facets at L4-L5, which can indicate instability. 3. L5-S1 narrowing of the lateral recesses, which could affect the descending S1 nerve roots. 4. No spinal canal stenosis in the thoracic spine. Mild left neural foraminal narrowing at T9-T10 and moderate left neural foraminal narrowing at T10-T11, unchanged. 5. No abnormal osseous or spinal cord enhancement. Electronically Signed   By: Merilyn Baba M.D.   On: 12/16/2021 18:34    Procedures Procedures    Medications Ordered in ED Medications  diazepam (VALIUM) tablet 5 mg (5 mg Oral Given 12/16/21 1632)  gadobutrol (GADAVIST) 1 MMOL/ML injection 10 mL (10 mLs Intravenous Contrast Given 12/16/21 1750)    ED Course/ Medical Decision Making/ A&P                           Medical Decision Making Amount and/or Complexity of Data Reviewed Labs: ordered. Radiology: ordered.  Risk Prescription drug management.   Review of records shows office visit December 03, 2021 for back pain.  Work-up today included lab work that was unremarkable.  Urinalysis ordered and pending.  MRI of the T and L-spine ordered as well.  MRI appears very similar to prior MRI a month ago.  Given the patient is able to ambulate has normal rectal tone no saddle anesthesia I doubt cauda  equina.  Recommending outpatient follow-up with her [facility] neurosurgeons this week.  Advised return if she has worsening symptoms worsening weakness or numbness or any additional concerns.         Final Clinical Impression(s) / ED Diagnoses Final diagnoses:  Chronic midline low back pain without sciatica    Rx / DC Orders ED Discharge Orders     None         Luna Fuse, MD 12/16/21 2000

## 2021-12-16 NOTE — ED Triage Notes (Signed)
Patient complains of increased back pain with pain and numbness radiating down both legs. Has disc disease with fusion and for the past 4 days unable to control bowels with increased weakness, difficulty ambulating and diaphoresis

## 2021-12-16 NOTE — ED Notes (Signed)
Patient transported to MRI 

## 2021-12-16 NOTE — Discharge Instructions (Signed)
Call your neurosurgeon in 2-3 days.  Return immediately back to the ER if:  Your symptoms worsen within the next 12-24 hours. You develop new symptoms such as new fevers, persistent vomiting, new pain, shortness of breath, or new weakness or numbness, or if you have any other concerns.

## 2021-12-17 ENCOUNTER — Other Ambulatory Visit: Payer: Self-pay | Admitting: Psychiatry

## 2021-12-20 DIAGNOSIS — E785 Hyperlipidemia, unspecified: Secondary | ICD-10-CM | POA: Diagnosis not present

## 2021-12-20 DIAGNOSIS — R7303 Prediabetes: Secondary | ICD-10-CM | POA: Diagnosis not present

## 2021-12-20 DIAGNOSIS — I10 Essential (primary) hypertension: Secondary | ICD-10-CM | POA: Diagnosis not present

## 2021-12-20 DIAGNOSIS — D509 Iron deficiency anemia, unspecified: Secondary | ICD-10-CM | POA: Diagnosis not present

## 2021-12-20 DIAGNOSIS — F4323 Adjustment disorder with mixed anxiety and depressed mood: Secondary | ICD-10-CM | POA: Diagnosis not present

## 2021-12-23 DIAGNOSIS — G8929 Other chronic pain: Secondary | ICD-10-CM | POA: Diagnosis not present

## 2021-12-23 DIAGNOSIS — M5442 Lumbago with sciatica, left side: Secondary | ICD-10-CM | POA: Diagnosis not present

## 2021-12-23 DIAGNOSIS — M5441 Lumbago with sciatica, right side: Secondary | ICD-10-CM | POA: Diagnosis not present

## 2021-12-24 DIAGNOSIS — G4733 Obstructive sleep apnea (adult) (pediatric): Secondary | ICD-10-CM | POA: Diagnosis not present

## 2021-12-25 ENCOUNTER — Telehealth: Payer: Self-pay | Admitting: Psychiatry

## 2021-12-25 NOTE — Telephone Encounter (Signed)
Contacted pharmacy for verification as medication was sent in Jan. Spoke to jim, he informed me they do have the Rx and will get it filled for pt.

## 2021-12-25 NOTE — Telephone Encounter (Signed)
Pt called stating that the Pharmacy [facility] [address] is telling her she does not have refills for her Galcanezumab-gnlm (EMGALITY) 120 MG/ML SOAJ Pt is needing this called in for her. Please advise.

## 2021-12-31 DIAGNOSIS — U071 COVID-19: Secondary | ICD-10-CM | POA: Diagnosis not present

## 2022-01-06 DIAGNOSIS — M5416 Radiculopathy, lumbar region: Secondary | ICD-10-CM | POA: Diagnosis not present

## 2022-01-06 DIAGNOSIS — M47816 Spondylosis without myelopathy or radiculopathy, lumbar region: Secondary | ICD-10-CM | POA: Diagnosis not present

## 2022-01-06 IMAGING — CR DG CHEST 2V
2 series · 2 of 2 positions shown · non-contrast
Comparison: Prior chest radiographs 12/22/2017 and earlier

CLINICAL DATA: Pain. Additional provided: Patient reports extreme
left shoulder pain, limited range of motion in neck and pain upon
movement, patient reports history of fibromyalgia, scoliosis,
arthritis.

EXAM:
CHEST - 2 VIEW

[w chest pa]
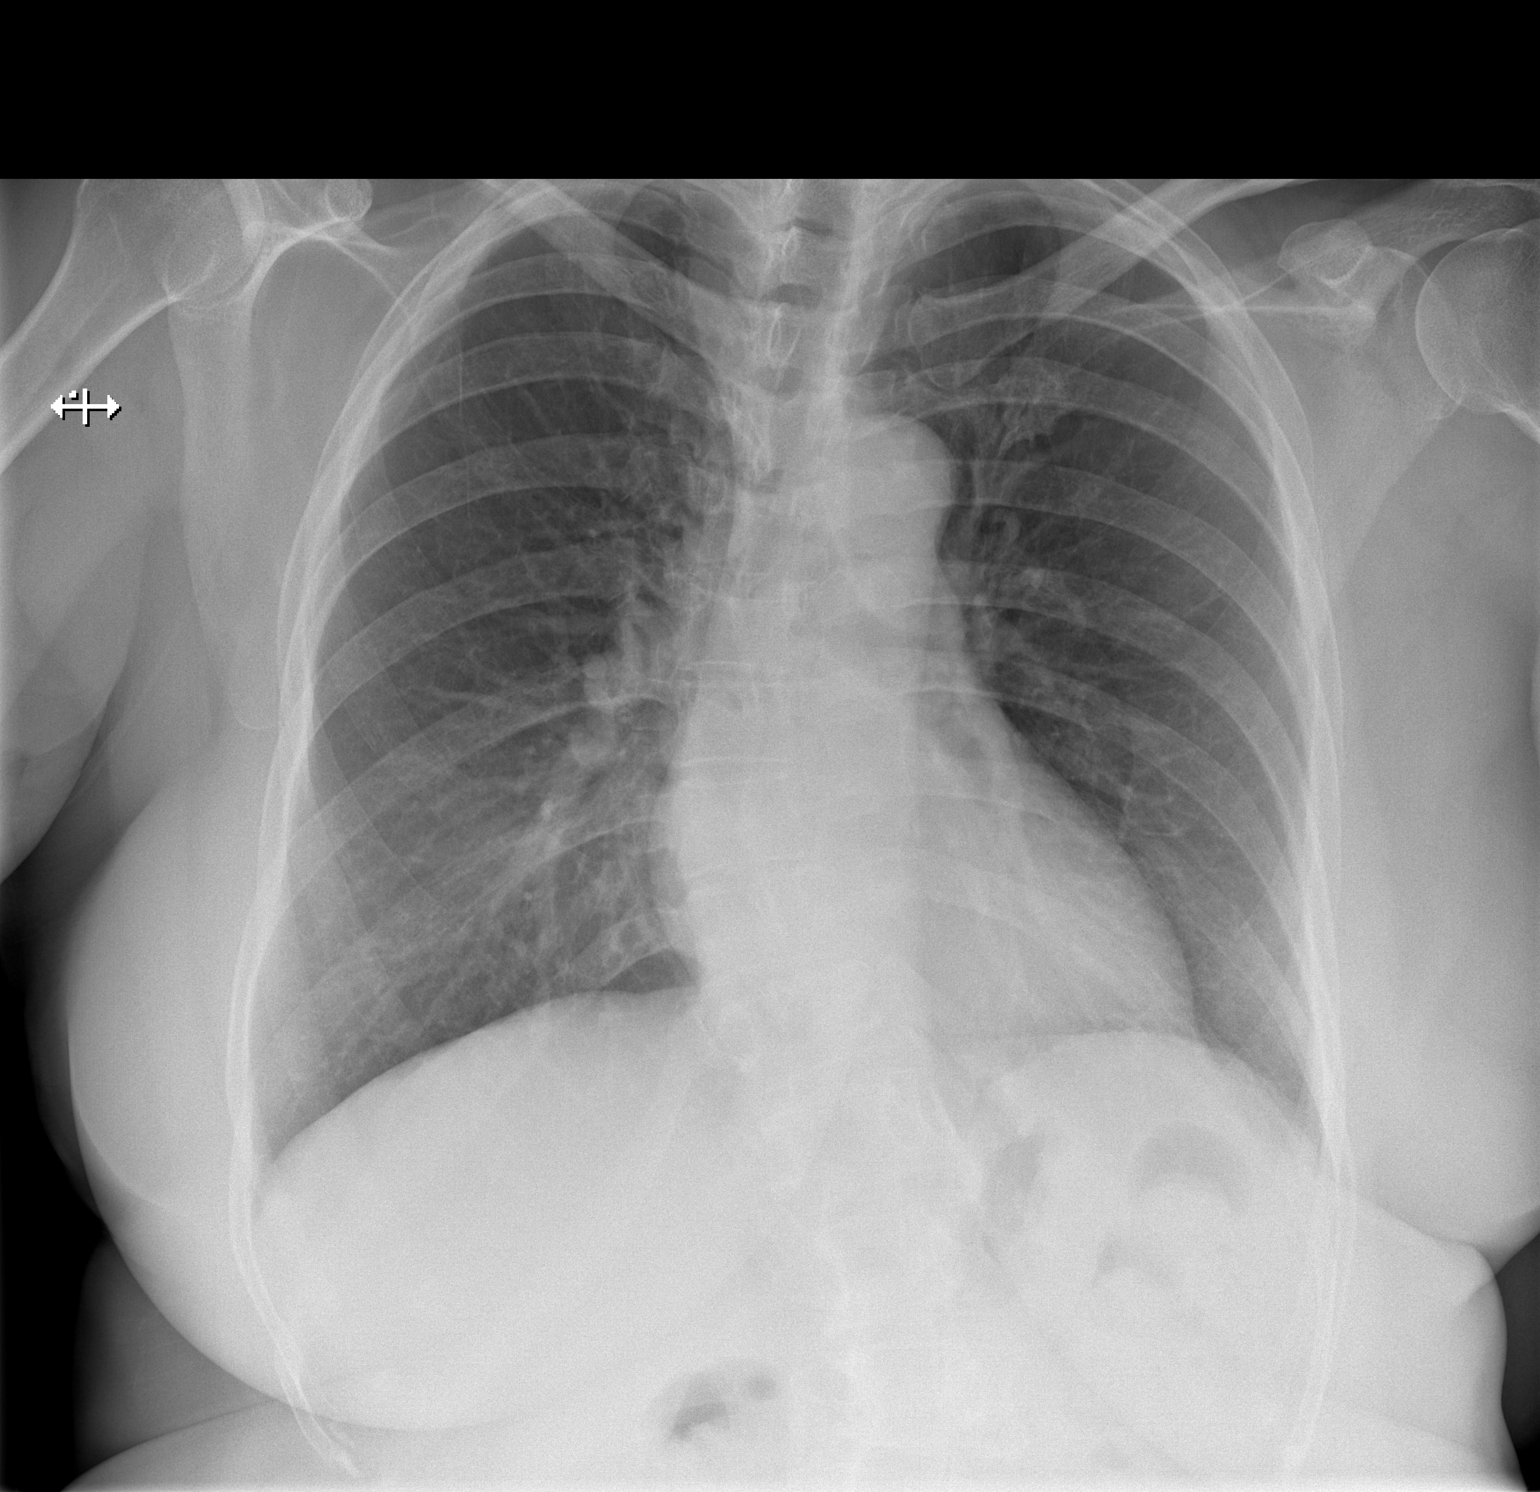

[w chest lat]
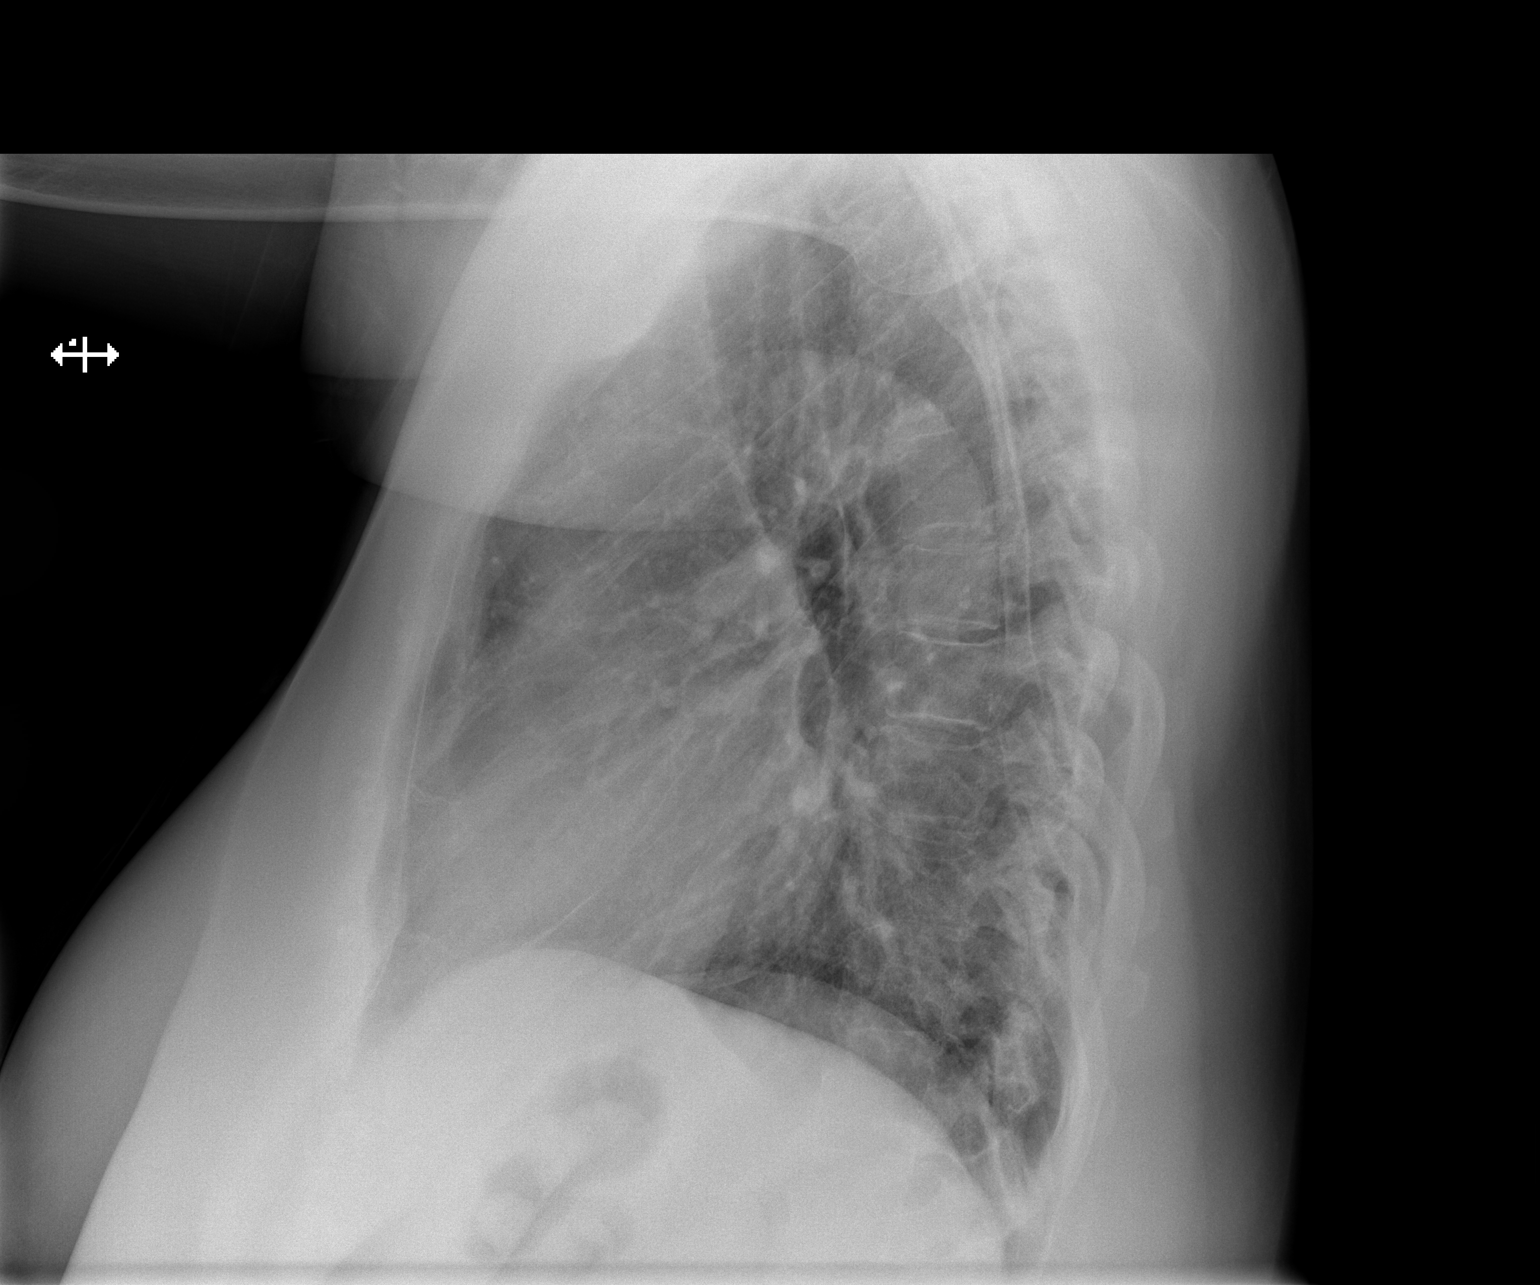

[2 of 2 positions shown; findings below may reference images not displayed]

FINDINGS: Heart size within normal limits. No appreciable airspace
consolidation or pulmonary edema. No evidence of pleural effusion or
pneumothorax. Redemonstrated thoracic dextrocurvature. No acute bony
abnormality identified
IMPRESSION: No evidence of active cardiopulmonary disease.

Redemonstrated thoracic dextrocurvature.

## 2022-01-07 DIAGNOSIS — Z981 Arthrodesis status: Secondary | ICD-10-CM | POA: Diagnosis not present

## 2022-01-22 DIAGNOSIS — G4733 Obstructive sleep apnea (adult) (pediatric): Secondary | ICD-10-CM | POA: Diagnosis not present

## 2022-01-23 DIAGNOSIS — G8929 Other chronic pain: Secondary | ICD-10-CM | POA: Diagnosis not present

## 2022-01-23 DIAGNOSIS — M5442 Lumbago with sciatica, left side: Secondary | ICD-10-CM | POA: Diagnosis not present

## 2022-01-23 DIAGNOSIS — M5441 Lumbago with sciatica, right side: Secondary | ICD-10-CM | POA: Diagnosis not present

## 2022-01-28 ENCOUNTER — Other Ambulatory Visit: Payer: Self-pay | Admitting: Psychiatry
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
## 2022-02-11 ENCOUNTER — Ambulatory Visit (INDEPENDENT_AMBULATORY_CARE_PROVIDER_SITE_OTHER): Payer: Federal, State, Local not specified - PPO | Admitting: Psychiatry

## 2022-02-11 ENCOUNTER — Encounter: Payer: Self-pay | Admitting: Psychiatry

## 2022-02-11 VITALS — BP 118/73 | HR 92 | Ht 68.0 in | Wt 219.0 lb

## 2022-02-11 DIAGNOSIS — H65195 Other acute nonsuppurative otitis media, recurrent, left ear: Secondary | ICD-10-CM | POA: Diagnosis not present

## 2022-02-11 DIAGNOSIS — G43119 Migraine with aura, intractable, without status migrainosus: Secondary | ICD-10-CM | POA: Diagnosis not present

## 2022-02-11 DIAGNOSIS — J029 Acute pharyngitis, unspecified: Secondary | ICD-10-CM | POA: Diagnosis not present

## 2022-02-11 MED ORDER — NURTEC 75 MG PO TBDP: 75.0000 mg | ORAL_TABLET | ORAL | 3 refills | Status: DC | PRN

## 2022-02-11 MED ORDER — EMGALITY 120 MG/ML ~~LOC~~ SOAJ: 1.0000 "pen " | SUBCUTANEOUS | 11 refills | Status: DC

## 2022-02-11 NOTE — Patient Instructions (Addendum)
[email or web address] ?Take Nurtec as needed for migraines. Take one pill at the onset of migraine. Can repeat a dose in 24 hours if headache persists. [email or web address] ?

## 2022-02-12 ENCOUNTER — Encounter: Payer: Self-pay | Admitting: *Deleted

## 2022-02-12 ENCOUNTER — Telehealth: Payer: Self-pay | Admitting: *Deleted

## 2022-02-12 NOTE — Telephone Encounter (Signed)
Nurtec PA, key [id number], G43.119, eligibility not found. Started new PA, eligibility still not found. Called plan, spoke with Isabelle Course, answered clinical questions. Approved thru 08/11/2022. They will fax approval in next 24 hours.  ?

## 2022-02-13 ENCOUNTER — Other Ambulatory Visit: Payer: Self-pay | Admitting: Psychiatry

## 2022-02-13 ENCOUNTER — Telehealth: Payer: Self-pay | Admitting: *Deleted

## 2022-02-13 NOTE — Telephone Encounter (Signed)
Answered clinical questions. Your information has been submitted to [employer].  If [employer] has not responded to your request within 24 hours, contact [employer] at [phone]. ?

## 2022-02-13 NOTE — Telephone Encounter (Signed)
Emgality PA, Key: [id number]. Your demographic data has been sent to [employer] successfully! ? ?[employer] typically takes 5-10 minutes to respond, but it may take a little longer in some cases.Please do not fax or call [employer] to resubmit this request. If you need assistance, please chat with CoverMyMeds or call us at [phone]. ?If it has been longer than 24 hours, please reach out to [employer]. ?

## 2022-02-17 ENCOUNTER — Encounter: Payer: Self-pay | Admitting: *Deleted

## 2022-02-17 NOTE — Telephone Encounter (Signed)
Per CMM: emgality deinied: Your PA request has been denied. Additional information will be provided in the denial communication. ?We have not received the denial documentation yet.  ?

## 2022-02-19 NOTE — Telephone Encounter (Signed)
Called patient, informed her I have emgality sample for her. She will come today to pick it up, stated she is going to call her insurance to see why they denied it since they approved in the past. Patient verbalized understanding, appreciation. ? ?

## 2022-02-19 NOTE — Telephone Encounter (Signed)
Patient came and picked up emgality sample.  ?

## 2022-02-23 DIAGNOSIS — Z888 Allergy status to other drugs, medicaments and biological substances status: Secondary | ICD-10-CM | POA: Diagnosis not present

## 2022-02-23 DIAGNOSIS — M545 Low back pain, unspecified: Secondary | ICD-10-CM | POA: Diagnosis not present

## 2022-02-23 DIAGNOSIS — Z886 Allergy status to analgesic agent status: Secondary | ICD-10-CM | POA: Diagnosis not present

## 2022-02-23 DIAGNOSIS — M4186 Other forms of scoliosis, lumbar region: Secondary | ICD-10-CM | POA: Diagnosis not present

## 2022-02-23 DIAGNOSIS — Z882 Allergy status to sulfonamides status: Secondary | ICD-10-CM | POA: Diagnosis not present

## 2022-02-23 DIAGNOSIS — M5136 Other intervertebral disc degeneration, lumbar region: Secondary | ICD-10-CM | POA: Diagnosis not present

## 2022-02-23 DIAGNOSIS — Z885 Allergy status to narcotic agent status: Secondary | ICD-10-CM | POA: Diagnosis not present

## 2022-02-23 DIAGNOSIS — Z87891 Personal history of nicotine dependence: Secondary | ICD-10-CM | POA: Diagnosis not present

## 2022-02-23 DIAGNOSIS — R159 Full incontinence of feces: Secondary | ICD-10-CM | POA: Diagnosis not present

## 2022-02-23 DIAGNOSIS — M47816 Spondylosis without myelopathy or radiculopathy, lumbar region: Secondary | ICD-10-CM | POA: Diagnosis not present

## 2022-02-23 DIAGNOSIS — Z91040 Latex allergy status: Secondary | ICD-10-CM | POA: Diagnosis not present

## 2022-02-23 DIAGNOSIS — M79605 Pain in left leg: Secondary | ICD-10-CM | POA: Diagnosis not present

## 2022-02-23 DIAGNOSIS — M4316 Spondylolisthesis, lumbar region: Secondary | ICD-10-CM | POA: Diagnosis not present

## 2022-02-23 DIAGNOSIS — Z91048 Other nonmedicinal substance allergy status: Secondary | ICD-10-CM | POA: Diagnosis not present

## 2022-02-24 DIAGNOSIS — G8929 Other chronic pain: Secondary | ICD-10-CM | POA: Diagnosis not present

## 2022-02-24 DIAGNOSIS — M5442 Lumbago with sciatica, left side: Secondary | ICD-10-CM | POA: Diagnosis not present

## 2022-02-24 DIAGNOSIS — M5441 Lumbago with sciatica, right side: Secondary | ICD-10-CM | POA: Diagnosis not present

## 2022-02-24 NOTE — Telephone Encounter (Signed)
Called [employer] [place], spoke with Ysidro Evert who stated I need to talk to [facility], [phone]. She transferred call, spoke with Celeste B who stated [employer] does not approve 2 CGRPs even though they are prescribed for two different reasons. The patient can write/type a letter giving her full Name, dob, insurance ID # . ?She will state " I want to be reconsidered for  dual CGRP therapy", signed ,dated. Fax letter with office notes to # below.  ?PA Appeals dept phone #[phone] opt 5,   ?Fax [phone]. Will send her my chart with instructions.  ?

## 2022-02-27 ENCOUNTER — Other Ambulatory Visit: Payer: Self-pay | Admitting: Psychiatry

## 2022-03-04 DIAGNOSIS — M79641 Pain in right hand: Secondary | ICD-10-CM | POA: Diagnosis not present

## 2022-03-04 DIAGNOSIS — M79642 Pain in left hand: Secondary | ICD-10-CM | POA: Diagnosis not present

## 2022-03-04 DIAGNOSIS — M180 Bilateral primary osteoarthritis of first carpometacarpal joints: Secondary | ICD-10-CM | POA: Diagnosis not present

## 2022-03-04 DIAGNOSIS — M65331 Trigger finger, right middle finger: Secondary | ICD-10-CM | POA: Diagnosis not present

## 2022-03-06 NOTE — Telephone Encounter (Signed)
Patient has sent a letter to request dual CGRP therapy. Letter and last 2 office notes faxed to [place] services for reconsideration of denial of Emgality.  ?

## 2022-03-06 NOTE — Telephone Encounter (Signed)
Received fax from [facility] asking for all past notes, scans, labs etc from past year. Office notes have been sent, the only new information was CT head scan. I refaxed office notes and CT head report.  ?

## 2022-03-10 NOTE — Telephone Encounter (Signed)
Called [employer] to check status of Emgality appeal, spoke with Raven V.  ?Appeal has been received on 03/08/22, currently pending, it was sent to peer review. Decision will be received via fax.  ?

## 2022-03-11 DIAGNOSIS — M5416 Radiculopathy, lumbar region: Secondary | ICD-10-CM | POA: Diagnosis not present

## 2022-03-11 DIAGNOSIS — M47816 Spondylosis without myelopathy or radiculopathy, lumbar region: Secondary | ICD-10-CM | POA: Diagnosis not present

## 2022-03-12 DIAGNOSIS — G4733 Obstructive sleep apnea (adult) (pediatric): Secondary | ICD-10-CM | POA: Diagnosis not present

## 2022-03-19 ENCOUNTER — Encounter: Payer: Self-pay | Admitting: *Deleted

## 2022-03-19 NOTE — Telephone Encounter (Signed)
Called [facility] [employer] pharmacy help desk to check on emgality appeal, spoke with Pleas Koch who stated it was approved on 03/17/22. Approved 02/15/22-03/17/2023.  Sent her my chart to advise. ?

## 2022-03-24 DIAGNOSIS — G4733 Obstructive sleep apnea (adult) (pediatric): Secondary | ICD-10-CM | POA: Diagnosis not present

## 2022-03-31 ENCOUNTER — Other Ambulatory Visit: Payer: Self-pay | Admitting: Psychiatry

## 2022-04-11 DIAGNOSIS — S6991XA Unspecified injury of right wrist, hand and finger(s), initial encounter: Secondary | ICD-10-CM | POA: Diagnosis not present

## 2022-04-14 DIAGNOSIS — M961 Postlaminectomy syndrome, not elsewhere classified: Secondary | ICD-10-CM | POA: Diagnosis not present

## 2022-04-14 DIAGNOSIS — Z9689 Presence of other specified functional implants: Secondary | ICD-10-CM | POA: Diagnosis not present

## 2022-04-17 DIAGNOSIS — G8929 Other chronic pain: Secondary | ICD-10-CM | POA: Diagnosis not present

## 2022-04-17 DIAGNOSIS — M5442 Lumbago with sciatica, left side: Secondary | ICD-10-CM | POA: Diagnosis not present

## 2022-04-23 DIAGNOSIS — M65331 Trigger finger, right middle finger: Secondary | ICD-10-CM | POA: Diagnosis not present

## 2022-04-24 DIAGNOSIS — G4733 Obstructive sleep apnea (adult) (pediatric): Secondary | ICD-10-CM | POA: Diagnosis not present

## 2022-04-25 DIAGNOSIS — R7303 Prediabetes: Secondary | ICD-10-CM | POA: Diagnosis not present

## 2022-04-25 DIAGNOSIS — J0110 Acute frontal sinusitis, unspecified: Secondary | ICD-10-CM | POA: Diagnosis not present

## 2022-04-25 DIAGNOSIS — J029 Acute pharyngitis, unspecified: Secondary | ICD-10-CM | POA: Diagnosis not present

## 2022-04-30 DIAGNOSIS — Z124 Encounter for screening for malignant neoplasm of cervix: Secondary | ICD-10-CM | POA: Diagnosis not present

## 2022-04-30 DIAGNOSIS — Z01419 Encounter for gynecological examination (general) (routine) without abnormal findings: Secondary | ICD-10-CM | POA: Diagnosis not present

## 2022-04-30 DIAGNOSIS — Z1151 Encounter for screening for human papillomavirus (HPV): Secondary | ICD-10-CM | POA: Diagnosis not present

## 2022-05-01 DIAGNOSIS — G8918 Other acute postprocedural pain: Secondary | ICD-10-CM | POA: Diagnosis not present

## 2022-05-01 DIAGNOSIS — M792 Neuralgia and neuritis, unspecified: Secondary | ICD-10-CM | POA: Diagnosis not present

## 2022-05-01 DIAGNOSIS — G8929 Other chronic pain: Secondary | ICD-10-CM | POA: Diagnosis not present

## 2022-05-01 DIAGNOSIS — M961 Postlaminectomy syndrome, not elsewhere classified: Secondary | ICD-10-CM | POA: Diagnosis not present

## 2022-05-02 DIAGNOSIS — M961 Postlaminectomy syndrome, not elsewhere classified: Secondary | ICD-10-CM | POA: Diagnosis not present

## 2022-05-02 DIAGNOSIS — G8929 Other chronic pain: Secondary | ICD-10-CM | POA: Diagnosis not present

## 2022-05-22 ENCOUNTER — Other Ambulatory Visit: Payer: Self-pay | Admitting: Family Medicine

## 2022-05-22 DIAGNOSIS — Z1231 Encounter for screening mammogram for malignant neoplasm of breast: Secondary | ICD-10-CM

## 2022-05-24 DIAGNOSIS — G4733 Obstructive sleep apnea (adult) (pediatric): Secondary | ICD-10-CM | POA: Diagnosis not present

## 2022-06-03 ENCOUNTER — Ambulatory Visit
Admission: RE | Admit: 2022-06-03 | Discharge: 2022-06-03 | Disposition: A | Discharge status: Home / Self Care | Payer: Federal, State, Local not specified - PPO | Source: Ambulatory Visit | Attending: Family Medicine | Admitting: Family Medicine

## 2022-06-03 DIAGNOSIS — Z1231 Encounter for screening mammogram for malignant neoplasm of breast: Secondary | ICD-10-CM | POA: Diagnosis not present

## 2022-06-05 ENCOUNTER — Other Ambulatory Visit: Payer: Self-pay | Admitting: Family Medicine

## 2022-06-05 DIAGNOSIS — R928 Other abnormal and inconclusive findings on diagnostic imaging of breast: Secondary | ICD-10-CM

## 2022-06-11 ENCOUNTER — Ambulatory Visit
Admission: RE | Admit: 2022-06-11 | Discharge: 2022-06-11 | Disposition: A | Discharge status: Home / Self Care | Payer: Federal, State, Local not specified - PPO | Source: Ambulatory Visit | Attending: Family Medicine | Admitting: Family Medicine

## 2022-06-11 ENCOUNTER — Other Ambulatory Visit: Payer: Self-pay | Admitting: Family Medicine

## 2022-06-11 DIAGNOSIS — R921 Mammographic calcification found on diagnostic imaging of breast: Secondary | ICD-10-CM

## 2022-06-11 DIAGNOSIS — R928 Other abnormal and inconclusive findings on diagnostic imaging of breast: Secondary | ICD-10-CM | POA: Diagnosis not present

## 2022-06-13 ENCOUNTER — Other Ambulatory Visit: Payer: Self-pay | Admitting: Psychiatry

## 2022-06-19 ENCOUNTER — Other Ambulatory Visit (HOSPITAL_COMMUNITY): Payer: Self-pay | Admitting: Diagnostic Radiology

## 2022-06-19 ENCOUNTER — Ambulatory Visit
Admission: RE | Admit: 2022-06-19 | Discharge: 2022-06-19 | Disposition: A | Discharge status: Home / Self Care | Payer: Federal, State, Local not specified - PPO | Source: Ambulatory Visit | Attending: Family Medicine | Admitting: Family Medicine

## 2022-06-19 DIAGNOSIS — R921 Mammographic calcification found on diagnostic imaging of breast: Secondary | ICD-10-CM

## 2022-06-19 DIAGNOSIS — M5442 Lumbago with sciatica, left side: Secondary | ICD-10-CM | POA: Diagnosis not present

## 2022-06-19 DIAGNOSIS — N6031 Fibrosclerosis of right breast: Secondary | ICD-10-CM | POA: Diagnosis not present

## 2022-06-19 DIAGNOSIS — G8929 Other chronic pain: Secondary | ICD-10-CM | POA: Diagnosis not present

## 2022-06-21 HISTORY — PX: BREAST BIOPSY: SHX20

## 2022-06-24 ENCOUNTER — Encounter: Payer: Self-pay | Admitting: Psychiatry

## 2022-06-24 DIAGNOSIS — I10 Essential (primary) hypertension: Secondary | ICD-10-CM | POA: Diagnosis not present

## 2022-06-24 DIAGNOSIS — E785 Hyperlipidemia, unspecified: Secondary | ICD-10-CM | POA: Diagnosis not present

## 2022-06-24 DIAGNOSIS — R7303 Prediabetes: Secondary | ICD-10-CM | POA: Diagnosis not present

## 2022-06-24 DIAGNOSIS — E669 Obesity, unspecified: Secondary | ICD-10-CM | POA: Diagnosis not present

## 2022-06-24 DIAGNOSIS — D649 Anemia, unspecified: Secondary | ICD-10-CM | POA: Diagnosis not present

## 2022-06-25 ENCOUNTER — Other Ambulatory Visit: Payer: Self-pay | Admitting: Psychiatry

## 2022-06-30 DIAGNOSIS — M180 Bilateral primary osteoarthritis of first carpometacarpal joints: Secondary | ICD-10-CM | POA: Diagnosis not present

## 2022-06-30 DIAGNOSIS — G8929 Other chronic pain: Secondary | ICD-10-CM | POA: Diagnosis not present

## 2022-06-30 DIAGNOSIS — M79644 Pain in right finger(s): Secondary | ICD-10-CM | POA: Diagnosis not present

## 2022-06-30 DIAGNOSIS — M1812 Unilateral primary osteoarthritis of first carpometacarpal joint, left hand: Secondary | ICD-10-CM | POA: Diagnosis not present

## 2022-07-10 DIAGNOSIS — M419 Scoliosis, unspecified: Secondary | ICD-10-CM | POA: Diagnosis not present

## 2022-07-10 DIAGNOSIS — M7138 Other bursal cyst, other site: Secondary | ICD-10-CM | POA: Diagnosis not present

## 2022-07-10 DIAGNOSIS — Z981 Arthrodesis status: Secondary | ICD-10-CM | POA: Diagnosis not present

## 2022-07-10 DIAGNOSIS — Z4789 Encounter for other orthopedic aftercare: Secondary | ICD-10-CM | POA: Diagnosis not present

## 2022-07-15 DIAGNOSIS — M5416 Radiculopathy, lumbar region: Secondary | ICD-10-CM | POA: Diagnosis not present

## 2022-07-16 ENCOUNTER — Other Ambulatory Visit: Payer: Self-pay | Admitting: Psychiatry

## 2022-07-17 ENCOUNTER — Other Ambulatory Visit: Payer: Self-pay

## 2022-07-17 MED ORDER — GABAPENTIN 100 MG PO CAPS: ORAL_CAPSULE | ORAL | 2 refills | Status: DC

## 2022-07-24 DIAGNOSIS — F411 Generalized anxiety disorder: Secondary | ICD-10-CM | POA: Diagnosis not present

## 2022-07-24 DIAGNOSIS — F339 Major depressive disorder, recurrent, unspecified: Secondary | ICD-10-CM | POA: Diagnosis not present

## 2022-07-29 DIAGNOSIS — Z23 Encounter for immunization: Secondary | ICD-10-CM | POA: Diagnosis not present

## 2022-08-11 DIAGNOSIS — Z20822 Contact with and (suspected) exposure to covid-19: Secondary | ICD-10-CM | POA: Diagnosis not present

## 2022-08-11 DIAGNOSIS — F339 Major depressive disorder, recurrent, unspecified: Secondary | ICD-10-CM | POA: Diagnosis not present

## 2022-08-11 DIAGNOSIS — F411 Generalized anxiety disorder: Secondary | ICD-10-CM | POA: Diagnosis not present

## 2022-08-11 DIAGNOSIS — J029 Acute pharyngitis, unspecified: Secondary | ICD-10-CM | POA: Diagnosis not present

## 2022-08-11 DIAGNOSIS — R051 Acute cough: Secondary | ICD-10-CM | POA: Diagnosis not present

## 2022-08-25 ENCOUNTER — Telehealth: Payer: Self-pay | Admitting: *Deleted

## 2022-08-25 NOTE — Therapy (Signed)
OUTPATIENT PHYSICAL THERAPY THORACOLUMBAR EVALUATION   Patient Name: Ashley [name] MRN: [id number] DOB:29-Sep-1956, 66 y.o., female Today's Date: 08/26/2022   PT End of Session - 08/26/22 1035     Visit Number 1    Number of Visits 18    Date for PT Re-Evaluation 11/25/21    Authorization Type BCBS/FEDERAL EMP PPO    PT Start Time 1015    PT Stop Time 1100    PT Time Calculation (min) 45 min    Activity Tolerance Patient tolerated treatment well    Behavior During Therapy [facility] for tasks assessed/performed             Past Medical History:  Diagnosis Date   Anemia    Anxiety 2020   Arthritis    Asthma 2013-2014   Depression    Dyspnea 2013   Fibromyalgia 09/04/2014   GERD (gastroesophageal reflux disease)    Headache    History of hiatal hernia    Hypertension    Lumbar radicular pain 09/04/2014   Pre-diabetes    Sacroiliac joint pain    nonunion of left SI joint   Sleep apnea 2016   Spinal headache    trouble waking    TIA (transient ischemic attack)    Wears glasses    Past Surgical History:  Procedure Laterality Date   ABDOMINAL HYSTERECTOMY     BACK SURGERY  2002   BREAST LUMPECTOMY Left 1985   BREAST SURGERY     BUNIONECTOMY     COLONOSCOPY     DILATION AND CURETTAGE OF UTERUS     ESOPHAGEAL MANOMETRY [name] 11/21/2013   Procedure: ESOPHAGEAL MANOMETRY (EM);  Surgeon: Shirley Friar, MD;  Location: [facility];  Service: Endoscopy;  Laterality: [name];   KNEE ARTHROSCOPY W/ MENISCAL REPAIR     LUMBAR LAMINECTOMY     MULTIPLE TOOTH EXTRACTIONS     NASAL SINUS SURGERY     PAROTIDECTOMY Left 09/21/2020   Procedure: LEFT SUPERFICIAL PAROTIDECTOMY WITH NERVE MONITORING;  Surgeon: Osborn Coho, MD;  [facility];  Service: ENT;  Laterality: Left;   SACROILIAC JOINT FUSION Left 08/05/2018   Procedure: REVISION LEFT SACROILIAC JOINT FUSION WITH INSTRUMENTATION AND ALLOGRAFT;  Surgeon: Estill Bamberg, MD;  Location: [facility] OR;  Service:  Orthopedics;  Laterality: Left;   TONSILLECTOMY     Patient Active Problem List   Diagnosis Date Noted   Mass of left parotid gland 09/21/2020   Parotid mass 09/21/2020   De Quervain's tenosynovitis 06/22/2019   Trigger middle finger of right hand 04/27/2019   Stiffness of finger joint, right 04/27/2019   Xerostomia 02/23/2019   Dry eyes 02/23/2019   Family history of diabetes mellitus 01/26/2019   Family history of ischemic heart disease (IHD) 01/26/2019   Family history of stroke 01/26/2019   Hiatal hernia 01/26/2019   Knee pain, left 01/26/2019   Sacroiliac joint dysfunction 08/05/2018   Spondylosis of lumbar region without myelopathy or radiculopathy 05/16/2016   Generalized headaches 04/21/2016   History of migraine 04/21/2016   Menopause 04/09/2016   Benign hypertension 04/09/2016   Depressive disorder 04/09/2016   Disorder of thyroid gland 04/09/2016   Dysphagia 04/09/2016   Chronic low back pain with left-sided sciatica 03/24/2016   Asthmatic bronchitis 09/06/2014   GERD (gastroesophageal reflux disease) 09/06/2014   Allergic rhinitis 09/06/2014   Lumbar radicular pain 09/04/2014   Fibromyalgia 09/04/2014   Back pain 06/01/2013   Cubital tunnel syndrome 06/01/2013   Muscle spasms of neck 06/01/2013  Myofascial pain 06/01/2013   Neuropathic pain 06/01/2013   Right wrist pain 06/01/2013   Shoulder pain 06/01/2013   Primary localized osteoarthrosis, lower leg 12/08/2012   Encounter for routine gynecological examination 11/16/2012   Borderline hypertension 06/16/2012   Schatzki's ring 05/21/2012   G6PD deficiency 04/14/2012   Lumbar post-laminectomy syndrome 08/26/2011   SOB (shortness of breath) 04/27/2009    PCP: None  REFERRING PROVIDER: Aura Dials, MD   REFERRING DIAG: Lumbago with sciatica, left side [M54.42], Other chronic pain [G89.29]  Rationale for Evaluation and Treatment Rehabilitation  THERAPY DIAG:  Other low back pain  Chronic  left-sided low back pain with left-sided sciatica  Muscle weakness (generalized)  Abnormal posture  Other abnormalities of gait and mobility  ONSET DATE: February 2016.  SUBJECTIVE:                                                                                                                                                                                           SUBJECTIVE STATEMENT: Ashley [name] presents to PT with chronic L lower back pain with sciaticia. It started in Feb 2016 when she fell on her butt and tore her SI joint. She got a fusion a few months later, which was repeated again a few years later. 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 She reports constant pain in her L lower back that radiates into her L LE. She got a spinal stimulator placed in June 2023 which has helped with her lower back pain, but not the leg pain. The only thing that helps reduce leg pain is opioids which she doesn't like to take frequently. She reports wearing a heel lift in her L LE due to scoliosis.   PERTINENT HISTORY:  Anxiety, Depression, Fibromyalgia, HTN, Pre Diabetic, TIA, Scoliosis.   PAIN:  Are you having pain? Yes: NPRS scale: 7.5-8/10 Pain location: Entire L LE. Pain description: Sharp pain, numbness and tingling, burning. Aggravating factors: Bending over, reaching OH, walking, standing, sitting.  Relieving factors: Opioids, rest, ice, heat, stimulator.   PRECAUTIONS: None  WEIGHT BEARING RESTRICTIONS: No  FALLS:  Has patient fallen in last 6 months? Yes. Number of falls 2, when stepping backwards onto a step, and when cleaning behind bedroom door and trying to move something out of the way. Both falls in posterior direction.   LIVING ENVIRONMENT: Lives with:  Lives with her mother who has dementia Lives in: House/apartment Stairs: Yes: External: 3 steps; none and 8 in back with railings.  Has following equipment at home:  Electric scooter if long  distance, walking stick.   OCCUPATION:  Disability due to scoring 30% on functional capacity evaluation.   PLOF: Independent  PATIENT GOALS: Pt would like to reduce her pain and be more functional.    OBJECTIVE:   DIAGNOSTIC FINDINGS:  None.   PATIENT SURVEYS:  FOTO 40.76%, 45% in 14 vitis.   SCREENING FOR RED FLAGS: Bowel or bladder incontinence: No  COGNITION: Overall cognitive status: Within functional limits for tasks assessed     SENSATION: WFL   POSTURE: rounded shoulders, forward head, and increased lumbar lordosis  PALPATION: Entire L side of QL, Paraspinals and into glutes  LUMBAR ROM:   AROM eval  Flexion WFL, increases symptoms  Extension WFL  Right lateral flexion WFL  Left lateral flexion WFL - increases symptoms.   Right rotation WFL  Left rotation WFL   (Blank rows = not tested)  LOWER EXTREMITY ROM:     Active  Right eval Left eval  Hip flexion [facility] WFL, increases symptoms  Hip extension    Hip abduction    Knee flexion [facility] WFL  Knee extension [facility] WFL, increases symptoms   (Blank rows = not tested)  LOWER EXTREMITY MMT:    MMT Right eval Left eval  Hip flexion 5+ 4 limited by pain  Hip extension    Hip abduction    Knee flexion 5 4 limited by pain  Knee extension 5 4 limited by pain   (Blank rows = not tested)  LUMBAR SPECIAL TESTS:  Slump test: Positive  FUNCTIONAL TESTS:  5 times sit to stand: 19.97 seconds.  Timed up and go (TUG): 12.01 sec  GAIT: Distance walked: 50 ft Assistive device utilized: None Level of assistance: Complete Independence Comments: Pt walks with decreased step length and decreased weight on L LE.   TODAY'S TREATMENT:                                                                                                                              DATE: 08/26/2022 Creating, reviewing, and completing below HEP    PATIENT EDUCATION:  Education details: Educated pt on anatomy and physiology of current  symptoms, FOTO, diagnosis, prognosis, HEP,  and POC. Person educated: Patient Education method: Customer service manager Education comprehension: verbalized understanding and returned demonstration  HOME EXERCISE PROGRAM: Pt  is already performing LTR and Piriformis stretch after reviewing. She denied a handout.    ASSESSMENT:  CLINICAL IMPRESSION: Patient referred to PT for L sided lower back pain and sciatica. She demonstrates full functional ROM in both lumbar and biat LE's with increased pain noted on L >R. She is limited with strength secondary to onset of pain. Pt with tenderness along entire L lower lumbar, and glute region. Patient will benefit from skilled PT to address below impairments, limitations and improve overall function.  OBJECTIVE IMPAIRMENTS: decreased activity tolerance, difficulty walking, decreased balance, decreased endurance, decreased mobility, decreased ROM, decreased strength, impaired flexibility, impaired UE/LE use, postural dysfunction, and pain.  ACTIVITY LIMITATIONS: bending,  lifting, carry, locomotion, cleaning, community activity, driving, and or occupation  PERSONAL FACTORS: Anxiety, Depression, Fibromyalgia, HTN, Pre Diabetic, TIA are also affecting patient's functional outcome.  REHAB POTENTIAL: Good  CLINICAL DECISION MAKING: Stable/uncomplicated  EVALUATION COMPLEXITY: Low    GOALS: Short term PT Goals Target date: 09/23/2022 Pt will be I and compliant with HEP. Baseline:  Goal status: New Pt will decrease pain by 25% overall Baseline: Goal status: New  Long term PT goals Target date: 10/21/2022 Pt will improve ROM to [facility] to improve functional mobility Baseline: Goal status: New Pt will improve  hip/knee strength to at least 5-/5 MMT to improve functional strength Baseline: Goal status: New Pt will improve FOTO to at least 45% functional to show improved function Baseline: Goal status: New Pt will reduce pain by overall 50%  overall with usual activity Baseline: Goal status: New Pt will reduce pain to overall less than 2-3/10 with usual activity and work activity. Baseline: Goal status: New Pt will be able to ambulate community distances at least 1000 ft WNL gait pattern without complaints Baseline: Goal status: New      7. Pt will improve her STS by 2.3 seconds for Minimal clinically important difference.         Baseline:         Goal status: New       8. Pt will improve TUG by 3.4 seconds or Minimal clinically important difference.        Baseline:        Goal status: New  PLAN: PT FREQUENCY: 1-3 times per week   PT DURATION: 6-8 weeks  PLANNED INTERVENTIONS (unless contraindicated): aquatic PT, Canalith repositioning, cryotherapy, Electrical stimulation, Iontophoresis with 4 mg/ml dexamethasome, Moist heat, traction, Ultrasound, gait training, Therapeutic exercise, balance training, neuromuscular re-education, patient/family education, prosthetic training, manual techniques, passive ROM, dry needling, taping, vasopnuematic device, vestibular, spinal manipulations, joint manipulations  PLAN FOR NEXT SESSION: Assess HEP/update PRN, continue to progress functional mobility, strengthen L LE, TrA, and glutes muscles. Decrease patients pain and help minimize functional deficits.      Lynden Ang, PT 08/26/2022, 12:16 PM

## 2022-08-25 NOTE — Telephone Encounter (Signed)
[employer] PA< Key: [id number]. Eligibility not found.  Added "Jones" to her name and PA went through.  Your information has been submitted to [place].  If [employer] has not responded to your request within 24 hours, contact [place] at [phone].

## 2022-08-26 ENCOUNTER — Other Ambulatory Visit: Payer: Self-pay

## 2022-08-26 ENCOUNTER — Encounter: Payer: Self-pay | Admitting: *Deleted

## 2022-08-26 ENCOUNTER — Ambulatory Visit (HOSPITAL_BASED_OUTPATIENT_CLINIC_OR_DEPARTMENT_OTHER): Payer: Federal, State, Local not specified - PPO | Attending: Neurosurgery | Admitting: Physical Therapy

## 2022-08-26 ENCOUNTER — Encounter (HOSPITAL_BASED_OUTPATIENT_CLINIC_OR_DEPARTMENT_OTHER): Payer: Self-pay | Admitting: Physical Therapy

## 2022-08-26 DIAGNOSIS — R262 Difficulty in walking, not elsewhere classified: Secondary | ICD-10-CM | POA: Insufficient documentation

## 2022-08-26 DIAGNOSIS — M5442 Lumbago with sciatica, left side: Secondary | ICD-10-CM | POA: Insufficient documentation

## 2022-08-26 DIAGNOSIS — R2689 Other abnormalities of gait and mobility: Secondary | ICD-10-CM | POA: Insufficient documentation

## 2022-08-26 DIAGNOSIS — G8929 Other chronic pain: Secondary | ICD-10-CM | POA: Insufficient documentation

## 2022-08-26 DIAGNOSIS — R293 Abnormal posture: Secondary | ICD-10-CM

## 2022-08-26 DIAGNOSIS — M5459 Other low back pain: Secondary | ICD-10-CM

## 2022-08-26 DIAGNOSIS — M6281 Muscle weakness (generalized): Secondary | ICD-10-CM

## 2022-08-26 NOTE — Telephone Encounter (Signed)
[employer] The authorization is valid from 07/26/2022 through 08/25/2023.

## 2022-08-29 ENCOUNTER — Encounter (HOSPITAL_BASED_OUTPATIENT_CLINIC_OR_DEPARTMENT_OTHER): Payer: Self-pay | Admitting: Physical Therapy

## 2022-08-29 ENCOUNTER — Ambulatory Visit (HOSPITAL_BASED_OUTPATIENT_CLINIC_OR_DEPARTMENT_OTHER): Payer: Federal, State, Local not specified - PPO | Admitting: Physical Therapy

## 2022-08-29 DIAGNOSIS — G8929 Other chronic pain: Secondary | ICD-10-CM

## 2022-08-29 DIAGNOSIS — M5459 Other low back pain: Secondary | ICD-10-CM

## 2022-08-29 DIAGNOSIS — M5442 Lumbago with sciatica, left side: Secondary | ICD-10-CM | POA: Diagnosis not present

## 2022-08-29 DIAGNOSIS — R293 Abnormal posture: Secondary | ICD-10-CM

## 2022-08-29 DIAGNOSIS — R262 Difficulty in walking, not elsewhere classified: Secondary | ICD-10-CM | POA: Diagnosis not present

## 2022-08-29 DIAGNOSIS — R2689 Other abnormalities of gait and mobility: Secondary | ICD-10-CM | POA: Diagnosis not present

## 2022-08-29 DIAGNOSIS — M6281 Muscle weakness (generalized): Secondary | ICD-10-CM

## 2022-08-29 NOTE — Therapy (Signed)
OUTPATIENT PHYSICAL THERAPY THORACOLUMBAR TREATMENT   Patient Name: Ashley [name] MRN: [id number] DOB:1955/12/20, 66 y.o., female Today's Date: 08/29/2022   PT End of Session - 08/29/22 1418     Visit Number 2    Number of Visits 18    Date for PT Re-Evaluation 10/28/22    Authorization Type BCBS/FEDERAL EMP PPO    PT Start Time 1420    PT Stop Time 1500    PT Time Calculation (min) 40 min    Activity Tolerance Patient tolerated treatment well    Behavior During Therapy [facility] for tasks assessed/performed             Past Medical History:  Diagnosis Date   Anemia    Anxiety 2020   Arthritis    Asthma 2013-2014   Depression    Dyspnea 2013   Fibromyalgia 09/04/2014   GERD (gastroesophageal reflux disease)    Headache    History of hiatal hernia    Hypertension    Lumbar radicular pain 09/04/2014   Pre-diabetes    Sacroiliac joint pain    nonunion of left SI joint   Sleep apnea 2016   Spinal headache    trouble waking    TIA (transient ischemic attack)    Wears glasses    Past Surgical History:  Procedure Laterality Date   ABDOMINAL HYSTERECTOMY     BACK SURGERY  2002   BREAST LUMPECTOMY Left 1985   BREAST SURGERY     BUNIONECTOMY     COLONOSCOPY     DILATION AND CURETTAGE OF UTERUS     ESOPHAGEAL MANOMETRY [name] 11/21/2013   Procedure: ESOPHAGEAL MANOMETRY (EM);  Surgeon: Shirley Friar, MD;  Location: [facility];  Service: Endoscopy;  Laterality: [name];   KNEE ARTHROSCOPY W/ MENISCAL REPAIR     LUMBAR LAMINECTOMY     MULTIPLE TOOTH EXTRACTIONS     NASAL SINUS SURGERY     PAROTIDECTOMY Left 09/21/2020   Procedure: LEFT SUPERFICIAL PAROTIDECTOMY WITH NERVE MONITORING;  Surgeon: Osborn Coho, MD;  Location: [facility] OR;  Service: ENT;  Laterality: Left;   SACROILIAC JOINT FUSION Left 08/05/2018   Procedure: REVISION LEFT SACROILIAC JOINT FUSION WITH INSTRUMENTATION AND ALLOGRAFT;  Surgeon: Estill Bamberg, MD;  Location: [facility] OR;  Service:  Orthopedics;  Laterality: Left;   TONSILLECTOMY     Patient Active Problem List   Diagnosis Date Noted   Mass of left parotid gland 09/21/2020   Parotid mass 09/21/2020   De Quervain's tenosynovitis 06/22/2019   Trigger middle finger of right hand 04/27/2019   Stiffness of finger joint, right 04/27/2019   Xerostomia 02/23/2019   Dry eyes 02/23/2019   Family history of diabetes mellitus 01/26/2019   Family history of ischemic heart disease (IHD) 01/26/2019   Family history of stroke 01/26/2019   Hiatal hernia 01/26/2019   Knee pain, left 01/26/2019   Sacroiliac joint dysfunction 08/05/2018   Spondylosis of lumbar region without myelopathy or radiculopathy 05/16/2016   Generalized headaches 04/21/2016   History of migraine 04/21/2016   Menopause 04/09/2016   Benign hypertension 04/09/2016   Depressive disorder 04/09/2016   Disorder of thyroid gland 04/09/2016   Dysphagia 04/09/2016   Chronic low back pain with left-sided sciatica 03/24/2016   Asthmatic bronchitis 09/06/2014   GERD (gastroesophageal reflux disease) 09/06/2014   Allergic rhinitis 09/06/2014   Lumbar radicular pain 09/04/2014   Fibromyalgia 09/04/2014   Back pain 06/01/2013   Cubital tunnel syndrome 06/01/2013   Muscle spasms of neck 06/01/2013  Myofascial pain 06/01/2013   Neuropathic pain 06/01/2013   Right wrist pain 06/01/2013   Shoulder pain 06/01/2013   Primary localized osteoarthrosis, lower leg 12/08/2012   Encounter for routine gynecological examination 11/16/2012   Borderline hypertension 06/16/2012   Schatzki's ring 05/21/2012   G6PD deficiency 04/14/2012   Lumbar post-laminectomy syndrome 08/26/2011   SOB (shortness of breath) 04/27/2009    PCP: None  REFERRING PROVIDER: Henrine Screws, MD   REFERRING DIAG: Lumbago with sciatica, left side [M54.42], Other chronic pain [G89.29]  Rationale for Evaluation and Treatment Rehabilitation  THERAPY DIAG:  Other low back pain  Chronic  left-sided low back pain with left-sided sciatica  Muscle weakness (generalized)  Abnormal posture  ONSET DATE: February 2016.  SUBJECTIVE:                                                                                                                                                                                           SUBJECTIVE STATEMENT: Pt reports no new changes since eval.   She has 2 bushes she is going to have to plant within a week and she's wondering how to do it.     From Eval:  Laury presents to PT with chronic L lower back pain with sciaticia. It started in Feb 2016 when she fell on her butt and tore her SI joint. She got a fusion a few months later, which was repeated again a few years later. 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 She reports constant pain in her L lower back that radiates into her L LE. She got a spinal stimulator placed in June 2023 which has helped with her lower back pain, but not the leg pain. The only thing that helps reduce leg pain is opioids which she doesn't like to take frequently. She reports wearing a heel lift in her L LE due to scoliosis.   PERTINENT HISTORY:  Anxiety, Depression, Fibromyalgia, HTN, Pre Diabetic, TIA, Scoliosis.   PAIN:  Are you having pain? Yes: NPRS scale: 7/10 Pain location: back and into Entire L LE into last 3 toes  Pain description: Sharp pain, burning. Aggravating factors: Bending over, reaching OH, walking, standing, sitting.  Relieving factors: Opioids, rest, ice, heat, stimulator.   PRECAUTIONS: None  WEIGHT BEARING RESTRICTIONS: No  FALLS:  Has patient fallen in last 6 months? Yes. Number of falls 2, when stepping backwards onto a step, and when cleaning behind bedroom door and trying to move something out of the way. Both falls in posterior direction.   LIVING ENVIRONMENT: Lives with:  Lives with her mother who  has dementia Lives in: House/apartment Stairs: Yes:  External: 3 steps; none and 8 in back with railings.  Has following equipment at home:  Electric scooter if long distance, walking stick.   OCCUPATION: Disability due to scoring 30% on functional capacity evaluation.   PLOF: Independent  PATIENT GOALS: Pt would like to reduce her pain and be more functional.    OBJECTIVE:   DIAGNOSTIC FINDINGS:  None.   PATIENT SURVEYS:  FOTO 40.76%, 45% in 14 vitis.   SCREENING FOR RED FLAGS: Bowel or bladder incontinence: No  COGNITION: Overall cognitive status: Within functional limits for tasks assessed     SENSATION: WFL   POSTURE: rounded shoulders, forward head, and increased lumbar lordosis  PALPATION: Entire L side of QL, Paraspinals and into glutes  LUMBAR ROM:   AROM eval  Flexion WFL, increases symptoms  Extension WFL  Right lateral flexion WFL  Left lateral flexion WFL - increases symptoms.   Right rotation WFL  Left rotation WFL   (Blank rows = not tested)  LOWER EXTREMITY ROM:     Active  Right eval Left eval  Hip flexion [facility] WFL, increases symptoms  Hip extension    Hip abduction    Knee flexion [facility] WFL  Knee extension [facility] WFL, increases symptoms   (Blank rows = not tested)  LOWER EXTREMITY MMT:    MMT Right eval Left eval  Hip flexion 5+ 4 limited by pain  Hip extension    Hip abduction    Knee flexion 5 4 limited by pain  Knee extension 5 4 limited by pain   (Blank rows = not tested)  LUMBAR SPECIAL TESTS:  Slump test: Positive  FUNCTIONAL TESTS:  5 times sit to stand: 19.97 seconds.  Timed up and go (TUG): 12.01 sec  GAIT: Distance walked: 50 ft Assistive device utilized: None Level of assistance: Complete Independence Comments: Pt walks with decreased step length and decreased weight on L LE.   TODAY'S TREATMENT:                                                                                                                              DATE: 08/29/2022 Pt seen for aquatic therapy  today.  Treatment took place in water 3.25-4.75 ft in depth at the [facility]. Temp of water was 91.  Pt entered/exited the pool via stairs independently with bilat rail.  * intro to [employer] (properties/ expectations, etc) * in 4+ ft of water: unsupported walking forward/ backward / side stepping / high knee marching * holding wall:  heel raises, hip abdct/add x 5 x 3; hip ext x 10 each; hip circles CW/CCW;  split squats x 10 x 3 * walking forward kicks;  side squat R/L - repeated with rainbow hand floats * straddling yellow noodle:  cycling with doggy paddle arms, jumping jack legs and cc ski with feet gently touching ground * back against wall: using thin square noodle as  strap- ITB, hamstring and adductor stretch each LE  Pt requires the buoyancy and hydrostatic pressure of water for support, and to offload joints by unweighting joint load by at least 50 % in navel deep water and by at least 75-80% in chest to neck deep water.  Viscosity of the water is needed for resistance of strengthening. Water current perturbations provides challenge to standing balance requiring increased core activation.     PATIENT EDUCATION:  Education details: Educated pt on anatomy and physiology of current symptoms, FOTO, diagnosis, prognosis, HEP,  and POC. Person educated: Patient Education method: Medical illustratorxplanation and Demonstration Education comprehension: verbalized understanding and returned demonstration  HOME EXERCISE PROGRAM: Pt  is already performing LTR and Piriformis stretch after reviewing. She denied a handout.    ASSESSMENT:  CLINICAL IMPRESSION: Pt is confident in aquatic setting and is able to take direction from therapist on deck.  She reported gradual reduction of radicular symptoms during session, centralizing to Lt hip.  She reported reduction of pain to 5/10 by end of session.  She tolerated all exercises well.  Goals are ongoing.    OBJECTIVE IMPAIRMENTS: decreased  activity tolerance, difficulty walking, decreased balance, decreased endurance, decreased mobility, decreased ROM, decreased strength, impaired flexibility, impaired UE/LE use, postural dysfunction, and pain.  ACTIVITY LIMITATIONS: bending, lifting, carry, locomotion, cleaning, community activity, driving, and or occupation  PERSONAL FACTORS: Anxiety, Depression, Fibromyalgia, HTN, Pre Diabetic, TIA are also affecting patient's functional outcome.  REHAB POTENTIAL: Good  CLINICAL DECISION MAKING: Stable/uncomplicated  EVALUATION COMPLEXITY: Low    GOALS: Short term PT Goals Target date: 09/23/22 Pt will be I and compliant with HEP. Baseline:  Goal status: New Pt will decrease pain by 25% overall Baseline: Goal status: New  Long term PT goals Target date: 10/21/22 Pt will [place] to improve functional mobility Baseline: Goal status: New Pt will improve  hip/knee strength to at least 5-/5 MMT to improve functional strength Baseline: Goal status: New Pt will improve FOTO to at least 45% functional to show improved function Baseline: Goal status: New Pt will reduce pain by overall 50% overall with usual activity Baseline: Goal status: New Pt will reduce pain to overall less than 2-3/10 with usual activity and work activity. Baseline: Goal status: New Pt will be able to ambulate community distances at least 1000 ft WNL gait pattern without complaints Baseline: Goal status: New      7. Pt will improve her STS by 2.3 seconds for Minimal clinically important difference.         Baseline:         Goal status: New       8. Pt will improve TUG by 3.4 seconds or Minimal clinically important difference.        Baseline:        Goal status: New  PLAN: PT FREQUENCY: 1-3 times per week   PT DURATION: 6-8 weeks  PLANNED INTERVENTIONS (unless contraindicated): aquatic PT, Canalith repositioning, cryotherapy, Electrical stimulation, Iontophoresis with 4 mg/ml  dexamethasome, Moist heat, traction, Ultrasound, gait training, Therapeutic exercise, balance training, neuromuscular re-education, patient/family education, prosthetic training, manual techniques, passive ROM, dry needling, taping, vasopnuematic device, vestibular, spinal manipulations, joint manipulations  PLAN FOR NEXT SESSION: Assess HEP/update PRN, continue to progress functional mobility, strengthen L LE, TrA, and glutes muscles. Decrease patients pain and help minimize functional deficits.   Mayer CamelJennifer Carlson-Long, PTA 08/29/22 3:23 PM [facility] [address] [place], [place], [id number] Phone: [phone]  Fax:  [phone]

## 2022-09-02 ENCOUNTER — Ambulatory Visit (HOSPITAL_BASED_OUTPATIENT_CLINIC_OR_DEPARTMENT_OTHER): Payer: Federal, State, Local not specified - PPO | Admitting: Physical Therapy

## 2022-09-02 ENCOUNTER — Encounter (HOSPITAL_BASED_OUTPATIENT_CLINIC_OR_DEPARTMENT_OTHER): Payer: Self-pay | Admitting: Physical Therapy

## 2022-09-02 DIAGNOSIS — M6281 Muscle weakness (generalized): Secondary | ICD-10-CM

## 2022-09-02 DIAGNOSIS — M5459 Other low back pain: Secondary | ICD-10-CM

## 2022-09-02 DIAGNOSIS — G8929 Other chronic pain: Secondary | ICD-10-CM | POA: Diagnosis not present

## 2022-09-02 DIAGNOSIS — R262 Difficulty in walking, not elsewhere classified: Secondary | ICD-10-CM | POA: Diagnosis not present

## 2022-09-02 DIAGNOSIS — M5442 Lumbago with sciatica, left side: Secondary | ICD-10-CM | POA: Diagnosis not present

## 2022-09-02 DIAGNOSIS — R2689 Other abnormalities of gait and mobility: Secondary | ICD-10-CM | POA: Diagnosis not present

## 2022-09-02 DIAGNOSIS — R293 Abnormal posture: Secondary | ICD-10-CM

## 2022-09-02 NOTE — Therapy (Signed)
OUTPATIENT PHYSICAL THERAPY THORACOLUMBAR TREATMENT   Patient Name: Ashley [name] MRN: [id number] DOB:1956/06/03, 66 y.o., female Today's Date: 09/02/2022   PT End of Session - 09/02/22 1536     Visit Number 3    Number of Visits 18    Date for PT Re-Evaluation 10/28/22    Authorization Type [employer] P[employer]    PT Start Time 1533    PT Stop Time 1615    PT Time Calculation (min) 42 min    Behavior During Therapy [facility] for tasks assessed/performed             Past Medical History:  Diagnosis Date   Anemia    Anxiety 2020   Arthritis    Asthma 2013-2014   Depression    Dyspnea 2013   Fibromyalgia 09/04/2014   GERD (gastroesophageal reflux disease)    Headache    History of hiatal hernia    Hypertension    Lumbar radicular pain 09/04/2014   Pre-diabetes    Sacroiliac joint pain    nonunion of left SI joint   Sleep apnea 2016   Spinal headache    trouble waking    TIA (transient ischemic attack)    Wears glasses    Past Surgical History:  Procedure Laterality Date   ABDOMINAL HYSTERECTOMY     BACK SURGERY  2002   BREAST LUMPECTOMY Left 1985   BREAST SURGERY     BUNIONECTOMY     COLONOSCOPY     DILATION AND CURETTAGE OF UTERUS     ESOPHAGEAL MANOMETRY [name] 11/21/2013   Procedure: ESOPHAGEAL MANOMETRY (EM);  Surgeon: Lear Ng, MD;  Location: [facility];  Service: Endoscopy;  Laterality: [name];   KNEE ARTHROSCOPY W/ MENISCAL REPAIR     LUMBAR LAMINECTOMY     MULTIPLE TOOTH EXTRACTIONS     NASAL SINUS SURGERY     PAROTIDECTOMY Left 09/21/2020   Procedure: LEFT SUPERFICIAL PAROTIDECTOMY WITH NERVE MONITORING;  Surgeon: Jerrell Belfast, MD;  Location: [place];  Service: ENT;  Laterality: Left;   SACROILIAC JOINT FUSION Left 08/05/2018   Procedure: REVISION LEFT SACROILIAC JOINT FUSION WITH INSTRUMENTATION AND ALLOGRAFT;  Surgeon: Phylliss Bob, MD;  Location: [place];  Service: Orthopedics;  Laterality: Left;   TONSILLECTOMY     Patient  Active Problem List   Diagnosis Date Noted   Mass of left parotid gland 09/21/2020   Parotid mass 09/21/2020   De Quervain's tenosynovitis 06/22/2019   Trigger middle finger of right hand 04/27/2019   Stiffness of finger joint, right 04/27/2019   Xerostomia 02/23/2019   Dry eyes 02/23/2019   Family history of diabetes mellitus 01/26/2019   Family history of ischemic heart disease (IHD) 01/26/2019   Family history of stroke 01/26/2019   Hiatal hernia 01/26/2019   Knee pain, left 01/26/2019   Sacroiliac joint dysfunction 08/05/2018   Spondylosis of lumbar region without myelopathy or radiculopathy 05/16/2016   Generalized headaches 04/21/2016   History of migraine 04/21/2016   Menopause 04/09/2016   Benign hypertension 04/09/2016   Depressive disorder 04/09/2016   Disorder of thyroid gland 04/09/2016   Dysphagia 04/09/2016   Chronic low back pain with left-sided sciatica 03/24/2016   Asthmatic bronchitis 09/06/2014   GERD (gastroesophageal reflux disease) 09/06/2014   Allergic rhinitis 09/06/2014   Lumbar radicular pain 09/04/2014   Fibromyalgia 09/04/2014   Back pain 06/01/2013   Cubital tunnel syndrome 06/01/2013   Muscle spasms of neck 06/01/2013   Myofascial pain 06/01/2013   Neuropathic pain 06/01/2013  Right wrist pain 06/01/2013   Shoulder pain 06/01/2013   Primary localized osteoarthrosis, lower leg 12/08/2012   Encounter for routine gynecological examination 11/16/2012   Borderline hypertension 06/16/2012   Schatzki's ring 05/21/2012   G6PD deficiency 04/14/2012   Lumbar post-laminectomy syndrome 08/26/2011   SOB (shortness of breath) 04/27/2009    PCP: None  REFERRING PROVIDER: Aura Dials, MD   REFERRING DIAG: Lumbago with sciatica, left side [M54.42], Other chronic pain [G89.29]  Rationale for Evaluation and Treatment Rehabilitation  THERAPY DIAG:  Other low back pain  Chronic left-sided low back pain with left-sided sciatica  Muscle weakness  (generalized)  Abnormal posture  ONSET DATE: February 2016.  SUBJECTIVE:                                                                                                                                                                                           SUBJECTIVE STATEMENT: Pt reports she has noticed improvement in symptoms in LLE; more localized to L hip now. She has placed a heel lift in her pool shoe; notices a difference.      From Eval:  Ashley [name] presents to PT with chronic L lower back pain with sciaticia. It started in Feb 2016 when she fell on her butt and tore her SI joint. She got a fusion a few months later, which was repeated again a few years later. 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 She reports constant pain in her L lower back that radiates into her L LE. She got a spinal stimulator placed in June 2023 which has helped with her lower back pain, but not the leg pain. The only thing that helps reduce leg pain is opioids which she doesn't like to take frequently. She reports wearing a heel lift in her L LE due to scoliosis.   PERTINENT HISTORY:  Anxiety, Depression, Fibromyalgia, HTN, Pre Diabetic, TIA, Scoliosis.   PAIN:  Are you having pain? Yes: NPRS scale: 8/10 Pain location: back and into Entire L LE into last 3 toes  Pain description: Sharp pain, burning. Aggravating factors: Bending over, reaching OH, walking, standing, sitting.  Relieving factors: Opioids, rest, ice, heat, stimulator.   PRECAUTIONS: None  WEIGHT BEARING RESTRICTIONS: No  FALLS:  Has patient fallen in last 6 months? Yes. Number of falls 2, when stepping backwards onto a step, and when cleaning behind bedroom door and trying to move something out of the way. Both falls in posterior direction.   LIVING ENVIRONMENT: Lives with:  Lives with her mother who has dementia Lives in: House/apartment Stairs: Yes: External: 3 steps;  none and 8 in back with  railings.  Has following equipment at home:  Electric scooter if long distance, walking stick.   OCCUPATION: Disability due to scoring 30% on functional capacity evaluation.   PLOF: Independent  PATIENT GOALS: Pt would like to reduce her pain and be more functional.    OBJECTIVE:   DIAGNOSTIC FINDINGS:  None.   PATIENT SURVEYS:  FOTO 40.76%, 45% in 14 vitis.   SCREENING FOR RED FLAGS: Bowel or bladder incontinence: No  COGNITION: Overall cognitive status: Within functional limits for tasks assessed     SENSATION: WFL   POSTURE: rounded shoulders, forward head, and increased lumbar lordosis  PALPATION: Entire L side of QL, Paraspinals and into glutes  LUMBAR ROM:   AROM eval  Flexion WFL, increases symptoms  Extension WFL  Right lateral flexion WFL  Left lateral flexion WFL - increases symptoms.   Right rotation WFL  Left rotation WFL   (Blank rows = not tested)  LOWER EXTREMITY ROM:     Active  Right eval Left eval  Hip flexion [facility] WFL, increases symptoms  Hip extension    Hip abduction    Knee flexion [facility] WFL  Knee extension [facility] WFL, increases symptoms   (Blank rows = not tested)  LOWER EXTREMITY MMT:    MMT Right eval Left eval  Hip flexion 5+ 4 limited by pain  Hip extension    Hip abduction    Knee flexion 5 4 limited by pain  Knee extension 5 4 limited by pain   (Blank rows = not tested)  LUMBAR SPECIAL TESTS:  Slump test: Positive  FUNCTIONAL TESTS:  5 times sit to stand: 19.97 seconds.  Timed up and go (TUG): 12.01 sec  GAIT: Distance walked: 50 ft Assistive device utilized: None Level of assistance: Complete Independence Comments: Pt walks with decreased step length and decreased weight on L LE.   TODAY'S TREATMENT:                                                                                                                              DATE: 09/02/2022 Pt seen for aquatic therapy today.  Treatment took place in water  3.25-4.75 ft in depth at the [place]. Temp of water was 91.  Pt entered/exited the pool via stairs independently with bilat rail.   * in 4+ ft of water: unsupported walking forward/ backward / side stepping / high knee marching forward/ backward; walking forward kick * holding wall:  heel raises, hip abdct/add x 5 x 3; hip ext x 10 each; hip circles CW/CCW 5 each direction, each LE;  split squats x 10  * straddling yellow noodle:  cycling with doggy paddle arms, jumping jack legs and cc ski with feet gently touching ground *  light lumbar rotation with hands resting on kick board x 5 each way (45) * ab set with short blue noodle pull downs * back against wall: using  thin square noodle as strap- ITB, hamstring and adductor stretch each LE  Pt requires the buoyancy and hydrostatic pressure of water for support, and to offload joints by unweighting joint load by at least 50 % in navel deep water and by at least 75-80% in chest to neck deep water.  Viscosity of the water is needed for resistance of strengthening. Water current perturbations provides challenge to standing balance requiring increased core activation.     PATIENT EDUCATION:  Education details: Educated pt on anatomy and physiology of current symptoms, FOTO, diagnosis, prognosis, HEP,  and POC. Person educated: Patient Education method: Customer service manager Education comprehension: verbalized understanding and returned demonstration  HOME EXERCISE PROGRAM: Pt  is already performing LTR and Piriformis stretch after reviewing. She denied a handout.    ASSESSMENT:  CLINICAL IMPRESSION: Positive response to aquatics thus far with centralizing to Lt hip.  She reported reduction of pain to 4-5/10 by end of session.  She tolerated all exercises well.  Pt has some concerns about frequency of appts due to co-pays since she is not working.  Encouraged pt to go to [facility] and walk in pool until  returning to PT; plan to create an aquatic HEP next visit. Goals are ongoing.    OBJECTIVE IMPAIRMENTS: decreased activity tolerance, difficulty walking, decreased balance, decreased endurance, decreased mobility, decreased ROM, decreased strength, impaired flexibility, impaired UE/LE use, postural dysfunction, and pain.  ACTIVITY LIMITATIONS: bending, lifting, carry, locomotion, cleaning, community activity, driving, and or occupation  PERSONAL FACTORS: Anxiety, Depression, Fibromyalgia, HTN, Pre Diabetic, TIA are also affecting patient's functional outcome.  REHAB POTENTIAL: Good  CLINICAL DECISION MAKING: Stable/uncomplicated  EVALUATION COMPLEXITY: Low    GOALS: Short term PT Goals Target date: 09/23/22 Pt will be I and compliant with HEP. Baseline:  Goal status: New Pt will decrease pain by 25% overall Baseline: Goal status: New  Long term PT goals Target date: 10/21/22 Pt will improve ROM to [facility] to improve functional mobility Baseline: Goal status: New Pt will improve  hip/knee strength to at least 5-/5 MMT to improve functional strength Baseline: Goal status: New Pt will improve FOTO to at least 45% functional to show improved function Baseline: Goal status: New Pt will reduce pain by overall 50% overall with usual activity Baseline: Goal status: New Pt will reduce pain to overall less than 2-3/10 with usual activity and work activity. Baseline: Goal status: New Pt will be able to ambulate community distances at least 1000 ft WNL gait pattern without complaints Baseline: Goal status: New      7. Pt will improve her STS by 2.3 seconds for Minimal clinically important difference.         Baseline:         Goal status: New       8. Pt will improve TUG by 3.4 seconds or Minimal clinically important difference.        Baseline:        Goal status: New  PLAN: PT FREQUENCY: 1-3 times per week   PT DURATION: 6-8 weeks  PLANNED INTERVENTIONS (unless  contraindicated): aquatic PT, Canalith repositioning, cryotherapy, Electrical stimulation, Iontophoresis with 4 mg/ml dexamethasome, Moist heat, traction, Ultrasound, gait training, Therapeutic exercise, balance training, neuromuscular re-education, patient/family education, prosthetic training, manual techniques, passive ROM, dry needling, taping, vasopnuematic device, vestibular, spinal manipulations, joint manipulations  PLAN FOR NEXT SESSION: Assess HEP/update PRN, continue to progress functional mobility, strengthen L LE, TrA, and glutes muscles. Decrease patients pain and help  minimize functional deficits. Pt may seek referral for Lt shoulder (plans to see ortho Dr to assess).    Kerin Perna, PTA 09/02/22 4:14 PM [facility] [address] [place], [place], [address] Phone: [phone]   Fax:  [phone]

## 2022-09-08 NOTE — Therapy (Incomplete)
OUTPATIENT PHYSICAL THERAPY THORACOLUMBAR TREATMENT   Patient Name: Ashley [name] MRN: [id number] DOB:1956-09-29, 66 y.o., female Today's Date: 09/08/2022     Past Medical History:  Diagnosis Date   Anemia    Anxiety 2020   Arthritis    Asthma 2013-2014   Depression    Dyspnea 2013   Fibromyalgia 09/04/2014   GERD (gastroesophageal reflux disease)    Headache    History of hiatal hernia    Hypertension    Lumbar radicular pain 09/04/2014   Pre-diabetes    Sacroiliac joint pain    nonunion of left SI joint   Sleep apnea 2016   Spinal headache    trouble waking    TIA (transient ischemic attack)    Wears glasses    Past Surgical History:  Procedure Laterality Date   ABDOMINAL HYSTERECTOMY     BACK SURGERY  2002   BREAST LUMPECTOMY Left 1985   BREAST SURGERY     BUNIONECTOMY     COLONOSCOPY     DILATION AND CURETTAGE OF UTERUS     ESOPHAGEAL MANOMETRY [name] 11/21/2013   Procedure: ESOPHAGEAL MANOMETRY (EM);  Surgeon: Shirley Friar, MD;  Location: [facility];  Service: Endoscopy;  Laterality: [name];   KNEE ARTHROSCOPY W/ MENISCAL REPAIR     LUMBAR LAMINECTOMY     MULTIPLE TOOTH EXTRACTIONS     NASAL SINUS SURGERY     PAROTIDECTOMY Left 09/21/2020   Procedure: LEFT SUPERFICIAL PAROTIDECTOMY WITH NERVE MONITORING;  Surgeon: Osborn Coho, MD;  Location: [facility];  Service: ENT;  Laterality: Left;   SACROILIAC JOINT FUSION Left 08/05/2018   Procedure: REVISION LEFT SACROILIAC JOINT FUSION WITH INSTRUMENTATION AND ALLOGRAFT;  Surgeon: Estill Bamberg, MD;  Location: [facility];  Service: Orthopedics;  Laterality: Left;   TONSILLECTOMY     Patient Active Problem List   Diagnosis Date Noted   Mass of left parotid gland 09/21/2020   Parotid mass 09/21/2020   De Quervain's tenosynovitis 06/22/2019   Trigger middle finger of right hand 04/27/2019   Stiffness of finger joint, right 04/27/2019   Xerostomia 02/23/2019   Dry eyes 02/23/2019   Family history of  diabetes mellitus 01/26/2019   Family history of ischemic heart disease (IHD) 01/26/2019   Family history of stroke 01/26/2019   Hiatal hernia 01/26/2019   Knee pain, left 01/26/2019   Sacroiliac joint dysfunction 08/05/2018   Spondylosis of lumbar region without myelopathy or radiculopathy 05/16/2016   Generalized headaches 04/21/2016   History of migraine 04/21/2016   Menopause 04/09/2016   Benign hypertension 04/09/2016   Depressive disorder 04/09/2016   Disorder of thyroid gland 04/09/2016   Dysphagia 04/09/2016   Chronic low back pain with left-sided sciatica 03/24/2016   Asthmatic bronchitis 09/06/2014   GERD (gastroesophageal reflux disease) 09/06/2014   Allergic rhinitis 09/06/2014   Lumbar radicular pain 09/04/2014   Fibromyalgia 09/04/2014   Back pain 06/01/2013   Cubital tunnel syndrome 06/01/2013   Muscle spasms of neck 06/01/2013   Myofascial pain 06/01/2013   Neuropathic pain 06/01/2013   Right wrist pain 06/01/2013   Shoulder pain 06/01/2013   Primary localized osteoarthrosis, lower leg 12/08/2012   Encounter for routine gynecological examination 11/16/2012   Borderline hypertension 06/16/2012   Schatzki's ring 05/21/2012   G6PD deficiency 04/14/2012   Lumbar post-laminectomy syndrome 08/26/2011   SOB (shortness of breath) 04/27/2009    PCP: None  REFERRING PROVIDER: Henrine Screws, MD   REFERRING DIAG: Lumbago with sciatica, left side [M54.42], Other chronic pain [G89.29]  Rationale for Evaluation and Treatment Rehabilitation  THERAPY DIAG:  No diagnosis found.  ONSET DATE: February 2016.  SUBJECTIVE:                                                                                                                                                                                           SUBJECTIVE STATEMENT: Pt reports she has noticed improvement in symptoms in LLE; more localized to L hip now. She has placed a heel lift in her pool shoe; notices a  difference.    Ask about:  HEP and prior lumbar surgery    From Eval:  Gesenia presents to PT with chronic L lower back pain with sciaticia. It started in Feb 2016 when she fell on her butt and tore her SI joint. She got a fusion a few months later, which was repeated again a few years later. 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 She reports constant pain in her L lower back that radiates into her L LE. She got a spinal stimulator placed in June 2023 which has helped with her lower back pain, but not the leg pain. The only thing that helps reduce leg pain is opioids which she doesn't like to take frequently. She reports wearing a heel lift in her L LE due to scoliosis.   PERTINENT HISTORY:  Anxiety, Depression, Fibromyalgia, HTN, Pre Diabetic, TIA, Scoliosis.   PAIN:  Are you having pain? Yes: NPRS scale: 8/10 Pain location: back and into Entire L LE into last 3 toes  Pain description: Sharp pain, burning. Aggravating factors: Bending over, reaching OH, walking, standing, sitting.  Relieving factors: Opioids, rest, ice, heat, stimulator.   PRECAUTIONS: None  WEIGHT BEARING RESTRICTIONS: No  FALLS:  Has patient fallen in last 6 months? Yes. Number of falls 2, when stepping backwards onto a step, and when cleaning behind bedroom door and trying to move something out of the way. Both falls in posterior direction.   LIVING ENVIRONMENT: Lives with:  Lives with her mother who has dementia Lives in: House/apartment Stairs: Yes: External: 3 steps; none and 8 in back with railings.  Has following equipment at home:  Electric scooter if long distance, walking stick.   OCCUPATION: Disability due to scoring 30% on functional capacity evaluation.   PLOF: Independent  PATIENT GOALS: Pt would like to reduce her pain and be more functional.    OBJECTIVE:   DIAGNOSTIC FINDINGS:  None.   PATIENT SURVEYS:  FOTO 40.76%, 45% in 14 vitis.   SCREENING FOR  RED FLAGS: Bowel or bladder incontinence: No  COGNITION: Overall  cognitive status: Within functional limits for tasks assessed     SENSATION: WFL   POSTURE: rounded shoulders, forward head, and increased lumbar lordosis  PALPATION: Entire L side of QL, Paraspinals and into glutes  LUMBAR ROM:   AROM eval  Flexion WFL, increases symptoms  Extension WFL  Right lateral flexion WFL  Left lateral flexion WFL - increases symptoms.   Right rotation WFL  Left rotation WFL   (Blank rows = not tested)  LOWER EXTREMITY ROM:     Active  Right eval Left eval  Hip flexion [facility] WFL, increases symptoms  Hip extension    Hip abduction    Knee flexion [facility] WFL  Knee extension [facility] WFL, increases symptoms   (Blank rows = not tested)  LOWER EXTREMITY MMT:    MMT Right eval Left eval  Hip flexion 5+ 4 limited by pain  Hip extension    Hip abduction    Knee flexion 5 4 limited by pain  Knee extension 5 4 limited by pain   (Blank rows = not tested)  LUMBAR SPECIAL TESTS:  Slump test: Positive  FUNCTIONAL TESTS:  5 times sit to stand: 19.97 seconds.  Timed up and go (TUG): 12.01 sec  GAIT: Distance walked: 50 ft Assistive device utilized: None Level of assistance: Complete Independence Comments: Pt walks with decreased step length and decreased weight on L LE.   TODAY'S TREATMENT:                                                                                                                              DATE: 09/02/2022   Reviewed HEP Educated pt in correct performance and palpation of TrA contraction.  Pt performed with 5 sec hold Supine TrA with marching Supine clams with TrA Supine HS stretch Seated HS stretch  Sit to stand with TrA  Manual Therapy: Pt received STM to L sided lumbar paraspinals and L glute. Supine manual HS stretch with passive AP's    PATIENT EDUCATION:  Education details: Educated pt on anatomy and physiology of current symptoms, FOTO,  diagnosis, prognosis, HEP,  and POC. Person educated: Patient Education method: Medical illustrator Education comprehension: verbalized understanding and returned demonstration  HOME EXERCISE PROGRAM: Pt  is already performing LTR and Piriformis stretch after reviewing. She denied a handout.    ASSESSMENT:  CLINICAL IMPRESSION: Positive response to aquatics thus far with centralizing to Lt hip.  She reported reduction of pain to 4-5/10 by end of session.  She tolerated all exercises well.  Pt has some concerns about frequency of appts due to co-pays since she is not working.  Encouraged pt to go to [facility] and walk in pool until returning to PT; plan to create an aquatic HEP next visit. Goals are ongoing.    OBJECTIVE IMPAIRMENTS: decreased activity tolerance, difficulty walking, decreased balance, decreased endurance, decreased mobility, decreased ROM, decreased strength, impaired flexibility, impaired UE/LE use, postural dysfunction, and  pain.  ACTIVITY LIMITATIONS: bending, lifting, carry, locomotion, cleaning, community activity, driving, and or occupation  PERSONAL FACTORS: Anxiety, Depression, Fibromyalgia, HTN, Pre Diabetic, TIA are also affecting patient's functional outcome.  REHAB POTENTIAL: Good  CLINICAL DECISION MAKING: Stable/uncomplicated  EVALUATION COMPLEXITY: Low    GOALS: Short term PT Goals Target date: 09/23/22 Pt will be I and compliant with HEP. Baseline:  Goal status: New Pt will decrease pain by 25% overall Baseline: Goal status: New  Long term PT goals Target date: 10/21/22 Pt will improve ROM to [facility] to improve functional mobility Baseline: Goal status: New Pt will improve  hip/knee strength to at least 5-/5 MMT to improve functional strength Baseline: Goal status: New Pt will improve FOTO to at least 45% functional to show improved function Baseline: Goal status: New Pt will reduce pain by overall 50% overall with  usual activity Baseline: Goal status: New Pt will reduce pain to overall less than 2-3/10 with usual activity and work activity. Baseline: Goal status: New Pt will be able to ambulate community distances at least 1000 ft WNL gait pattern without complaints Baseline: Goal status: New      7. Pt will improve her STS by 2.3 seconds for Minimal clinically important difference.         Baseline:         Goal status: New       8. Pt will improve TUG by 3.4 seconds or Minimal clinically important difference.        Baseline:        Goal status: New  PLAN: PT FREQUENCY: 1-3 times per week   PT DURATION: 6-8 weeks  PLANNED INTERVENTIONS (unless contraindicated): aquatic PT, Canalith repositioning, cryotherapy, Electrical stimulation, Iontophoresis with 4 mg/ml dexamethasome, Moist heat, traction, Ultrasound, gait training, Therapeutic exercise, balance training, neuromuscular re-education, patient/family education, prosthetic training, manual techniques, passive ROM, dry needling, taping, vasopnuematic device, vestibular, spinal manipulations, joint manipulations  PLAN FOR NEXT SESSION: Assess HEP/update PRN, continue to progress functional mobility, strengthen L LE, TrA, and glutes muscles. Decrease patients pain and help minimize functional deficits. Pt may seek referral for Lt shoulder (plans to see ortho Dr to assess).    Mayer Camel, PTA 09/08/22 9:29 PM [facility] [address] [place], [place], [address] Phone: [phone]   Fax:  [phone]

## 2022-09-09 ENCOUNTER — Ambulatory Visit (HOSPITAL_BASED_OUTPATIENT_CLINIC_OR_DEPARTMENT_OTHER): Payer: Federal, State, Local not specified - PPO | Admitting: Physical Therapy

## 2022-09-10 ENCOUNTER — Ambulatory Visit (HOSPITAL_BASED_OUTPATIENT_CLINIC_OR_DEPARTMENT_OTHER): Payer: Federal, State, Local not specified - PPO | Attending: Neurosurgery | Admitting: Physical Therapy

## 2022-09-10 ENCOUNTER — Encounter (HOSPITAL_BASED_OUTPATIENT_CLINIC_OR_DEPARTMENT_OTHER): Payer: Self-pay | Admitting: Physical Therapy

## 2022-09-10 DIAGNOSIS — M5442 Lumbago with sciatica, left side: Secondary | ICD-10-CM | POA: Insufficient documentation

## 2022-09-10 DIAGNOSIS — G8929 Other chronic pain: Secondary | ICD-10-CM | POA: Diagnosis not present

## 2022-09-10 DIAGNOSIS — R293 Abnormal posture: Secondary | ICD-10-CM | POA: Insufficient documentation

## 2022-09-10 DIAGNOSIS — M5459 Other low back pain: Secondary | ICD-10-CM | POA: Insufficient documentation

## 2022-09-10 DIAGNOSIS — M6281 Muscle weakness (generalized): Secondary | ICD-10-CM | POA: Diagnosis not present

## 2022-09-10 NOTE — Therapy (Signed)
OUTPATIENT PHYSICAL THERAPY THORACOLUMBAR TREATMENT   Patient Name: Ashley [name] MRN: [id number] DOB:05-Jun-1956, 66 y.o., female Today's Date: 09/11/2022   PT End of Session - 09/10/22 1518     Visit Number 4    Number of Visits 18    Date for PT Re-Evaluation 10/28/22    Authorization Type BCBS/[employer] PPO    PT Start Time 1440    PT Stop Time 1520    PT Time Calculation (min) 40 min    Activity Tolerance Patient tolerated treatment well    Behavior During Therapy [facility] for tasks assessed/performed              Past Medical History:  Diagnosis Date   Anemia    Anxiety 2020   Arthritis    Asthma 2013-2014   Depression    Dyspnea 2013   Fibromyalgia 09/04/2014   GERD (gastroesophageal reflux disease)    Headache    History of hiatal hernia    Hypertension    Lumbar radicular pain 09/04/2014   Pre-diabetes    Sacroiliac joint pain    nonunion of left SI joint   Sleep apnea 2016   Spinal headache    trouble waking    TIA (transient ischemic attack)    Wears glasses    Past Surgical History:  Procedure Laterality Date   ABDOMINAL HYSTERECTOMY     BACK SURGERY  2002   BREAST LUMPECTOMY Left 1985   BREAST SURGERY     BUNIONECTOMY     COLONOSCOPY     DILATION AND CURETTAGE OF UTERUS     ESOPHAGEAL MANOMETRY [name] 11/21/2013   Procedure: ESOPHAGEAL MANOMETRY (EM);  Surgeon: Shirley Friar, MD;  Location: [facility];  Service: Endoscopy;  Laterality: [name];   KNEE ARTHROSCOPY W/ MENISCAL REPAIR     LUMBAR LAMINECTOMY     MULTIPLE TOOTH EXTRACTIONS     NASAL SINUS SURGERY     PAROTIDECTOMY Left 09/21/2020   Procedure: LEFT SUPERFICIAL PAROTIDECTOMY WITH NERVE MONITORING;  Surgeon: Osborn Coho, MD;  Location: [facility] OR;  Service: ENT;  Laterality: Left;   SACROILIAC JOINT FUSION Left 08/05/2018   Procedure: REVISION LEFT SACROILIAC JOINT FUSION WITH INSTRUMENTATION AND ALLOGRAFT;  Surgeon: Estill Bamberg, MD;  Location: [facility] OR;  Service:  Orthopedics;  Laterality: Left;   TONSILLECTOMY     Patient Active Problem List   Diagnosis Date Noted   Mass of left parotid gland 09/21/2020   Parotid mass 09/21/2020   De Quervain's tenosynovitis 06/22/2019   Trigger middle finger of right hand 04/27/2019   Stiffness of finger joint, right 04/27/2019   Xerostomia 02/23/2019   Dry eyes 02/23/2019   Family history of diabetes mellitus 01/26/2019   Family history of ischemic heart disease (IHD) 01/26/2019   Family history of stroke 01/26/2019   Hiatal hernia 01/26/2019   Knee pain, left 01/26/2019   Sacroiliac joint dysfunction 08/05/2018   Spondylosis of lumbar region without myelopathy or radiculopathy 05/16/2016   Generalized headaches 04/21/2016   History of migraine 04/21/2016   Menopause 04/09/2016   Benign hypertension 04/09/2016   Depressive disorder 04/09/2016   Disorder of thyroid gland 04/09/2016   Dysphagia 04/09/2016   Chronic low back pain with left-sided sciatica 03/24/2016   Asthmatic bronchitis 09/06/2014   GERD (gastroesophageal reflux disease) 09/06/2014   Allergic rhinitis 09/06/2014   Lumbar radicular pain 09/04/2014   Fibromyalgia 09/04/2014   Back pain 06/01/2013   Cubital tunnel syndrome 06/01/2013   Muscle spasms of neck 06/01/2013  Myofascial pain 06/01/2013   Neuropathic pain 06/01/2013   Right wrist pain 06/01/2013   Shoulder pain 06/01/2013   Primary localized osteoarthrosis, lower leg 12/08/2012   Encounter for routine gynecological examination 11/16/2012   Borderline hypertension 06/16/2012   Schatzki's ring 05/21/2012   G6PD deficiency 04/14/2012   Lumbar post-laminectomy syndrome 08/26/2011   SOB (shortness of breath) 04/27/2009    PCP: None  REFERRING PROVIDER: Henrine Screws, MD   REFERRING DIAG: Lumbago with sciatica, left side [M54.42], Other chronic pain [G89.29]  Rationale for Evaluation and Treatment Rehabilitation  THERAPY DIAG:  Other low back pain  Muscle  weakness (generalized)  Abnormal posture  ONSET DATE: February 2016.  SUBJECTIVE:                                                                                                                                                                                           SUBJECTIVE STATEMENT: Pt reports she performs her home exercises daily which does help.  Pt states she has had PT multiple times for years.  Pt states she felt "wonderful" during the aquatic therapy.  Pt reports she had improved L LE pain and increased L hip pain after prior aquatic Rx.  Pt reports when she started therapy her pain was shooting down L LE to her foot.  Pt states her back feels better when she tightens her core in sitting.     From Eval:  [name] presents to PT with chronic L lower back pain with sciaticia. It started in Feb 2016 when she fell on her butt and tore her SI joint. She got a fusion a few months later, which was repeated again a few years later. 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 She reports constant pain in her L lower back that radiates into her L LE. She got a spinal stimulator placed in June 2023 which has helped with her lower back pain, but not the leg pain. The only thing that helps reduce leg pain is opioids which she doesn't like to take frequently. She reports wearing a heel lift in her L LE due to scoliosis.   PERTINENT HISTORY:  SI joint fusion in 2019 and SI joint revision in 2020 ; Lumbar laminectomy and foraminatomy in 2002 Spinal cord stimulator in June 2023 Anxiety, Depression, Fibromyalgia, HTN, Pre Diabetic, TIA, Scoliosis.   PAIN:  Are you having pain? Yes: NPRS scale: 7/10 Pain location:  L posterior hip, L posterior thigh, L posterior knee Pain description: Sharp pain, burning. Aggravating factors: Bending over, reaching OH, walking, standing, sitting.  Relieving factors: Opioids, rest,  ice, heat, stimulator.   PRECAUTIONS: None  WEIGHT  BEARING RESTRICTIONS: No  FALLS:  Has patient fallen in last 6 months? Yes. Number of falls 2, when stepping backwards onto a step, and when cleaning behind bedroom door and trying to move something out of the way. Both falls in posterior direction.   LIVING ENVIRONMENT: Lives with:  Lives with her mother who has dementia Lives in: House/apartment Stairs: Yes: External: 3 steps; none and 8 in back with railings.  Has following equipment at home:  Electric scooter if long distance, walking stick.   OCCUPATION: Disability due to scoring 30% on functional capacity evaluation.   PLOF: Independent  PATIENT GOALS: Pt would like to reduce her pain and be more functional.    OBJECTIVE:   DIAGNOSTIC FINDINGS:  None.     TODAY'S TREATMENT:                                                                                                                              DATE: 09/02/2022  Therapeutic Exercise: Reviewed pt presentation, pain level, and response to prior Rx.  Reviewed HEP Educated pt in correct performance and palpation of TrA contraction.  Pt performed TrA contractions with and without 5 sec hold. Supine TrA with marching 2x10 Supine clams with TrA with RTB 3x10 Sit to stand with TrA x 10 reps  PT gave pt a HEP handout and educated pt in correct form and appropriate frequency.  Pt instructed she should not have pain with HEP.    Manual Therapy: Pt received STM to L sided lumbar paraspinals and L glute to improve soft tissue mobility, tightness, and pain.     PATIENT EDUCATION:  Education details:relevant anatomy, diagnosis, prognosis, HEP, exercise form and rationale, and POC. Person educated: Patient Education method: Medical illustrator, verbal cues, handout Education comprehension: verbalized understanding and returned demonstration  HOME EXERCISE PROGRAM: Pt  is already performing LTR, bridges, supine HS stretch, and Piriformis stretch after reviewing. She  denied a handout.  Access Code: [email or web address] URL: [email or web address] Date: 09/10/2022 Prepared by: Aaron Edelman  Exercises - Supine Transversus Abdominis Bracing - Hands on Stomach  - 2 x daily - 7 x weekly - 2 sets - 10 reps - 5 seconds hold - Supine March  - 1-2 x daily - 7 x weekly - 2 sets - 10 reps - Hooklying Clamshell with Resistance  - 1 x daily - 4 x weekly - 2 sets - 10 reps   ASSESSMENT:  CLINICAL IMPRESSION:  Pt is improving with pain and sx's as evidenced by subjective reports.  Pt reports centralizing pain in L LE.  Pt has exercises she is performing at home.  PT established core HEP today and gave pt a HEP handout.  Pt performed exercises well with cuing and instruction in correct form.  She demonstrates good understanding of HEP.  Pt has tightness and tenderness in L sided paraspinals and glute.  Pt responded  well to Rx reporting stating she felt better and had improved pain after Rx.  Pt should cont to benefit from cont skilled PT services to address ongoing goals and to improve overall function.    OBJECTIVE IMPAIRMENTS: decreased activity tolerance, difficulty walking, decreased balance, decreased endurance, decreased mobility, decreased ROM, decreased strength, impaired flexibility, impaired UE/LE use, postural dysfunction, and pain.  ACTIVITY LIMITATIONS: bending, lifting, carry, locomotion, cleaning, community activity, driving, and or occupation  PERSONAL FACTORS: Anxiety, Depression, Fibromyalgia, HTN, Pre Diabetic, TIA are also affecting patient's functional outcome.  REHAB POTENTIAL: Good  CLINICAL DECISION MAKING: Stable/uncomplicated  EVALUATION COMPLEXITY: Low    GOALS: Short term PT Goals Target date: 09/23/22 Pt will be I and compliant with HEP. Baseline:  Goal status: New Pt will decrease pain by 25% overall Baseline: Goal status: New  Long term PT goals Target date: 10/21/22 Pt will improve ROM to [facility] to improve functional  mobility Baseline: Goal status: New Pt will improve  hip/knee strength to at least 5-/5 MMT to improve functional strength Baseline: Goal status: New Pt will improve FOTO to at least 45% functional to show improved function Baseline: Goal status: New Pt will reduce pain by overall 50% overall with usual activity Baseline: Goal status: New Pt will reduce pain to overall less than 2-3/10 with usual activity and work activity. Baseline: Goal status: New Pt will be able to ambulate community distances at least 1000 ft WNL gait pattern without complaints Baseline: Goal status: New      7. Pt will improve her STS by 2.3 seconds for Minimal clinically important difference.         Baseline:         Goal status: New       8. Pt will improve TUG by 3.4 seconds or Minimal clinically important difference.        Baseline:        Goal status: New  PLAN: PT FREQUENCY: 1-3 times per week   PT DURATION: 6-8 weeks  PLANNED INTERVENTIONS (unless contraindicated): aquatic PT, Canalith repositioning, cryotherapy, Electrical stimulation, Iontophoresis with 4 mg/ml dexamethasome, Moist heat, traction, Ultrasound, gait training, Therapeutic exercise, balance training, neuromuscular re-education, patient/family education, prosthetic training, manual techniques, passive ROM, dry needling, taping, vasopnuematic device, vestibular, spinal manipulations, joint manipulations  PLAN FOR NEXT SESSION:  continue to progress functional mobility, strengthen L LE, TrA, and glutes muscles. Decrease patients pain and help minimize functional deficits. Pt may seek referral for Lt shoulder (plans to see ortho Dr to assess).  Cont with STW and core strengthening.   Perform Supine manual HS stretch with passive AP's next visit.    Audie Clear III PT, DPT 09/11/22 11:15 PM

## 2022-09-12 ENCOUNTER — Ambulatory Visit (HOSPITAL_BASED_OUTPATIENT_CLINIC_OR_DEPARTMENT_OTHER): Payer: Federal, State, Local not specified - PPO | Admitting: Physical Therapy

## 2022-09-13 NOTE — Therapy (Signed)
OUTPATIENT PHYSICAL THERAPY THORACOLUMBAR TREATMENT   Patient Name: Ashley ReiningValerie K Jones-Robinson MRN: [id number] DOB:Jul 21, 1956, 66 y.o., female Today's Date: 09/15/2022   PT End of Session - 09/15/22 1106     Visit Number 5    Number of Visits 18    Date for PT Re-Evaluation 10/28/22    Authorization Type [employer] PPO    PT Start Time 1028    PT Stop Time 1106    PT Time Calculation (min) 38 min    Activity Tolerance Patient tolerated treatment well    [facility]/performed               Past Medical History:  Diagnosis Date   Anemia    Anxiety 2020   Arthritis    Asthma 2013-2014   Depression    Dyspnea 2013   Fibromyalgia 09/04/2014   GERD (gastroesophageal reflux disease)    Headache    History of hiatal hernia    Hypertension    Lumbar radicular pain 09/04/2014   Pre-diabetes    Sacroiliac joint pain    nonunion of left SI joint   Sleep apnea 2016   Spinal headache    trouble waking    TIA (transient ischemic attack)    Wears glasses    Past Surgical History:  Procedure Laterality Date   ABDOMINAL HYSTERECTOMY     BACK SURGERY  2002   BREAST LUMPECTOMY Left 1985   BREAST SURGERY     BUNIONECTOMY     COLONOSCOPY     DILATION AND CURETTAGE OF UTERUS     ESOPHAGEAL MANOMETRY [name] 11/21/2013   Procedure: ESOPHAGEAL MANOMETRY (EM);  Surgeon: Shirley FriarVincent C. Schooler, MD;  Location: [facility];  Service: Endoscopy;  Laterality: [name];   KNEE ARTHROSCOPY W/ MENISCAL REPAIR     LUMBAR LAMINECTOMY     MULTIPLE TOOTH EXTRACTIONS     NASAL SINUS SURGERY     PAROTIDECTOMY Left 09/21/2020   Procedure: LEFT SUPERFICIAL PAROTIDECTOMY WITH NERVE MONITORING;  Surgeon: Osborn CohoShoemaker, David, MD;  [facility];  Service: ENT;  Laterality: Left;   SACROILIAC JOINT FUSION Left 08/05/2018   Procedure: REVISION LEFT SACROILIAC JOINT FUSION WITH INSTRUMENTATION AND ALLOGRAFT;  Surgeon: Estill Bambergumonski, Mark, MD;  Location: [facility] OR;  Service:  Orthopedics;  Laterality: Left;   TONSILLECTOMY     Patient Active Problem List   Diagnosis Date Noted   Mass of left parotid gland 09/21/2020   Parotid mass 09/21/2020   De Quervain's tenosynovitis 06/22/2019   Trigger middle finger of right hand 04/27/2019   Stiffness of finger joint, right 04/27/2019   Xerostomia 02/23/2019   Dry eyes 02/23/2019   Family history of diabetes mellitus 01/26/2019   Family history of ischemic heart disease (IHD) 01/26/2019   Family history of stroke 01/26/2019   Hiatal hernia 01/26/2019   Knee pain, left 01/26/2019   Sacroiliac joint dysfunction 08/05/2018   Spondylosis of lumbar region without myelopathy or radiculopathy 05/16/2016   Generalized headaches 04/21/2016   History of migraine 04/21/2016   Menopause 04/09/2016   Benign hypertension 04/09/2016   Depressive disorder 04/09/2016   Disorder of thyroid gland 04/09/2016   Dysphagia 04/09/2016   Chronic low back pain with left-sided sciatica 03/24/2016   Asthmatic bronchitis 09/06/2014   GERD (gastroesophageal reflux disease) 09/06/2014   Allergic rhinitis 09/06/2014   Lumbar radicular pain 09/04/2014   Fibromyalgia 09/04/2014   Back pain 06/01/2013   Cubital tunnel syndrome 06/01/2013   Muscle spasms of neck 06/01/2013  Myofascial pain 06/01/2013   Neuropathic pain 06/01/2013   Right wrist pain 06/01/2013   Shoulder pain 06/01/2013   Primary localized osteoarthrosis, lower leg 12/08/2012   Encounter for routine gynecological examination 11/16/2012   Borderline hypertension 06/16/2012   Schatzki's ring 05/21/2012   G6PD deficiency 04/14/2012   Lumbar post-laminectomy syndrome 08/26/2011   SOB (shortness of breath) 04/27/2009    PCP: None  REFERRING PROVIDER: Henrine Screws, MD   REFERRING DIAG: Lumbago with sciatica, left side [M54.42], Other chronic pain [G89.29]  Rationale for Evaluation and Treatment Rehabilitation  THERAPY DIAG:  Other low back pain  Muscle  weakness (generalized)  Abnormal posture  ONSET DATE: February 2016.  SUBJECTIVE:                                                                                                                                                                                           SUBJECTIVE STATEMENT: Pt reports she performs her home exercises daily which does help.  Pt was very busy this weekend.  She stated she had 3-4 events this past weekend and her back was tired and angry.  She had significant pain with cooking.  She cooked and prepared food for 45 mins.  Pt is hurting a lot today and has leg weakness when she hurts like this.  Pt states her fibromyalgia is flared up.  Pt states she felt good after prior Rx.       From Eval:  Alexsus presents to PT with chronic L lower back pain with sciaticia. It started in Feb 2016 when she fell on her butt and tore her SI joint. She got a fusion a few months later, which was repeated again a few years later. 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 She reports constant pain in her L lower back that radiates into her L LE. She got a spinal stimulator placed in June 2023 which has helped with her lower back pain, but not the leg pain. The only thing that helps reduce leg pain is opioids which she doesn't like to take frequently. She reports wearing a heel lift in her L LE due to scoliosis.   PERTINENT HISTORY:  SI joint fusion in 2019 and SI joint revision in 2020 ; Lumbar laminectomy and foraminatomy in 2002 Spinal cord stimulator in June 2023 Anxiety, Depression, Fibromyalgia, HTN, Pre Diabetic, TIA, Scoliosis.   PAIN:  Are you having pain? Yes: NPRS scale: "almost a 9/10"   /   7/10  Pain location:  L posterior hip, L posterior thigh, L posterior knee, L sided lumbar   /  R hip Pain description: Sharp pain, burning. Aggravating factors: Bending over, reaching OH, walking, standing, sitting.  Relieving factors: Opioids, rest,  ice, heat, stimulator.   PRECAUTIONS: None  WEIGHT BEARING RESTRICTIONS: No  FALLS:  Has patient fallen in last 6 months? Yes. Number of falls 2, when stepping backwards onto a step, and when cleaning behind bedroom door and trying to move something out of the way. Both falls in posterior direction.   LIVING ENVIRONMENT: Lives with:  Lives with her mother who has dementia Lives in: House/apartment Stairs: Yes: External: 3 steps; none and 8 in back with railings.  Has following equipment at home:  Electric scooter if long distance, walking stick.   OCCUPATION: Disability due to scoring 30% on functional capacity evaluation.   PLOF: Independent  PATIENT GOALS: Pt would like to reduce her pain and be more functional.    OBJECTIVE:   DIAGNOSTIC FINDINGS:  None.     TODAY'S TREATMENT:  Therapeutic Exercise: Educated pt in correct performance and palpation of TrA contraction.  Pt performed TrA contractions with 5 sec hold. Supine TrA with marching 2x10 Supine clams with TrA with RTB 2x10 Supine alt LE extension with TrA 2x10 Supine shoulder flex and ext with TrA 2x10     Manual Therapy: Reviewed pt presentation, pain level, and response to prior Rx. Pt received STM to L sided lumbar paraspinals and L glute to improve soft tissue mobility, tightness, and pain in R S/L'ing with pillow b/w knees. Pt received L HS stretch with passive AP's for nn flossing 3x30 sec on.  PT educated pt with how to perform at home.  She is performing H stretch at home and PT instructed pt in performing AP's.      PATIENT EDUCATION:  Education details:relevant anatomy, diagnosis, prognosis, HEP, exercise form and rationale, and POC. Person educated: Patient Education method: Medical illustrator, verbal cues Education comprehension: verbalized understanding and returned demonstration, verbal cues required  HOME EXERCISE PROGRAM: Pt  is already performing LTR, bridges, supine HS  stretch, and Piriformis stretch after reviewing. She denied a handout.  Access Code: [email or web address] URL: [email or web address] Date: 09/10/2022 Prepared by: Aaron Edelman  Exercises - Supine Transversus Abdominis Bracing - Hands on Stomach  - 2 x daily - 7 x weekly - 2 sets - 10 reps - 5 seconds hold - Supine March  - 1-2 x daily - 7 x weekly - 2 sets - 10 reps - Hooklying Clamshell with Resistance  - 1 x daily - 4 x weekly - 2 sets - 10 reps   ASSESSMENT:  CLINICAL IMPRESSION:  Pt presents to Rx with increased pain today.  She was very busy this past weekend and reports having increased pain.  She thinks her fibromyalgia may be flaring up also.  Pt performed exercises well with cuing and instruction in correct form without c/o's.  She has tightness, trigger points, and tenderness in L sided paraspinals and glute.  Pt had improved tightness with STM.  Pt responded well to Rx reporting improved pain from almost a 9/10 before Rx to 5/10 after Rx.  Pt states she feels her foot more after supine HS stretch with nn flossing technique.  Pt should cont to benefit from cont skilled PT services to address ongoing goals and to improve overall function.    OBJECTIVE IMPAIRMENTS: decreased activity tolerance, difficulty walking, decreased balance, decreased endurance, decreased mobility, decreased ROM, decreased strength, impaired flexibility, impaired UE/LE use, postural dysfunction, and pain.  ACTIVITY LIMITATIONS: bending, lifting,  carry, locomotion, cleaning, community activity, driving, and or occupation  PERSONAL FACTORS: Anxiety, Depression, Fibromyalgia, HTN, Pre Diabetic, TIA are also affecting patient's functional outcome.  REHAB POTENTIAL: Good  CLINICAL DECISION MAKING: Stable/uncomplicated  EVALUATION COMPLEXITY: Low    GOALS: Short term PT Goals Target date: 09/23/22 Pt will be I and compliant with HEP. Baseline:  Goal status: New Pt will decrease pain by 25%  overall Baseline: Goal status: New  Long term PT goals Target date: 10/21/22 Pt will improve ROM to [facility] to improve functional mobility Baseline: Goal status: New Pt will improve  hip/knee strength to at least 5-/5 MMT to improve functional strength Baseline: Goal status: New Pt will improve FOTO to at least 45% functional to show improved function Baseline: Goal status: New Pt will reduce pain by overall 50% overall with usual activity Baseline: Goal status: New Pt will reduce pain to overall less than 2-3/10 with usual activity and work activity. Baseline: Goal status: New Pt will be able to ambulate community distances at least 1000 ft WNL gait pattern without complaints Baseline: Goal status: New      7. Pt will improve her STS by 2.3 seconds for Minimal clinically important difference.         Baseline:         Goal status: New       8. Pt will improve TUG by 3.4 seconds or Minimal clinically important difference.        Baseline:        Goal status: New  PLAN: PT FREQUENCY: 1-3 times per week   PT DURATION: 6-8 weeks  PLANNED INTERVENTIONS (unless contraindicated): aquatic PT, Canalith repositioning, cryotherapy, Electrical stimulation, Iontophoresis with 4 mg/ml dexamethasome, Moist heat, traction, Ultrasound, gait training, Therapeutic exercise, balance training, neuromuscular re-education, patient/family education, prosthetic training, manual techniques, passive ROM, dry needling, taping, vasopnuematic device, vestibular, spinal manipulations, joint manipulations  PLAN FOR NEXT SESSION:  continue to progress functional mobility, strengthen L LE, TrA, and glutes muscles. Decrease patients pain and help minimize functional deficits. Pt may seek referral for Lt shoulder (plans to see ortho Dr to assess).  Cont with STW and core strengthening.   Cont with supine manual HS stretch with passive AP's.    Audie Clear III PT, DPT 09/15/22 3:02 PM

## 2022-09-15 ENCOUNTER — Encounter (HOSPITAL_BASED_OUTPATIENT_CLINIC_OR_DEPARTMENT_OTHER): Payer: Self-pay | Admitting: Physical Therapy

## 2022-09-15 ENCOUNTER — Ambulatory Visit (HOSPITAL_BASED_OUTPATIENT_CLINIC_OR_DEPARTMENT_OTHER): Payer: Federal, State, Local not specified - PPO | Admitting: Physical Therapy

## 2022-09-15 DIAGNOSIS — R293 Abnormal posture: Secondary | ICD-10-CM

## 2022-09-15 DIAGNOSIS — M6281 Muscle weakness (generalized): Secondary | ICD-10-CM | POA: Diagnosis not present

## 2022-09-15 DIAGNOSIS — M5442 Lumbago with sciatica, left side: Secondary | ICD-10-CM | POA: Diagnosis not present

## 2022-09-15 DIAGNOSIS — M792 Neuralgia and neuritis, unspecified: Secondary | ICD-10-CM | POA: Diagnosis not present

## 2022-09-15 DIAGNOSIS — M5459 Other low back pain: Secondary | ICD-10-CM | POA: Diagnosis not present

## 2022-09-15 DIAGNOSIS — G580 Intercostal neuropathy: Secondary | ICD-10-CM | POA: Diagnosis not present

## 2022-09-15 DIAGNOSIS — G8929 Other chronic pain: Secondary | ICD-10-CM | POA: Diagnosis not present

## 2022-09-17 ENCOUNTER — Other Ambulatory Visit: Payer: Self-pay | Admitting: Psychiatry

## 2022-09-17 ENCOUNTER — Encounter (HOSPITAL_BASED_OUTPATIENT_CLINIC_OR_DEPARTMENT_OTHER): Payer: Self-pay | Admitting: Physical Therapy

## 2022-09-17 ENCOUNTER — Ambulatory Visit (HOSPITAL_BASED_OUTPATIENT_CLINIC_OR_DEPARTMENT_OTHER): Payer: Federal, State, Local not specified - PPO | Admitting: Physical Therapy

## 2022-09-17 DIAGNOSIS — G8929 Other chronic pain: Secondary | ICD-10-CM

## 2022-09-17 DIAGNOSIS — M6281 Muscle weakness (generalized): Secondary | ICD-10-CM | POA: Diagnosis not present

## 2022-09-17 DIAGNOSIS — R293 Abnormal posture: Secondary | ICD-10-CM | POA: Diagnosis not present

## 2022-09-17 DIAGNOSIS — M5459 Other low back pain: Secondary | ICD-10-CM

## 2022-09-17 DIAGNOSIS — M5442 Lumbago with sciatica, left side: Secondary | ICD-10-CM | POA: Diagnosis not present

## 2022-09-17 NOTE — Therapy (Signed)
OUTPATIENT PHYSICAL THERAPY THORACOLUMBAR TREATMENT   Patient Name: Ashley [name] MRN: [id number] DOB:1955/12/26, 66 y.o., female Today's Date: 09/17/2022   PT End of Session - 09/17/22 1206     Visit Number 6    Number of Visits 18    Date for PT Re-Evaluation 10/28/22    Authorization Type BCBS/[employer]ER[employer] PPO    PT Start Time 1200    PT Stop Time 1250    PT Time Calculation (min) 50 min    Activity Tolerance Patient tolerated treatment well    Behavior During Therapy [facility] for tasks assessed/performed               Past Medical History:  Diagnosis Date   Anemia    Anxiety 2020   Arthritis    Asthma 2013-2014   Depression    Dyspnea 2013   Fibromyalgia 09/04/2014   GERD (gastroesophageal reflux disease)    Headache    History of hiatal hernia    Hypertension    Lumbar radicular pain 09/04/2014   Pre-diabetes    Sacroiliac joint pain    nonunion of left SI joint   Sleep apnea 2016   Spinal headache    trouble waking    TIA (transient ischemic attack)    Wears glasses    Past Surgical History:  Procedure Laterality Date   ABDOMINAL HYSTERECTOMY     BACK SURGERY  2002   BREAST LUMPECTOMY Left 1985   BREAST SURGERY     BUNIONECTOMY     COLONOSCOPY     DILATION AND CURETTAGE OF UTERUS     ESOPHAGEAL MANOMETRY [name] 11/21/2013   Procedure: ESOPHAGEAL MANOMETRY (EM);  Surgeon: Ashley Ng, MD;  Location: [facility];  Service: Endoscopy;  Laterality: [name];   KNEE ARTHROSCOPY W/ MENISCAL REPAIR     LUMBAR LAMINECTOMY     MULTIPLE TOOTH EXTRACTIONS     NASAL SINUS SURGERY     PAROTIDECTOMY Left 09/21/2020   Procedure: LEFT SUPERFICIAL PAROTIDECTOMY WITH NERVE MONITORING;  Surgeon: Ashley Belfast, MD;  Location: [place];  Service: ENT;  Laterality: Left;   SACROILIAC JOINT FUSION Left 08/05/2018   Procedure: REVISION LEFT SACROILIAC JOINT FUSION WITH INSTRUMENTATION AND ALLOGRAFT;  Surgeon: Ashley Bob, MD;  Location: [place];  Service:  Orthopedics;  Laterality: Left;   TONSILLECTOMY     Patient Active Problem List   Diagnosis Date Noted   Mass of left parotid gland 09/21/2020   Parotid mass 09/21/2020   De Quervain's tenosynovitis 06/22/2019   Trigger middle finger of right hand 04/27/2019   Stiffness of finger joint, right 04/27/2019   Xerostomia 02/23/2019   Dry eyes 02/23/2019   Family history of diabetes mellitus 01/26/2019   Family history of ischemic heart disease (IHD) 01/26/2019   Family history of stroke 01/26/2019   Hiatal hernia 01/26/2019   Knee pain, left 01/26/2019   Sacroiliac joint dysfunction 08/05/2018   Spondylosis of lumbar region without myelopathy or radiculopathy 05/16/2016   Generalized headaches 04/21/2016   History of migraine 04/21/2016   Menopause 04/09/2016   Benign hypertension 04/09/2016   Depressive disorder 04/09/2016   Disorder of thyroid gland 04/09/2016   Dysphagia 04/09/2016   Chronic low back pain with left-sided sciatica 03/24/2016   Asthmatic bronchitis 09/06/2014   GERD (gastroesophageal reflux disease) 09/06/2014   Allergic rhinitis 09/06/2014   Lumbar radicular pain 09/04/2014   Fibromyalgia 09/04/2014   Back pain 06/01/2013   Cubital tunnel syndrome 06/01/2013   Muscle spasms of neck 06/01/2013  Myofascial pain 06/01/2013   Neuropathic pain 06/01/2013   Right wrist pain 06/01/2013   Shoulder pain 06/01/2013   Primary localized osteoarthrosis, lower leg 12/08/2012   Encounter for routine gynecological examination 11/16/2012   Borderline hypertension 06/16/2012   Schatzki's ring 05/21/2012   G6PD deficiency 04/14/2012   Lumbar post-laminectomy syndrome 08/26/2011   SOB (shortness of breath) 04/27/2009    PCP: None  REFERRING PROVIDER: Aura Dials, MD   REFERRING DIAG: Lumbago with sciatica, left side [M54.42], Other chronic pain [G89.29]  Rationale for Evaluation and Treatment Rehabilitation  THERAPY DIAG:  Other low back pain  Muscle  weakness (generalized)  Abnormal posture  Chronic left-sided low back pain with left-sided sciatica  ONSET DATE: February 2016.  SUBJECTIVE:                                                                                                                                                                                           SUBJECTIVE STATEMENT:     From Eval:  Ashley [name] presents to PT with chronic L lower back pain with sciaticia. It started in Feb 2016 when she fell on her butt and tore her SI joint. She got a fusion a few months later, which was repeated again a few years later. 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 She reports constant pain in her L lower back that radiates into her L LE. She got a spinal stimulator placed in June 2023 which has helped with her lower back pain, but not the leg pain. The only thing that helps reduce leg pain is opioids which she doesn't like to take frequently. She reports wearing a heel lift in her L LE due to scoliosis.   PERTINENT HISTORY:  SI joint fusion in 2019 and SI joint revision in 2020 ; Lumbar laminectomy and foraminatomy in 2002 Spinal cord stimulator in June 2023 Anxiety, Depression, Fibromyalgia, HTN, Pre Diabetic, TIA, Scoliosis.   PAIN:  Are you having pain? Yes: NPRS scale: 7/10  Pain location:  L lowback, posterior hip, L posterior thigh,to L lateral calf Pain description: tight, achy, tender Aggravating factors: Bending over, reaching OH, walking, standing, sitting.  Relieving factors: Opioids, rest, ice, heat, stimulator.   PRECAUTIONS: None  WEIGHT BEARING RESTRICTIONS: No  FALLS:  Has patient fallen in last 6 months? Yes. Number of falls 2, when stepping backwards onto a step, and when cleaning behind bedroom door and trying to move something out of the way. Both falls in posterior direction.   LIVING ENVIRONMENT: Lives with:  Lives with her mother who has dementia Lives in:  House/apartment Stairs: Yes: External: 3 steps; none and 8 in back with railings.  Has following equipment at home:  Electric scooter if long distance, walking stick.   OCCUPATION: Disability due to scoring 30% on functional capacity evaluation.   PLOF: Independent  PATIENT GOALS: Pt would like to reduce her pain and be more functional.    OBJECTIVE:   DIAGNOSTIC FINDINGS:  None.    OUTPATIENT PHYSICAL THERAPY THORACOLUMBAR TREATMENT     Patient Name: Ashley [name] MRN: [id number] DOB:01-08-1956, 66 y.o., female Today's Date: 09/02/2022     PT End of Session - 09/02/22 1536       Visit Number 3     Number of Visits 18     Date for PT Re-Evaluation 10/28/22     Authorization Type BC[employer]ERAL E[employer] PPO     PT Start Time 1533     PT Stop Time 1615     PT Time Calculation (min) 42 min     Behavior During Therapy [facility] for tasks assessed/performed                       Past Medical History:  Diagnosis Date   Anemia     Anxiety 2020   Arthritis     Asthma 2013-2014   Depression     Dyspnea 2013   Fibromyalgia 09/04/2014   GERD (gastroesophageal reflux disease)     Headache     History of hiatal hernia     Hypertension     Lumbar radicular pain 09/04/2014   Pre-diabetes     Sacroiliac joint pain      nonunion of left SI joint   Sleep apnea 2016   Spinal headache      trouble waking    TIA (transient ischemic attack)     Wears glasses           Past Surgical History:  Procedure Laterality Date   ABDOMINAL HYSTERECTOMY       BACK SURGERY   2002   BREAST LUMPECTOMY Left 1985   BREAST SURGERY       BUNIONECTOMY       COLONOSCOPY       DILATION AND CURETTAGE OF UTERUS       ESOPHAGEAL MANOMETRY [name] 11/21/2013    Procedure: ESOPHAGEAL MANOMETRY (EM);  Surgeon: Ashley Ng, MD;  Location: [facility];  Service: Endoscopy;  Laterality: [name];   KNEE ARTHROSCOPY W/ MENISCAL REPAIR       LUMBAR LAMINECTOMY       MULTIPLE TOOTH EXTRACTIONS        NASAL SINUS SURGERY       PAROTIDECTOMY Left 09/21/2020    Procedure: LEFT SUPERFICIAL PAROTIDECTOMY WITH NERVE MONITORING;  Surgeon: Ashley Belfast, MD;  Location: [place];  Service: ENT;  Laterality: Left;   SACROILIAC JOINT FUSION Left 08/05/2018    Procedure: REVISION LEFT SACROILIAC JOINT FUSION WITH INSTRUMENTATION AND ALLOGRAFT;  Surgeon: Ashley Bob, MD;  Location: [place];  Service: Orthopedics;  Laterality: Left;   TONSILLECTOMY            Patient Active Problem List    Diagnosis Date Noted   Mass of left parotid gland 09/21/2020   Parotid mass 09/21/2020   De Quervain's tenosynovitis 06/22/2019   Trigger middle finger of right hand 04/27/2019   Stiffness of finger joint, right 04/27/2019   Xerostomia 02/23/2019   Dry eyes 02/23/2019   Family history of diabetes mellitus 01/26/2019   Family history  of ischemic heart disease (IHD) 01/26/2019   Family history of stroke 01/26/2019   Hiatal hernia 01/26/2019   Knee pain, left 01/26/2019   Sacroiliac joint dysfunction 08/05/2018   Spondylosis of lumbar region without myelopathy or radiculopathy 05/16/2016   Generalized headaches 04/21/2016   History of migraine 04/21/2016   Menopause 04/09/2016   Benign hypertension 04/09/2016   Depressive disorder 04/09/2016   Disorder of thyroid gland 04/09/2016   Dysphagia 04/09/2016   Chronic low back pain with left-sided sciatica 03/24/2016   Asthmatic bronchitis 09/06/2014   GERD (gastroesophageal reflux disease) 09/06/2014   Allergic rhinitis 09/06/2014   Lumbar radicular pain 09/04/2014   Fibromyalgia 09/04/2014   Back pain 06/01/2013   Cubital tunnel syndrome 06/01/2013   Muscle spasms of neck 06/01/2013   Myofascial pain 06/01/2013   Neuropathic pain 06/01/2013   Right wrist pain 06/01/2013   Shoulder pain 06/01/2013   Primary localized osteoarthrosis, lower leg 12/08/2012   Encounter for routine gynecological examination 11/16/2012   Borderline hypertension 06/16/2012    Schatzki's ring 05/21/2012   G6PD deficiency 04/14/2012   Lumbar post-laminectomy syndrome 08/26/2011   SOB (shortness of breath) 04/27/2009      PCP: None   REFERRING PROVIDER: Aura Dials, MD     REFERRING DIAG: Lumbago with sciatica, left side [M54.42], Other chronic pain [G89.29]   Rationale for Evaluation and Treatment Rehabilitation   THERAPY DIAG:  Other low back pain   Chronic left-sided low back pain with left-sided sciatica   Muscle weakness (generalized)   Abnormal posture   ONSET DATE: February 2016.   SUBJECTIVE:                                                                                                                                                                                            SUBJECTIVE STATEMENT: Pt reports she can sit longer than 15 min, she can go to more than one grocery store now without leaning on cart, and she can stand longer.   Pain has decreased by 25%.         From Eval:  Serenitee presents to PT with chronic L lower back pain with sciaticia. It started in Feb 2016 when she fell on her butt and tore her SI joint. She got a fusion a few months later, which was repeated again a few years later. 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 She reports constant pain in her L lower back that radiates into her L LE. She got a spinal stimulator placed in June 2023 which has helped with her lower back pain, but  not the leg pain. The only thing that helps reduce leg pain is opioids which she doesn't like to take frequently. She reports wearing a heel lift in her L LE due to scoliosis.    PERTINENT HISTORY:  Anxiety, Depression, Fibromyalgia, HTN, Pre Diabetic, TIA, Scoliosis.    PAIN:  Are you having pain? Yes: NPRS scale: 8/10 Pain location: back and into Entire L LE into last 3 toes  Pain description: Sharp pain, burning. Aggravating factors: Bending over, reaching OH, walking, standing, sitting.   Relieving factors: Opioids, rest, ice, heat, stimulator.    PRECAUTIONS: None   WEIGHT BEARING RESTRICTIONS: No   FALLS:  Has patient fallen in last 6 months? Yes. Number of falls 2, when stepping backwards onto a step, and when cleaning behind bedroom door and trying to move something out of the way. Both falls in posterior direction.    LIVING ENVIRONMENT: Lives with:  Lives with her mother who has dementia Lives in: House/apartment Stairs: Yes: External: 3 steps; none and 8 in back with railings.  Has following equipment at home:  Electric scooter if long distance, walking stick.    OCCUPATION: Disability due to scoring 30% on functional capacity evaluation.    PLOF: Independent   PATIENT GOALS: Pt would like to reduce her pain and be more functional.      OBJECTIVE:    DIAGNOSTIC FINDINGS:  None.    PATIENT SURVEYS:  FOTO 40.76%, 45% in 14 vitis.    SCREENING FOR RED FLAGS: Bowel or bladder incontinence: No   COGNITION: Overall cognitive status: Within functional limits for tasks assessed                          SENSATION: WFL     POSTURE: rounded shoulders, forward head, and increased lumbar lordosis   PALPATION: Entire L side of QL, Paraspinals and into glutes   LUMBAR ROM:    AROM eval  Flexion WFL, increases symptoms  Extension WFL  Right lateral flexion WFL  Left lateral flexion WFL - increases symptoms.   Right rotation WFL  Left rotation WFL   (Blank rows = not tested)   LOWER EXTREMITY ROM:      Active  Right eval Left eval  Hip flexion [facility] WFL, increases symptoms  Hip extension      Hip abduction      Knee flexion [facility] WFL  Knee extension [facility] WFL, increases symptoms   (Blank rows = not tested)   LOWER EXTREMITY MMT:     MMT Right eval Left eval  Hip flexion 5+ 4 limited by pain  Hip extension      Hip abduction      Knee flexion 5 4 limited by pain  Knee extension 5 4 limited by pain   (Blank rows = not tested)   LUMBAR  SPECIAL TESTS:  Slump test: Positive   FUNCTIONAL TESTS:  5 times sit to stand: 19.97 seconds.  Timed up and go (TUG): 12.01 sec   GAIT: Distance walked: 50 ft Assistive device utilized: None Level of assistance: Complete Independence Comments: Pt walks with decreased step length and decreased weight on L LE.    TODAY'S TREATMENT:                                    DATE: 09/17/2022 Pt seen for aquatic therapy today.  Treatment took place  in water 3.25-4.75 ft in depth at the [facility] pool. Temp of water was 91.  Pt entered/exited the pool via stairs independently with bilat rail.     * in 4+ ft of water: unsupported walking forward/ backward / side stepping / high knee marching forward/ backward; walking forward kick * TrA set with thin square noodle x 15;   *  light lumbar rotation with hands resting on blue noodle x 5 each way (improved range) * holding wall: hip circles CW/CCW 5 each direction, each LE; heel raises BLE x 10, SLE x 10 each;    split squats x 10 each; single leg clam x 10 x 2 * light jog * straddling yellow noodle:  cycling with doggy paddle arms, jumping jack legs and cc ski with feet gently touching ground * back against wall: using thin square noodle as strap- ITB, hamstring and adductor stretch each LE           PATIENT EDUCATION:  Education details:relevant anatomy, diagnosis, prognosis, HEP, exercise form and rationale, and POC. Person educated: Patient Education method: Customer service manager, verbal cues Education comprehension: verbalized understanding and returned demonstration, verbal cues required  HOME EXERCISE PROGRAM: Pt  is already performing LTR, bridges, supine HS stretch, and Piriformis stretch after reviewing. She denied a handout.  Access Code: [email or web address] URL: [email or web address] Date: 09/10/2022 Prepared by: Ronny Flurry  Exercises - Supine Transversus Abdominis Bracing - Hands on Stomach  - 2 x  daily - 7 x weekly - 2 sets - 10 reps - 5 seconds hold - Supine March  - 1-2 x daily - 7 x weekly - 2 sets - 10 reps - Hooklying Clamshell with Resistance  - 1 x daily - 4 x weekly - 2 sets - 10 reps   ASSESSMENT:  CLINICAL IMPRESSION:    Pt reported centralization of symptoms and reduction of pain in Low back and Lt hip to 3/10 during session.  Tolerated all exercises well.  Reported "pinching" in Lt ant hip with split squats, but resolved with repetition. Pt has met STG #2. Pt should cont to benefit from cont skilled PT services to address ongoing goals and to improve overall function.    OBJECTIVE IMPAIRMENTS: decreased activity tolerance, difficulty walking, decreased balance, decreased endurance, decreased mobility, decreased ROM, decreased strength, impaired flexibility, impaired UE/LE use, postural dysfunction, and pain.  ACTIVITY LIMITATIONS: bending, lifting, carry, locomotion, cleaning, community activity, driving, and or occupation  PERSONAL FACTORS: Anxiety, Depression, Fibromyalgia, HTN, Pre Diabetic, TIA are also affecting patient's functional outcome.  REHAB POTENTIAL: Good  CLINICAL DECISION MAKING: Stable/uncomplicated  EVALUATION COMPLEXITY: Low    GOALS: Short term PT Goals Target date: 09/23/22 Pt will be I and compliant with HEP. Baseline:  Goal status:Ongoing Pt will decrease pain by 25% overall Baseline: Goal status: Achieved - 09/17/22  Long term PT goals Target date: 10/21/22 Pt will improve ROM to [facility] to improve functional mobility Baseline: Goal status: New Pt will improve  hip/knee strength to at least 5-/5 MMT to improve functional strength Baseline: Goal status: New Pt will improve FOTO to at least 45% functional to show improved function Baseline: Goal status: New Pt will reduce pain by overall 50% overall with usual activity Baseline: Goal status: New Pt will reduce pain to overall less than 2-3/10 with usual activity and work  activity. Baseline: Goal status: New Pt will be able to ambulate community distances at least 1000 ft WNL gait pattern without complaints Baseline: Goal status: New  7. Pt will improve her STS by 2.3 seconds for Minimal clinically important difference.         Baseline:         Goal status: New       8. Pt will improve TUG by 3.4 seconds or Minimal clinically important difference.        Baseline:        Goal status: New  PLAN: PT FREQUENCY: 1-3 times per week   PT DURATION: 6-8 weeks  PLANNED INTERVENTIONS (unless contraindicated): aquatic PT, Canalith repositioning, cryotherapy, Electrical stimulation, Iontophoresis with 4 mg/ml dexamethasome, Moist heat, traction, Ultrasound, gait training, Therapeutic exercise, balance training, neuromuscular re-education, patient/family education, prosthetic training, manual techniques, passive ROM, dry needling, taping, vasopnuematic device, vestibular, spinal manipulations, joint manipulations  PLAN FOR NEXT SESSION:  continue to progress functional mobility, strengthen L LE, TrA, and glutes muscles. Decrease patients pain and help minimize functional deficits. Pt may seek referral for Lt shoulder (plans to see ortho Dr to assess).  Cont with STW and core strengthening.   Cont with supine manual HS stretch with passive AP's.   Kerin Perna, PTA 09/17/22 12:54 PM [facility] [address], [place], [address] Phone: [phone]   Fax:  [phone]

## 2022-09-19 DIAGNOSIS — E785 Hyperlipidemia, unspecified: Secondary | ICD-10-CM | POA: Diagnosis not present

## 2022-09-19 DIAGNOSIS — F4323 Adjustment disorder with mixed anxiety and depressed mood: Secondary | ICD-10-CM | POA: Diagnosis not present

## 2022-09-19 DIAGNOSIS — D649 Anemia, unspecified: Secondary | ICD-10-CM | POA: Diagnosis not present

## 2022-09-19 DIAGNOSIS — I10 Essential (primary) hypertension: Secondary | ICD-10-CM | POA: Diagnosis not present

## 2022-09-19 DIAGNOSIS — R7303 Prediabetes: Secondary | ICD-10-CM | POA: Diagnosis not present

## 2022-09-22 ENCOUNTER — Other Ambulatory Visit: Payer: Self-pay | Admitting: Orthopedic Surgery

## 2022-09-22 DIAGNOSIS — M6530 Trigger finger, unspecified finger: Secondary | ICD-10-CM | POA: Diagnosis not present

## 2022-09-23 ENCOUNTER — Ambulatory Visit (HOSPITAL_BASED_OUTPATIENT_CLINIC_OR_DEPARTMENT_OTHER): Payer: Federal, State, Local not specified - PPO | Admitting: Physical Therapy

## 2022-10-01 ENCOUNTER — Ambulatory Visit
Admission: RE | Admit: 2022-10-01 | Discharge: 2022-10-01 | Disposition: A | Discharge status: Home / Self Care | Payer: Federal, State, Local not specified - PPO | Source: Ambulatory Visit | Attending: Pain Medicine | Admitting: Pain Medicine

## 2022-10-01 ENCOUNTER — Other Ambulatory Visit: Payer: Self-pay | Admitting: Pain Medicine

## 2022-10-01 DIAGNOSIS — M19012 Primary osteoarthritis, left shoulder: Secondary | ICD-10-CM | POA: Diagnosis not present

## 2022-10-01 DIAGNOSIS — G8929 Other chronic pain: Secondary | ICD-10-CM

## 2022-10-01 DIAGNOSIS — M25512 Pain in left shoulder: Secondary | ICD-10-CM

## 2022-10-01 DIAGNOSIS — M19011 Primary osteoarthritis, right shoulder: Secondary | ICD-10-CM | POA: Diagnosis not present

## 2022-10-06 ENCOUNTER — Ambulatory Visit (HOSPITAL_BASED_OUTPATIENT_CLINIC_OR_DEPARTMENT_OTHER): Payer: Federal, State, Local not specified - PPO | Attending: Neurosurgery | Admitting: Physical Therapy

## 2022-10-06 DIAGNOSIS — M5459 Other low back pain: Secondary | ICD-10-CM | POA: Diagnosis not present

## 2022-10-06 DIAGNOSIS — M6281 Muscle weakness (generalized): Secondary | ICD-10-CM | POA: Insufficient documentation

## 2022-10-06 DIAGNOSIS — R2689 Other abnormalities of gait and mobility: Secondary | ICD-10-CM | POA: Insufficient documentation

## 2022-10-06 DIAGNOSIS — M5442 Lumbago with sciatica, left side: Secondary | ICD-10-CM | POA: Diagnosis not present

## 2022-10-06 DIAGNOSIS — R293 Abnormal posture: Secondary | ICD-10-CM | POA: Insufficient documentation

## 2022-10-06 DIAGNOSIS — G8929 Other chronic pain: Secondary | ICD-10-CM | POA: Diagnosis not present

## 2022-10-06 NOTE — Therapy (Signed)
OUTPATIENT PHYSICAL THERAPY THORACOLUMBAR TREATMENT   Patient Name: Ashley [name] MRN: [id number] DOB:[birth year], 66 y.o., female Today's Date: 10/07/2022   PT End of Session - 10/06/22 1415     Visit Number 7    Number of Visits 18    Date for PT Re-Evaluation 10/28/22    Authorization Type BCBS/[employer] PPO    PT Start Time 1354    PT Stop Time 1436    PT Time Calculation (min) 42 min    Activity Tolerance Patient tolerated treatment well    Behavior During Therapy [facility] for tasks assessed/performed                Past Medical History:  Diagnosis Date   Anemia    Anxiety 2020   Arthritis    Asthma 2013-2014   Depression    Dyspnea 2013   Fibromyalgia 09/04/2014   GERD (gastroesophageal reflux disease)    Headache    History of hiatal hernia    Hypertension    Lumbar radicular pain 09/04/2014   Pre-diabetes    Sacroiliac joint pain    nonunion of left SI joint   Sleep apnea 2016   Spinal headache    trouble waking    TIA (transient ischemic attack)    Wears glasses    Past Surgical History:  Procedure Laterality Date   ABDOMINAL HYSTERECTOMY     BACK SURGERY  2002   BREAST LUMPECTOMY Left 1985   BREAST SURGERY     BUNIONECTOMY     COLONOSCOPY     DILATION AND CURETTAGE OF UTERUS     ESOPHAGEAL MANOMETRY [name] 11/21/2013   Procedure: ESOPHAGEAL MANOMETRY (EM);  Surgeon: Shirley Friar, MD;  Location: [facility];  Service: Endoscopy;  Laterality: [name];   KNEE ARTHROSCOPY W/ MENISCAL REPAIR     LUMBAR LAMINECTOMY     MULTIPLE TOOTH EXTRACTIONS     NASAL SINUS SURGERY     PAROTIDECTOMY Left 09/21/2020   Procedure: LEFT SUPERFICIAL PAROTIDECTOMY WITH NERVE MONITORING;  Surgeon: Osborn Coho, MD;  Location: [facility] OR;  Service: ENT;  Laterality: Left;   SACROILIAC JOINT FUSION Left 08/05/2018   Procedure: REVISION LEFT SACROILIAC JOINT FUSION WITH INSTRUMENTATION AND ALLOGRAFT;  Surgeon: Estill Bamberg, MD;  Location: [facility] OR;  Service:  Orthopedics;  Laterality: Left;   TONSILLECTOMY     Patient Active Problem List   Diagnosis Date Noted   Mass of left parotid gland 09/21/2020   Parotid mass 09/21/2020   De Quervain's tenosynovitis 06/22/2019   Trigger middle finger of right hand 04/27/2019   Stiffness of finger joint, right 04/27/2019   Xerostomia 02/23/2019   Dry eyes 02/23/2019   Family history of diabetes mellitus 01/26/2019   Family history of ischemic heart disease (IHD) 01/26/2019   Family history of stroke 01/26/2019   Hiatal hernia 01/26/2019   Knee pain, left 01/26/2019   Sacroiliac joint dysfunction 08/05/2018   Spondylosis of lumbar region without myelopathy or radiculopathy 05/16/2016   Generalized headaches 04/21/2016   History of migraine 04/21/2016   Menopause 04/09/2016   Benign hypertension 04/09/2016   Depressive disorder 04/09/2016   Disorder of thyroid gland 04/09/2016   Dysphagia 04/09/2016   Chronic low back pain with left-sided sciatica 03/24/2016   Asthmatic bronchitis 09/06/2014   GERD (gastroesophageal reflux disease) 09/06/2014   Allergic rhinitis 09/06/2014   Lumbar radicular pain 09/04/2014   Fibromyalgia 09/04/2014   Back pain 06/01/2013   Cubital tunnel syndrome 06/01/2013   Muscle spasms of neck  06/01/2013   Myofascial pain 06/01/2013   Neuropathic pain 06/01/2013   Right wrist pain 06/01/2013   Shoulder pain 06/01/2013   Primary localized osteoarthrosis, lower leg 12/08/2012   Encounter for routine gynecological examination 11/16/2012   Borderline hypertension 06/16/2012   Schatzki's ring 05/21/2012   G6PD deficiency 04/14/2012   Lumbar post-laminectomy syndrome 08/26/2011   SOB (shortness of breath) 04/27/2009    PCP: None  REFERRING PROVIDER: Henrine Screwshacker, Robert, MD   REFERRING DIAG: Lumbago with sciatica, left side [M54.42], Other chronic pain [G89.29]  Rationale for Evaluation and Treatment Rehabilitation  THERAPY DIAG:  Other low back pain  Muscle  weakness (generalized)  Abnormal posture  ONSET DATE: February 2016.  SUBJECTIVE:                                                                                                                                                                                           SUBJECTIVE STATEMENT: Pt can't remember how she felt after prior land based Rx, but always feels better after the pool exercises.  Pt states she has been performing her home exercises which helps.  Pt has been performing seated nn flossing with AP's at home a lot lately.  She reports improved sx's with seated nn flossing exercise.  Pt states she is hurting today down the entire leg    From Eval:  Ashley [name] presents to PT with chronic L lower back pain with sciaticia. It started in Feb 2016 when she fell on her butt and tore her SI joint. She got a fusion a few months later, which was repeated again a few years later. 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 She reports constant pain in her L lower back that radiates into her L LE. She got a spinal stimulator placed in June 2023 which has helped with her lower back pain, but not the leg pain. The only thing that helps reduce leg pain is opioids which she doesn't like to take frequently. She reports wearing a heel lift in her L LE due to scoliosis.   PERTINENT HISTORY:  SI joint fusion in 2019 and SI joint revision in 2020 ; Lumbar laminectomy and foraminatomy in 2002 Spinal cord stimulator in June 2023 Anxiety, Depression, Fibromyalgia, HTN, Pre Diabetic, TIA, Scoliosis.   PAIN:  Are you having pain? Yes: NPRS scale: 8/10  Pain location:  L posterior hip, L posterior thigh, L posterior knee, L sided lumbar   /  R hip Pain description: Sharp pain, burning. Aggravating factors: Bending over, reaching OH, walking, standing, sitting.  Relieving factors: Opioids, rest, ice, heat,  stimulator.   PRECAUTIONS: None  WEIGHT BEARING RESTRICTIONS:  No  FALLS:  Has patient fallen in last 6 months? Yes. Number of falls 2, when stepping backwards onto a step, and when cleaning behind bedroom door and trying to move something out of the way. Both falls in posterior direction.   LIVING ENVIRONMENT: Lives with:  Lives with her mother who has dementia Lives in: House/apartment Stairs: Yes: External: 3 steps; none and 8 in back with railings.  Has following equipment at home:  Electric scooter if long distance, walking stick.   OCCUPATION: Disability due to scoring 30% on functional capacity evaluation.   PLOF: Independent  PATIENT GOALS: Pt would like to reduce her pain and be more functional.    OBJECTIVE:   DIAGNOSTIC FINDINGS:  None.     TODAY'S TREATMENT:  Therapeutic Exercise: Reviewed HEP compliance, pain level, and response to prior Rx. Supine TrA with marching 2x10 Supine alt UE/LE 2x10 reps Supine clams with TrA with RTB 2x10 Supine alt LE extension with TrA x10 and x 10-15 while holding ball at 90 deg shoulder flexion Supine shoulder flex and ext with TrA 2x10  Supine SLR x 7 reps on L LE, 2x10 R LE with TrA  Manual Therapy: Pt received STM to L sided lumbar paraspinals and L glute to improve soft tissue mobility, tightness, and pain in R S/L'ing with pillow b/w knees. Pt received L HS stretch with passive AP's for nn flossing 3x30 sec on.    PATIENT EDUCATION:  Education details:relevant anatomy, diagnosis, prognosis, HEP, exercise form and rationale, and POC. Person educated: Patient Education method: Medical illustrator, verbal cues Education comprehension: verbalized understanding and returned demonstration, verbal cues required  HOME EXERCISE PROGRAM: Pt  is already performing LTR, bridges, supine HS stretch, and Piriformis stretch after reviewing. She denied a handout.  Access Code: [email or web address] URL: [email or web address] Date: 09/10/2022 Prepared by: Aaron Edelman  Exercises - Supine Transversus Abdominis Bracing - Hands on Stomach  - 2 x daily - 7 x weekly - 2 sets - 10 reps - 5 seconds hold - Supine March  - 1-2 x daily - 7 x weekly - 2 sets - 10 reps - Hooklying Clamshell with Resistance  - 1 x daily - 4 x weekly - 2 sets - 10 reps   ASSESSMENT:  CLINICAL IMPRESSION:  Pt presents to Rx with continued high levels of pain.  She has been perform neural sensitizing maneuvers at home and reports improved sx's.  PT progressed core exercises today and Pt performed exercises well.  She has areas of tenderness and trigger points in L glute.  Pt responded well to Rx stating she felt a little better after Rx.  Pt reports she has improved in her foot.  Pt should cont to benefit from cont skilled PT services to address ongoing goals and to improve overall function.    OBJECTIVE IMPAIRMENTS: decreased activity tolerance, difficulty walking, decreased balance, decreased endurance, decreased mobility, decreased ROM, decreased strength, impaired flexibility, impaired UE/LE use, postural dysfunction, and pain.  ACTIVITY LIMITATIONS: bending, lifting, carry, locomotion, cleaning, community activity, driving, and or occupation  PERSONAL FACTORS: Anxiety, Depression, Fibromyalgia, HTN, Pre Diabetic, TIA are also affecting patient's functional outcome.  REHAB POTENTIAL: Good  CLINICAL DECISION MAKING: Stable/uncomplicated  EVALUATION COMPLEXITY: Low    GOALS: Short term PT Goals Target date: 09/23/22 Pt will be I and compliant with HEP. Baseline:  Goal status: New Pt will decrease pain by 25% overall Baseline: Goal status: New  Long term PT goals Target date: 10/21/22 Pt will improve ROM to [facility] to improve functional mobility Baseline: Goal status: New Pt will improve  hip/knee strength to at least 5-/5 MMT to improve functional strength Baseline: Goal status: New Pt will improve FOTO to at least 45% functional to show improved  function Baseline: Goal status: New Pt will reduce pain by overall 50% overall with usual activity Baseline: Goal status: New Pt will reduce pain to overall less than 2-3/10 with usual activity and work activity. Baseline: Goal status: New Pt will be able to ambulate community distances at least 1000 ft WNL gait pattern without complaints Baseline: Goal status: New      7. Pt will improve her STS by 2.3 seconds for Minimal clinically important difference.         Baseline:         Goal status: New       8. Pt will improve TUG by 3.4 seconds or Minimal clinically important difference.        Baseline:        Goal status: New  PLAN: PT FREQUENCY: 1-3 times per week   PT DURATION: 6-8 weeks  PLANNED INTERVENTIONS (unless contraindicated): aquatic PT, Canalith repositioning, cryotherapy, Electrical stimulation, Iontophoresis with 4 mg/ml dexamethasome, Moist heat, traction, Ultrasound, gait training, Therapeutic exercise, balance training, neuromuscular re-education, patient/family education, prosthetic training, manual techniques, passive ROM, dry needling, taping, vasopnuematic device, vestibular, spinal manipulations, joint manipulations  PLAN FOR NEXT SESSION:  continue to progress functional mobility, strengthen L LE, TrA, and glutes muscles. Decrease patients pain and help minimize functional deficits. Pt may seek referral for Lt shoulder (plans to see ortho Dr to assess).  Cont with STW and core strengthening.   Cont with supine manual HS stretch with passive AP's.    Audie Clear III PT, DPT 10/07/22 9:41 PM

## 2022-10-07 ENCOUNTER — Encounter (HOSPITAL_BASED_OUTPATIENT_CLINIC_OR_DEPARTMENT_OTHER): Payer: Self-pay | Admitting: Physical Therapy

## 2022-10-08 ENCOUNTER — Ambulatory Visit (HOSPITAL_BASED_OUTPATIENT_CLINIC_OR_DEPARTMENT_OTHER): Payer: Federal, State, Local not specified - PPO | Admitting: Physical Therapy

## 2022-10-08 ENCOUNTER — Encounter (HOSPITAL_BASED_OUTPATIENT_CLINIC_OR_DEPARTMENT_OTHER): Payer: Self-pay | Admitting: Physical Therapy

## 2022-10-08 DIAGNOSIS — M5442 Lumbago with sciatica, left side: Secondary | ICD-10-CM | POA: Diagnosis not present

## 2022-10-08 DIAGNOSIS — M5459 Other low back pain: Secondary | ICD-10-CM | POA: Diagnosis not present

## 2022-10-08 DIAGNOSIS — G8929 Other chronic pain: Secondary | ICD-10-CM

## 2022-10-08 DIAGNOSIS — M6281 Muscle weakness (generalized): Secondary | ICD-10-CM | POA: Diagnosis not present

## 2022-10-08 DIAGNOSIS — R2689 Other abnormalities of gait and mobility: Secondary | ICD-10-CM

## 2022-10-08 DIAGNOSIS — R293 Abnormal posture: Secondary | ICD-10-CM | POA: Diagnosis not present

## 2022-10-08 NOTE — Therapy (Signed)
OUTPATIENT PHYSICAL THERAPY THORACOLUMBAR TREATMENT   Patient Name: Ashley [name] MRN: [id number] DOB:[birth year], 66 y.o., female Today's Date: 10/08/2022   PT End of Session - 10/08/22 1420     Visit Number 8    Number of Visits 18    Date for PT Re-Evaluation 10/28/22    Authorization Type [employer]BS/[employer] PPO    PT Start Time 1418    PT Stop Time 1500    PT Time Calculation (min) 42 min    Activity Tolerance Patient tolerated treatment well    Behavior During Therapy [facility] for tasks assessed/performed                Past Medical History:  Diagnosis Date   Anemia    Anxiety 2020   Arthritis    Asthma 2013-2014   Depression    Dyspnea 2013   Fibromyalgia 09/04/2014   GERD (gastroesophageal reflux disease)    Headache    History of hiatal hernia    Hypertension    Lumbar radicular pain 09/04/2014   Pre-diabetes    Sacroiliac joint pain    nonunion of left SI joint   Sleep apnea 2016   Spinal headache    trouble waking    TIA (transient ischemic attack)    Wears glasses    Past Surgical History:  Procedure Laterality Date   ABDOMINAL HYSTERECTOMY     BACK SURGERY  2002   BREAST LUMPECTOMY Left 1985   BREAST SURGERY     BUNIONECTOMY     COLONOSCOPY     DILATION AND CURETTAGE OF UTERUS     ESOPHAGEAL MANOMETRY [name] 11/21/2013   Procedure: ESOPHAGEAL MANOMETRY (EM);  Surgeon: Shirley Friar, MD;  Location: [facility];  Service: Endoscopy;  Laterality: [name];   KNEE ARTHROSCOPY W/ MENISCAL REPAIR     LUMBAR LAMINECTOMY     MULTIPLE TOOTH EXTRACTIONS     NASAL SINUS SURGERY     PAROTIDECTOMY Left 09/21/2020   Procedure: LEFT SUPERFICIAL PAROTIDECTOMY WITH NERVE MONITORING;  Surgeon: Osborn Coho, MD;  Location: [facility] OR;  Service: ENT;  Laterality: Left;   SACROILIAC JOINT FUSION Left 08/05/2018   Procedure: REVISION LEFT SACROILIAC JOINT FUSION WITH INSTRUMENTATION AND ALLOGRAFT;  Surgeon: Estill Bamberg, MD;  Location: [facility] OR;  Service:  Orthopedics;  Laterality: Left;   TONSILLECTOMY     Patient Active Problem List   Diagnosis Date Noted   Mass of left parotid gland 09/21/2020   Parotid mass 09/21/2020   De Quervain's tenosynovitis 06/22/2019   Trigger middle finger of right hand 04/27/2019   Stiffness of finger joint, right 04/27/2019   Xerostomia 02/23/2019   Dry eyes 02/23/2019   Family history of diabetes mellitus 01/26/2019   Family history of ischemic heart disease (IHD) 01/26/2019   Family history of stroke 01/26/2019   Hiatal hernia 01/26/2019   Knee pain, left 01/26/2019   Sacroiliac joint dysfunction 08/05/2018   Spondylosis of lumbar region without myelopathy or radiculopathy 05/16/2016   Generalized headaches 04/21/2016   History of migraine 04/21/2016   Menopause 04/09/2016   Benign hypertension 04/09/2016   Depressive disorder 04/09/2016   Disorder of thyroid gland 04/09/2016   Dysphagia 04/09/2016   Chronic low back pain with left-sided sciatica 03/24/2016   Asthmatic bronchitis 09/06/2014   GERD (gastroesophageal reflux disease) 09/06/2014   Allergic rhinitis 09/06/2014   Lumbar radicular pain 09/04/2014   Fibromyalgia 09/04/2014   Back pain 06/01/2013   Cubital tunnel syndrome 06/01/2013   Muscle spasms of neck  06/01/2013   Myofascial pain 06/01/2013   Neuropathic pain 06/01/2013   Right wrist pain 06/01/2013   Shoulder pain 06/01/2013   Primary localized osteoarthrosis, lower leg 12/08/2012   Encounter for routine gynecological examination 11/16/2012   Borderline hypertension 06/16/2012   Schatzki's ring 05/21/2012   G6PD deficiency 04/14/2012   Lumbar post-laminectomy syndrome 08/26/2011   SOB (shortness of breath) 04/27/2009    PCP: None  REFERRING PROVIDER: Henrine Screws, MD   REFERRING DIAG: Lumbago with sciatica, left side [M54.42], Other chronic pain [G89.29]  Rationale for Evaluation and Treatment Rehabilitation  THERAPY DIAG:  Other low back pain  Muscle  weakness (generalized)  Abnormal posture  Chronic left-sided low back pain with left-sided sciatica  Other abnormalities of gait and mobility  ONSET DATE: February 2016.  SUBJECTIVE:                                                                                                                                                                                           SUBJECTIVE STATEMENT: Missed a while but doing ok.  Felt better after last session on land.    From Eval:  Ashley [name] presents to PT with chronic L lower back pain with sciaticia. It started in Feb 2016 when she fell on her butt and tore her SI joint. She got a fusion a few months later, which was repeated again a few years later. 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 She reports constant pain in her L lower back that radiates into her L LE. She got a spinal stimulator placed in June 2023 which has helped with her lower back pain, but not the leg pain. The only thing that helps reduce leg pain is opioids which she doesn't like to take frequently. She reports wearing a heel lift in her L LE due to scoliosis.   PERTINENT HISTORY:  SI joint fusion in 2019 and SI joint revision in 2020 ; Lumbar laminectomy and foraminatomy in 2002 Spinal cord stimulator in June 2023 Anxiety, Depression, Fibromyalgia, HTN, Pre Diabetic, TIA, Scoliosis.   PAIN:  Are you having pain? Yes: NPRS scale: 7/10  Pain location:  L posterior hip, L posterior thigh, L posterior knee, L sided lumbar   /  R hip Pain description: Sharp pain, burning. Aggravating factors: Bending over, reaching OH, walking, standing, sitting.  Relieving factors: Opioids, rest, ice, heat, stimulator.   PRECAUTIONS: None  WEIGHT BEARING RESTRICTIONS: No  FALLS:  Has patient fallen in last 6 months? Yes. Number of falls 2, when stepping backwards onto a step, and when cleaning behind bedroom door  and trying to move something out of the way.  Both falls in posterior direction.   LIVING ENVIRONMENT: Lives with:  Lives with her mother who has dementia Lives in: House/apartment Stairs: Yes: External: 3 steps; none and 8 in back with railings.  Has following equipment at home:  Electric scooter if long distance, walking stick.   OCCUPATION: Disability due to scoring 30% on functional capacity evaluation.   PLOF: Independent  PATIENT GOALS: Pt would like to reduce her pain and be more functional.    OBJECTIVE:   DIAGNOSTIC FINDINGS:  None.     TODAY'S TREATMENT:  Pt seen for aquatic therapy today.  Treatment took place in water 3.25-4.75 ft in depth at the [facility]. Temp of water was 91.  Pt entered/exited the pool via stairs independently with bilat rail.     * in 4+ ft of water: unsupported walking forward/ backward / side stepping / high knee marching forward/ backward; *Balance with core firing using hand bells LE staggered then together. Bilateral then unilateral hand beels. Oscillating in sagittal and frontal planes *L stretch with "tail wagging" *QL stretch *pelvic ROM seated on squoodle: ant/post pelvic tilts; hip hiking; rotation *ankle fin sagittal plane movement for left sided core engagement * straddling yellow noodle:  cycling with ue hand support  PATIENT EDUCATION:  Education details:relevant anatomy, diagnosis, prognosis, HEP, exercise form and rationale, and POC. Person educated: Patient Education method: Medical illustratorxplanation and Demonstration, verbal cues Education comprehension: verbalized understanding and returned demonstration, verbal cues required  HOME EXERCISE PROGRAM: Pt  is already performing LTR, bridges, supine HS stretch, and Piriformis stretch after reviewing. She denied a handout.  Access Code: [email or web address] URL: [email or web address] Date: 09/10/2022 Prepared by: Aaron Edelmanrey Harrison  Exercises - Supine Transversus Abdominis Bracing - Hands on Stomach  - 2 x daily -  7 x weekly - 2 sets - 10 reps - 5 seconds hold - Supine March  - 1-2 x daily - 7 x weekly - 2 sets - 10 reps - Hooklying Clamshell with Resistance  - 1 x daily - 4 x weekly - 2 sets - 10 reps   ASSESSMENT:  CLINICAL IMPRESSION:  Pt has centralizing of pain during session initially through calf then to thigh. Reports radicular symptoms are recreated with Side bending to left. Progressed core and LLE strengthening which she tolerates well. Reduction of pain from 7/10 to 4. She vu of importance of consistency with therapy for optimal outcomes.     OBJECTIVE IMPAIRMENTS: decreased activity tolerance, difficulty walking, decreased balance, decreased endurance, decreased mobility, decreased ROM, decreased strength, impaired flexibility, impaired UE/LE use, postural dysfunction, and pain.  ACTIVITY LIMITATIONS: bending, lifting, carry, locomotion, cleaning, community activity, driving, and or occupation  PERSONAL FACTORS: Anxiety, Depression, Fibromyalgia, HTN, Pre Diabetic, TIA are also affecting patient's functional outcome.  REHAB POTENTIAL: Good  CLINICAL DECISION MAKING: Stable/uncomplicated  EVALUATION COMPLEXITY: Low    GOALS: Short term PT Goals Target date: 09/23/22 Pt will be I and compliant with HEP. Baseline:  Goal status: New Pt will decrease pain by 25% overall Baseline: Goal status: New  Long term PT goals Target date: 10/21/22 [facility] functional [facility] Baseline: Goal status: New Pt will improve  hip/knee strength to at least 5-/5 MMT to improve functional strength Baseline: Goal status: New Pt will improve FOTO to at least 45% functional to show improved function Baseline: Goal status: New Pt will reduce pain by overall 50% overall with usual activity Baseline:  Goal status: New Pt will reduce pain to overall less than 2-3/10 with usual activity and work activity. Baseline: Goal status: New Pt will be able to ambulate  community distances at least 1000 ft WNL gait pattern without complaints Baseline: Goal status: New      7. Pt will improve her STS by 2.3 seconds for Minimal clinically important difference.         Baseline:         Goal status: New       8. Pt will improve TUG by 3.4 seconds or Minimal clinically important difference.        Baseline:        Goal status: New  PLAN: PT FREQUENCY: 1-3 times per week   PT DURATION: 6-8 weeks  PLANNED INTERVENTIONS (unless contraindicated): aquatic PT, Canalith repositioning, cryotherapy, Electrical stimulation, Iontophoresis with 4 mg/ml dexamethasome, Moist heat, traction, Ultrasound, gait training, Therapeutic exercise, balance training, neuromuscular re-education, patient/family education, prosthetic training, manual techniques, passive ROM, dry needling, taping, vasopnuematic device, vestibular, spinal manipulations, joint manipulations  PLAN FOR NEXT SESSION:  continue to progress functional mobility, strengthen L LE, TrA, and glutes muscles. Decrease patients pain and help minimize functional deficits. Pt may seek referral for Lt shoulder (plans to see ortho Dr to assess).  Cont with STW and core strengthening.   Cont with supine manual HS stretch with passive AP's.    Corrie Dandy Tomma Lightning) Catherene Kaleta MPT 10/08/22 305pm

## 2022-10-14 DIAGNOSIS — H04202 Unspecified epiphora, left lacrimal gland: Secondary | ICD-10-CM | POA: Diagnosis not present

## 2022-10-14 DIAGNOSIS — H02834 Dermatochalasis of left upper eyelid: Secondary | ICD-10-CM | POA: Diagnosis not present

## 2022-10-15 ENCOUNTER — Ambulatory Visit (HOSPITAL_BASED_OUTPATIENT_CLINIC_OR_DEPARTMENT_OTHER): Payer: Federal, State, Local not specified - PPO | Admitting: Physical Therapy

## 2022-10-16 ENCOUNTER — Encounter (HOSPITAL_BASED_OUTPATIENT_CLINIC_OR_DEPARTMENT_OTHER): Payer: Self-pay | Admitting: Physical Therapy

## 2022-10-16 ENCOUNTER — Ambulatory Visit (HOSPITAL_BASED_OUTPATIENT_CLINIC_OR_DEPARTMENT_OTHER): Payer: Federal, State, Local not specified - PPO | Admitting: Physical Therapy

## 2022-10-16 DIAGNOSIS — R293 Abnormal posture: Secondary | ICD-10-CM | POA: Diagnosis not present

## 2022-10-16 DIAGNOSIS — M5459 Other low back pain: Secondary | ICD-10-CM

## 2022-10-16 DIAGNOSIS — R2689 Other abnormalities of gait and mobility: Secondary | ICD-10-CM | POA: Diagnosis not present

## 2022-10-16 DIAGNOSIS — M6281 Muscle weakness (generalized): Secondary | ICD-10-CM

## 2022-10-16 DIAGNOSIS — G8929 Other chronic pain: Secondary | ICD-10-CM | POA: Diagnosis not present

## 2022-10-16 DIAGNOSIS — M5442 Lumbago with sciatica, left side: Secondary | ICD-10-CM | POA: Diagnosis not present

## 2022-10-16 NOTE — Therapy (Signed)
OUTPATIENT PHYSICAL THERAPY THORACOLUMBAR TREATMENT   Patient Name: Ashley [name] MRN: [id number] DOB:1956-03-16, 66 y.o., female Today's Date: 10/17/2022   PT End of Session - 10/16/22 1319     Visit Number 9    Number of Visits 18    Date for PT Re-Evaluation 10/28/22    Authorization Type [employer]    PT Start Time 1303    PT Stop Time 1345    PT Time Calculation (min) 42 min    Activity Tolerance Patient tolerated treatment well    Behavior During Therapy [facility] for tasks assessed/performed                 Past Medical History:  Diagnosis Date   Anemia    Anxiety 2020   Arthritis    Asthma 2013-2014   Depression    Dyspnea 2013   Fibromyalgia 09/04/2014   GERD (gastroesophageal reflux disease)    Headache    History of hiatal hernia    Hypertension    Lumbar radicular pain 09/04/2014   Pre-diabetes    Sacroiliac joint pain    nonunion of left SI joint   Sleep apnea 2016   Spinal headache    trouble waking    TIA (transient ischemic attack)    Wears glasses    Past Surgical History:  Procedure Laterality Date   ABDOMINAL HYSTERECTOMY     BACK SURGERY  2002   BREAST LUMPECTOMY Left 1985   BREAST SURGERY     BUNIONECTOMY     COLONOSCOPY     DILATION AND CURETTAGE OF UTERUS     ESOPHAGEAL MANOMETRY [name] 11/21/2013   Procedure: ESOPHAGEAL MANOMETRY (EM);  Surgeon: Ashley Friar, MD;  Location: [facility];  Service: Endoscopy;  Laterality: [name];   KNEE ARTHROSCOPY W/ MENISCAL REPAIR     LUMBAR LAMINECTOMY     MULTIPLE TOOTH EXTRACTIONS     NASAL SINUS SURGERY     PAROTIDECTOMY Left 09/21/2020   Procedure: LEFT SUPERFICIAL PAROTIDECTOMY WITH NERVE MONITORING;  Surgeon: Ashley Coho, MD;  Location: [facility] OR;  Service: ENT;  Laterality: Left;   SACROILIAC JOINT FUSION Left 08/05/2018   Procedure: REVISION LEFT SACROILIAC JOINT FUSION WITH INSTRUMENTATION AND ALLOGRAFT;  Surgeon: Ashley Bamberg, MD;  Location: [facility] OR;  Service:  Orthopedics;  Laterality: Left;   TONSILLECTOMY     Patient Active Problem List   Diagnosis Date Noted   Mass of left parotid gland 09/21/2020   Parotid mass 09/21/2020   De Quervain's tenosynovitis 06/22/2019   Trigger middle finger of right hand 04/27/2019   Stiffness of finger joint, right 04/27/2019   Xerostomia 02/23/2019   Dry eyes 02/23/2019   Family history of diabetes mellitus 01/26/2019   Family history of ischemic heart disease (IHD) 01/26/2019   Family history of stroke 01/26/2019   Hiatal hernia 01/26/2019   Knee pain, left 01/26/2019   Sacroiliac joint dysfunction 08/05/2018   Spondylosis of lumbar region without myelopathy or radiculopathy 05/16/2016   Generalized headaches 04/21/2016   History of migraine 04/21/2016   Menopause 04/09/2016   Benign hypertension 04/09/2016   Depressive disorder 04/09/2016   Disorder of thyroid gland 04/09/2016   Dysphagia 04/09/2016   Chronic low back pain with left-sided sciatica 03/24/2016   Asthmatic bronchitis 09/06/2014   GERD (gastroesophageal reflux disease) 09/06/2014   Allergic rhinitis 09/06/2014   Lumbar radicular pain 09/04/2014   Fibromyalgia 09/04/2014   Back pain 06/01/2013   Cubital tunnel syndrome 06/01/2013   Muscle spasms of  neck 06/01/2013   Myofascial pain 06/01/2013   Neuropathic pain 06/01/2013   Right wrist pain 06/01/2013   Shoulder pain 06/01/2013   Primary localized osteoarthrosis, lower leg 12/08/2012   Encounter for routine gynecological examination 11/16/2012   Borderline hypertension 06/16/2012   Schatzki's ring 05/21/2012   G6PD deficiency 04/14/2012   Lumbar post-laminectomy syndrome 08/26/2011   SOB (shortness of breath) 04/27/2009    PCP: None  REFERRING PROVIDER: Henrine Screws, MD   REFERRING DIAG: Lumbago with sciatica, left side [M54.42], Other chronic pain [G89.29]  Rationale for Evaluation and Treatment Rehabilitation  THERAPY DIAG:  Other low back pain  Muscle  weakness (generalized)  Abnormal posture  ONSET DATE: February 2016.  SUBJECTIVE:                                                                                                                                                                                           SUBJECTIVE STATEMENT: Pt states she felt good last week after both land and aquatic PT treatments.  Pt states she had significant pain after cooking on Sunday.  Pt states she cooked for 2 hours.  She has to bend down to grab pots/pans out of cabinets.  Pt states her back has calmed down some, though she can't calm down her sciatica.  She has been hurting since Sunday.  Pt performed the nn flossing exercise which helps though hasn't calmed it down.  Pt states she has been trying to be consistent with HEP.     From Eval:  Ashley [name] presents to PT with chronic L lower back pain with sciaticia. It started in Feb 2016 when she fell on her butt and tore her SI joint. She got a fusion a few months later, which was repeated again a few years later. 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 She reports constant pain in her L lower back that radiates into her L LE. She got a spinal stimulator placed in June 2023 which has helped with her lower back pain, but not the leg pain. The only thing that helps reduce leg pain is opioids which she doesn't like to take frequently. She reports wearing a heel lift in her L LE due to scoliosis.   PERTINENT HISTORY:  SI joint fusion in 2019 and SI joint revision in 2020 ; Lumbar laminectomy and foraminatomy in 2002 Spinal cord stimulator in June 2023 Anxiety, Depression, Fibromyalgia, HTN, Pre Diabetic, TIA, Scoliosis.   PAIN:  Are you having pain? Yes: NPRS scale: 9/10 / 3-4/10 Pain location:  Sciatic pain down entire L LE to foot /  L sided lumbar pain Pain description: Sharp pain, burning. Aggravating factors: Bending over, reaching OH, walking, standing, sitting.   Relieving factors: Opioids, rest, ice, heat, stimulator.   PRECAUTIONS: None  WEIGHT BEARING RESTRICTIONS: No  FALLS:  Has patient fallen in last 6 months? Yes. Number of falls 2, when stepping backwards onto a step, and when cleaning behind bedroom door and trying to move something out of the way. Both falls in posterior direction.   LIVING ENVIRONMENT: Lives with:  Lives with her mother who has dementia Lives in: House/apartment Stairs: Yes: External: 3 steps; none and 8 in back with railings.  Has following equipment at home:  Electric scooter if long distance, walking stick.   OCCUPATION: Disability due to scoring 30% on functional capacity evaluation.   PLOF: Independent  PATIENT GOALS: Pt would like to reduce her pain and be more functional.    OBJECTIVE:   DIAGNOSTIC FINDINGS:  None.     TODAY'S TREATMENT:  Therapeutic Exercise: Reviewed HEP compliance, pain level, and response to prior Rx. Supine alt UE/LE 2x10 reps Supine clams with TrA with RTB 2x10 Supine alt LE extension with TrA 2x 10 while holding ball at 90 deg shoulder flexion Supine shoulder flex and ext with TrA x10 with ball and x 10 with 2#  Supine SLR 2x10 bilat with TrA S/L hip abduction with TrA 2x10 Standing rows with TrA 2x10 with TrA  Manual Therapy: Pt received STM to L sided lumbar paraspinals and L glute to improve soft tissue mobility, tightness, and pain in R S/L'ing with pillow b/w knees. Pt received L HS stretch with passive AP's for nn flossing 3x30 sec on L.    PATIENT EDUCATION:  Education details:relevant anatomy, diagnosis, prognosis, HEP, exercise form and rationale, and POC. Person educated: Patient Education method: Customer service manager, verbal cues Education comprehension: verbalized understanding and returned demonstration, verbal cues required  HOME EXERCISE PROGRAM: Pt  is already performing LTR, bridges, supine HS stretch, and Piriformis stretch after  reviewing. She denied a handout.  Access Code: [email or web address] URL: [email or web address] Date: 09/10/2022 Prepared by: Ronny Flurry  Exercises - Supine Transversus Abdominis Bracing - Hands on Stomach  - 2 x daily - 7 x weekly - 2 sets - 10 reps - 5 seconds hold - Supine March  - 1-2 x daily - 7 x weekly - 2 sets - 10 reps - Hooklying Clamshell with Resistance  - 1 x daily - 4 x weekly - 2 sets - 10 reps   ASSESSMENT:  CLINICAL IMPRESSION:  Pt reports improved pain and sx's after both land and aquatic Rx's.  Pt c/o's of having increased pain all week due to cooking on Sunday.  Pt reports improved sx's with neural sensitizing maneuvers at home.  Pt performed core strengthening exercises and PT performed nn sensitizing maneuvers and STM.  She has areas of tenderness and trigger points in L glute.  Pt responded well to Rx stating she felt so much better after Rx.  She states her L glute, thigh, and foot feel better after Rx.  Her L LE sciatic pain improved from 9/10 before Rx to 5/10 after Rx and lumbar pain improved from 3-4/10 to 2/10 after Rx.  Pt should cont to benefit from cont skilled PT services to address ongoing goals and to improve overall function.   OBJECTIVE IMPAIRMENTS: decreased activity tolerance, difficulty walking, decreased balance, decreased endurance, decreased mobility, decreased ROM, decreased strength, impaired flexibility, impaired UE/LE use, postural dysfunction, and pain.  ACTIVITY  LIMITATIONS: bending, lifting, carry, locomotion, cleaning, community activity, driving, and or occupation  PERSONAL FACTORS: Anxiety, Depression, Fibromyalgia, HTN, Pre Diabetic, TIA are also affecting patient's functional outcome.  REHAB POTENTIAL: Good  CLINICAL DECISION MAKING: Stable/uncomplicated  EVALUATION COMPLEXITY: Low    GOALS: Short term PT Goals Target date: 09/23/22 Pt will be I and compliant with HEP. Baseline:  Goal status: New Pt will decrease pain by  25% overall Baseline: Goal status: New  Long term PT goals Target date: 10/21/22 Pt will improve ROM to [facility] to improve functional mobility Baseline: Goal status: New Pt will improve  hip/knee strength to at least 5-/5 MMT to improve functional strength Baseline: Goal status: New Pt will improve FOTO to at least 45% functional to show improved function Baseline: Goal status: New Pt will reduce pain by overall 50% overall with usual activity Baseline: Goal status: New Pt will reduce pain to overall less than 2-3/10 with usual activity and work activity. Baseline: Goal status: New Pt will be able to ambulate community distances at least 1000 ft WNL gait pattern without complaints Baseline: Goal status: New      7. Pt will improve her STS by 2.3 seconds for Minimal clinically important difference.         Baseline:         Goal status: New       8. Pt will improve TUG by 3.4 seconds or Minimal clinically important difference.        Baseline:        Goal status: New  PLAN: PT FREQUENCY: 1-3 times per week   PT DURATION: 6-8 weeks  PLANNED INTERVENTIONS (unless contraindicated): aquatic PT, Canalith repositioning, cryotherapy, Electrical stimulation, Iontophoresis with 4 mg/ml dexamethasome, Moist heat, traction, Ultrasound, gait training, Therapeutic exercise, balance training, neuromuscular re-education, patient/family education, prosthetic training, manual techniques, passive ROM, dry needling, taping, vasopnuematic device, vestibular, spinal manipulations, joint manipulations  PLAN FOR NEXT SESSION:  Cont with aquatic and land based therapy.  continue to progress functional mobility, strengthen L LE, TrA, and glutes muscles. Decrease patients pain and help minimize functional deficits. Pt may seek referral for Lt shoulder (plans to see ortho Dr to assess).  Cont with STW and core strengthening.   Cont with supine manual HS stretch with passive AP's.   Pt is having eyelid surgery  next week.  PT instructed to speak to MD to find out when she will be cleared to return to PT.     Selinda Michaels III PT, DPT 10/17/22 10:11 AM

## 2022-10-19 ENCOUNTER — Other Ambulatory Visit: Payer: Self-pay | Admitting: Psychiatry

## 2022-10-19 NOTE — Therapy (Incomplete)
OUTPATIENT PHYSICAL THERAPY THORACOLUMBAR TREATMENT   Patient Name: Ashley [name] MRN: [id number] DOB:1956-03-16, 66 y.o., female Today's Date: 10/17/2022   PT End of Session - 10/16/22 1319     Visit Number 9    Number of Visits 18    Date for PT Re-Evaluation 10/28/22    Authorization Type BCBS/[employer] PPO    PT Start Time 1303    PT Stop Time 1345    PT Time Calculation (min) 42 min    Activity Tolerance Patient tolerated treatment well    Behavior During Therapy [facility] for tasks assessed/performed                 Past Medical History:  Diagnosis Date   Anemia    Anxiety 2020   Arthritis    Asthma 2013-2014   Depression    Dyspnea 2013   Fibromyalgia 09/04/2014   GERD (gastroesophageal reflux disease)    Headache    History of hiatal hernia    Hypertension    Lumbar radicular pain 09/04/2014   Pre-diabetes    Sacroiliac joint pain    nonunion of left SI joint   Sleep apnea 2016   Spinal headache    trouble waking    TIA (transient ischemic attack)    Wears glasses    Past Surgical History:  Procedure Laterality Date   ABDOMINAL HYSTERECTOMY     BACK SURGERY  2002   BREAST LUMPECTOMY Left 1985   BREAST SURGERY     BUNIONECTOMY     COLONOSCOPY     DILATION AND CURETTAGE OF UTERUS     ESOPHAGEAL MANOMETRY [name] 11/21/2013   Procedure: ESOPHAGEAL MANOMETRY (EM);  Surgeon: Shirley Friar, MD;  Location: [facility];  Service: Endoscopy;  Laterality: [name];   KNEE ARTHROSCOPY W/ MENISCAL REPAIR     LUMBAR LAMINECTOMY     MULTIPLE TOOTH EXTRACTIONS     NASAL SINUS SURGERY     PAROTIDECTOMY Left 09/21/2020   Procedure: LEFT SUPERFICIAL PAROTIDECTOMY WITH NERVE MONITORING;  Surgeon: Osborn Coho, MD;  Location: [facility] OR;  Service: ENT;  Laterality: Left;   SACROILIAC JOINT FUSION Left 08/05/2018   Procedure: REVISION LEFT SACROILIAC JOINT FUSION WITH INSTRUMENTATION AND ALLOGRAFT;  Surgeon: Estill Bamberg, MD;  Location: [facility] OR;  Service:  Orthopedics;  Laterality: Left;   TONSILLECTOMY     Patient Active Problem List   Diagnosis Date Noted   Mass of left parotid gland 09/21/2020   Parotid mass 09/21/2020   De Quervain's tenosynovitis 06/22/2019   Trigger middle finger of right hand 04/27/2019   Stiffness of finger joint, right 04/27/2019   Xerostomia 02/23/2019   Dry eyes 02/23/2019   Family history of diabetes mellitus 01/26/2019   Family history of ischemic heart disease (IHD) 01/26/2019   Family history of stroke 01/26/2019   Hiatal hernia 01/26/2019   Knee pain, left 01/26/2019   Sacroiliac joint dysfunction 08/05/2018   Spondylosis of lumbar region without myelopathy or radiculopathy 05/16/2016   Generalized headaches 04/21/2016   History of migraine 04/21/2016   Menopause 04/09/2016   Benign hypertension 04/09/2016   Depressive disorder 04/09/2016   Disorder of thyroid gland 04/09/2016   Dysphagia 04/09/2016   Chronic low back pain with left-sided sciatica 03/24/2016   Asthmatic bronchitis 09/06/2014   GERD (gastroesophageal reflux disease) 09/06/2014   Allergic rhinitis 09/06/2014   Lumbar radicular pain 09/04/2014   Fibromyalgia 09/04/2014   Back pain 06/01/2013   Cubital tunnel syndrome 06/01/2013   Muscle spasms of  neck 06/01/2013   Myofascial pain 06/01/2013   Neuropathic pain 06/01/2013   Right wrist pain 06/01/2013   Shoulder pain 06/01/2013   Primary localized osteoarthrosis, lower leg 12/08/2012   Encounter for routine gynecological examination 11/16/2012   Borderline hypertension 06/16/2012   Schatzki's ring 05/21/2012   G6PD deficiency 04/14/2012   Lumbar post-laminectomy syndrome 08/26/2011   SOB (shortness of breath) 04/27/2009    PCP: None  REFERRING PROVIDER: Henrine Screws, MD   REFERRING DIAG: Lumbago with sciatica, left side [M54.42], Other chronic pain [G89.29]  Rationale for Evaluation and Treatment Rehabilitation  THERAPY DIAG:  Other low back pain  Muscle  weakness (generalized)  Abnormal posture  ONSET DATE: February 2016.  SUBJECTIVE:                                                                                                                                                                                           SUBJECTIVE STATEMENT: Pt states she felt good last week after both land and aquatic PT treatments.  Pt states she had significant pain after cooking on Sunday.  Pt states she cooked for 2 hours.  She has to bend down to grab pots/pans out of cabinets.  Pt states her back has calmed down some, though she can't calm down her sciatica.  She has been hurting since Sunday.  Pt performed the nn flossing exercise which helps though hasn't calmed it down.  Pt states she has been trying to be consistent with HEP.     From Eval:  Ashley [name] presents to PT with chronic L lower back pain with sciaticia. It started in Feb 2016 when she fell on her butt and tore her SI joint. She got a fusion a few months later, which was repeated again a few years later. 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 She reports constant pain in her L lower back that radiates into her L LE. She got a spinal stimulator placed in June 2023 which has helped with her lower back pain, but not the leg pain. The only thing that helps reduce leg pain is opioids which she doesn't like to take frequently. She reports wearing a heel lift in her L LE due to scoliosis.   PERTINENT HISTORY:  SI joint fusion in 2019 and SI joint revision in 2020 ; Lumbar laminectomy and foraminatomy in 2002 Spinal cord stimulator in June 2023 Anxiety, Depression, Fibromyalgia, HTN, Pre Diabetic, TIA, Scoliosis.   PAIN:  Are you having pain? Yes: NPRS scale: 9/10 / 3-4/10 Pain location:  Sciatic pain down entire L LE to foot /  L sided lumbar pain Pain description: Sharp pain, burning. Aggravating factors: Bending over, reaching OH, walking, standing, sitting.   Relieving factors: Opioids, rest, ice, heat, stimulator.   PRECAUTIONS: None  WEIGHT BEARING RESTRICTIONS: No  FALLS:  Has patient fallen in last 6 months? Yes. Number of falls 2, when stepping backwards onto a step, and when cleaning behind bedroom door and trying to move something out of the way. Both falls in posterior direction.   LIVING ENVIRONMENT: Lives with:  Lives with her mother who has dementia Lives in: House/apartment Stairs: Yes: External: 3 steps; none and 8 in back with railings.  Has following equipment at home:  Electric scooter if long distance, walking stick.   OCCUPATION: Disability due to scoring 30% on functional capacity evaluation.   PLOF: Independent  PATIENT GOALS: Pt would like to reduce her pain and be more functional.    OBJECTIVE:   DIAGNOSTIC FINDINGS:  None.     TODAY'S TREATMENT:  Pt seen for aquatic therapy today.  Treatment took place in water 3.25-4.75 ft in depth at the [facility] pool. Temp of water was 91.  Pt entered/exited the pool via stairs independently with bilat rail.     * in 4+ ft of water: unsupported walking forward/ backward / side stepping / high knee marching forward/ backward; *Balance with core firing using hand bells LE staggered then together. Bilateral then unilateral hand beels. Oscillating in sagittal and frontal planes *L stretch with "tail wagging" *QL stretch *pelvic ROM seated on squoodle: ant/post pelvic tilts; hip hiking; rotation *ankle fin sagittal plane movement for left sided core engagement * straddling yellow noodle:  cycling with ue hand support  Last session: Therapeutic Exercise: Reviewed HEP compliance, pain level, and response to prior Rx. Supine alt UE/LE 2x10 reps Supine clams with TrA with RTB 2x10 Supine alt LE extension with TrA 2x 10 while holding ball at 90 deg shoulder flexion Supine shoulder flex and ext with TrA x10 with ball and x 10 with 2#  Supine SLR 2x10 bilat  with TrA S/L hip abduction with TrA 2x10 Standing rows with TrA 2x10 with TrA  Manual Therapy: Pt received STM to L sided lumbar paraspinals and L glute to improve soft tissue mobility, tightness, and pain in R S/L'ing with pillow b/w knees. Pt received L HS stretch with passive AP's for nn flossing 3x30 sec on L.    PATIENT EDUCATION:  Education details:relevant anatomy, diagnosis, prognosis, HEP, exercise form and rationale, and POC. Person educated: Patient Education method: Medical illustrator, verbal cues Education comprehension: verbalized understanding and returned demonstration, verbal cues required  HOME EXERCISE PROGRAM: Pt  is already performing LTR, bridges, supine HS stretch, and Piriformis stretch after reviewing. She denied a handout.  Access Code: [email or web address] URL: [email or web address] Date: 09/10/2022 Prepared by: Aaron Edelman  Exercises - Supine Transversus Abdominis Bracing - Hands on Stomach  - 2 x daily - 7 x weekly - 2 sets - 10 reps - 5 seconds hold - Supine March  - 1-2 x daily - 7 x weekly - 2 sets - 10 reps - Hooklying Clamshell with Resistance  - 1 x daily - 4 x weekly - 2 sets - 10 reps   ASSESSMENT:  CLINICAL IMPRESSION:  Pt reports improved pain and sx's after both land and aquatic Rx's.  Pt c/o's of having increased pain all week due to cooking on Sunday.  Pt reports improved sx's with neural sensitizing maneuvers at home.  Pt performed core strengthening  exercises and PT performed nn sensitizing maneuvers and STM.  She has areas of tenderness and trigger points in L glute.  Pt responded well to Rx stating she felt so much better after Rx.  She states her L glute, thigh, and foot feel better after Rx.  Her L LE sciatic pain improved from 9/10 before Rx to 5/10 after Rx and lumbar pain improved from 3-4/10 to 2/10 after Rx.  Pt should cont to benefit from cont skilled PT services to address ongoing goals and to improve overall  function.   OBJECTIVE IMPAIRMENTS: decreased activity tolerance, difficulty walking, decreased balance, decreased endurance, decreased mobility, decreased ROM, decreased strength, impaired flexibility, impaired UE/LE use, postural dysfunction, and pain.  ACTIVITY LIMITATIONS: bending, lifting, carry, locomotion, cleaning, community activity, driving, and or occupation  PERSONAL FACTORS: Anxiety, Depression, Fibromyalgia, HTN, Pre Diabetic, TIA are also affecting patient's functional outcome.  REHAB POTENTIAL: Good  CLINICAL DECISION MAKING: Stable/uncomplicated  EVALUATION COMPLEXITY: Low    GOALS: Short term PT Goals Target date: 09/23/22 Pt will be I and compliant with HEP. Baseline:  Goal status: New Pt will decrease pain by 25% overall Baseline: Goal status: New  Long term PT goals Target date: 10/21/22 [facility] improve functional mobility Baseline: Goal status: New Pt will improve  hip/knee strength to at least 5-/5 MMT to improve functional strength Baseline: Goal status: New Pt will improve FOTO to at least 45% functional to show improved function Baseline: Goal status: New Pt will reduce pain by overall 50% overall with usual activity Baseline: Goal status: New Pt will reduce pain to overall less than 2-3/10 with usual activity and work activity. Baseline: Goal status: New Pt will be able to ambulate community distances at least 1000 ft WNL gait pattern without complaints Baseline: Goal status: New      7. Pt will improve her STS by 2.3 seconds for Minimal clinically important difference.         Baseline:         Goal status: New       8. Pt will improve TUG by 3.4 seconds or Minimal clinically important difference.        Baseline:        Goal status: New  PLAN: PT FREQUENCY: 1-3 times per week   PT DURATION: 6-8 weeks  PLANNED INTERVENTIONS (unless contraindicated): aquatic PT, Canalith repositioning, cryotherapy, Electrical  stimulation, Iontophoresis with 4 mg/ml dexamethasome, Moist heat, traction, Ultrasound, gait training, Therapeutic exercise, balance training, neuromuscular re-education, patient/family education, prosthetic training, manual techniques, passive ROM, dry needling, taping, vasopnuematic device, vestibular, spinal manipulations, joint manipulations  PLAN FOR NEXT SESSION:  Cont with aquatic and land based therapy.  continue to progress functional mobility, strengthen L LE, TrA, and glutes muscles. Decrease patients pain and help minimize functional deficits. Pt may seek referral for Lt shoulder (plans to see ortho Dr to assess).  Cont with STW and core strengthening.   Cont with supine manual HS stretch with passive AP's.   Pt is having eyelid surgery next week.  PT instructed to speak to MD to find out when she will be cleared to return to PT.     Kharis Lapenna Tomma Lightning(Frankie) [place] MPT

## 2022-10-20 ENCOUNTER — Ambulatory Visit (HOSPITAL_BASED_OUTPATIENT_CLINIC_OR_DEPARTMENT_OTHER): Payer: Federal, State, Local not specified - PPO | Admitting: Physical Therapy

## 2022-10-20 ENCOUNTER — Other Ambulatory Visit: Payer: Self-pay

## 2022-10-20 MED ORDER — GABAPENTIN 100 MG PO CAPS: ORAL_CAPSULE | ORAL | 2 refills | Status: DC

## 2022-10-22 DIAGNOSIS — R7303 Prediabetes: Secondary | ICD-10-CM | POA: Diagnosis not present

## 2022-10-23 ENCOUNTER — Ambulatory Visit (HOSPITAL_BASED_OUTPATIENT_CLINIC_OR_DEPARTMENT_OTHER): Payer: Federal, State, Local not specified - PPO | Admitting: Physical Therapy

## 2022-10-23 DIAGNOSIS — M5459 Other low back pain: Secondary | ICD-10-CM | POA: Diagnosis not present

## 2022-10-23 DIAGNOSIS — R293 Abnormal posture: Secondary | ICD-10-CM

## 2022-10-23 DIAGNOSIS — M5442 Lumbago with sciatica, left side: Secondary | ICD-10-CM | POA: Diagnosis not present

## 2022-10-23 DIAGNOSIS — M6281 Muscle weakness (generalized): Secondary | ICD-10-CM

## 2022-10-23 DIAGNOSIS — R2689 Other abnormalities of gait and mobility: Secondary | ICD-10-CM | POA: Diagnosis not present

## 2022-10-23 DIAGNOSIS — G8929 Other chronic pain: Secondary | ICD-10-CM | POA: Diagnosis not present

## 2022-10-23 NOTE — Therapy (Signed)
OUTPATIENT PHYSICAL THERAPY THORACOLUMBAR TREATMENT   Patient Name: Ashley [name] MRN: [id number] DOB:02/11/1956, 66 y.o., female Today's Date: 10/24/2022   PT End of Session - 10/23/22 1414     Visit Number 10    Number of Visits 18    Date for PT Re-Evaluation 10/28/22    Authorization Type BCBS/[employer] PPO    PT Start Time 1411   Pt arrived 26 mins late to Rx.  pt thought the treatment time was later.   PT Stop Time 1431    PT Time Calculation (min) 20 min    Activity Tolerance Patient tolerated treatment well    Behavior During Therapy [facility] for tasks assessed/performed                 Past Medical History:  Diagnosis Date   Anemia    Anxiety 2020   Arthritis    Asthma 2013-2014   Depression    Dyspnea 2013   Fibromyalgia 09/04/2014   GERD (gastroesophageal reflux disease)    Headache    History of hiatal hernia    Hypertension    Lumbar radicular pain 09/04/2014   Pre-diabetes    Sacroiliac joint pain    nonunion of left SI joint   Sleep apnea 2016   Spinal headache    trouble waking    TIA (transient ischemic attack)    Wears glasses    Past Surgical History:  Procedure Laterality Date   ABDOMINAL HYSTERECTOMY     BACK SURGERY  2002   BREAST LUMPECTOMY Left 1985   BREAST SURGERY     BUNIONECTOMY     COLONOSCOPY     DILATION AND CURETTAGE OF UTERUS     ESOPHAGEAL MANOMETRY [name] 11/21/2013   Procedure: ESOPHAGEAL MANOMETRY (EM);  Surgeon: Shirley Friar, MD;  Location: [facility];  Service: Endoscopy;  Laterality: [name];   KNEE ARTHROSCOPY W/ MENISCAL REPAIR     LUMBAR LAMINECTOMY     MULTIPLE TOOTH EXTRACTIONS     NASAL SINUS SURGERY     PAROTIDECTOMY Left 09/21/2020   Procedure: LEFT SUPERFICIAL PAROTIDECTOMY WITH NERVE MONITORING;  Surgeon: Osborn Coho, MD;  Location: [facility] OR;  Service: ENT;  Laterality: Left;   SACROILIAC JOINT FUSION Left 08/05/2018   Procedure: REVISION LEFT SACROILIAC JOINT FUSION WITH INSTRUMENTATION  AND ALLOGRAFT;  Surgeon: Estill Bamberg, MD;  Location: [facility] OR;  Service: Orthopedics;  Laterality: Left;   TONSILLECTOMY     Patient Active Problem List   Diagnosis Date Noted   Mass of left parotid gland 09/21/2020   Parotid mass 09/21/2020   De Quervain's tenosynovitis 06/22/2019   Trigger middle finger of right hand 04/27/2019   Stiffness of finger joint, right 04/27/2019   Xerostomia 02/23/2019   Dry eyes 02/23/2019   Family history of diabetes mellitus 01/26/2019   Family history of ischemic heart disease (IHD) 01/26/2019   Family history of stroke 01/26/2019   Hiatal hernia 01/26/2019   Knee pain, left 01/26/2019   Sacroiliac joint dysfunction 08/05/2018   Spondylosis of lumbar region without myelopathy or radiculopathy 05/16/2016   Generalized headaches 04/21/2016   History of migraine 04/21/2016   Menopause 04/09/2016   Benign hypertension 04/09/2016   Depressive disorder 04/09/2016   Disorder of thyroid gland 04/09/2016   Dysphagia 04/09/2016   Chronic low back pain with left-sided sciatica 03/24/2016   Asthmatic bronchitis 09/06/2014   GERD (gastroesophageal reflux disease) 09/06/2014   Allergic rhinitis 09/06/2014   Lumbar radicular pain 09/04/2014   Fibromyalgia 09/04/2014  Back pain 06/01/2013   Cubital tunnel syndrome 06/01/2013   Muscle spasms of neck 06/01/2013   Myofascial pain 06/01/2013   Neuropathic pain 06/01/2013   Right wrist pain 06/01/2013   Shoulder pain 06/01/2013   Primary localized osteoarthrosis, lower leg 12/08/2012   Encounter for routine gynecological examination 11/16/2012   Borderline hypertension 06/16/2012   Schatzki's ring 05/21/2012   G6PD deficiency 04/14/2012   Lumbar post-laminectomy syndrome 08/26/2011   SOB (shortness of breath) 04/27/2009    PCP: None  REFERRING PROVIDER: Henrine Screws, MD   REFERRING DIAG: Lumbago with sciatica, left side [M54.42], Other chronic pain [G89.29]  Rationale for Evaluation and  Treatment Rehabilitation  THERAPY DIAG:  Other low back pain  Muscle weakness (generalized)  Abnormal posture  ONSET DATE: February 2016.  SUBJECTIVE:                                                                                                                                                                                           SUBJECTIVE STATEMENT: Pt states she felt fine after prior Rx.  Pt was unable to have the eye surgery yesterday due to MD being sick.  Pt reports compliance with HEP.  She is not feeling it in her foot today.   Pt states she felt good after prior Rx.    PERTINENT HISTORY:  SI joint fusion in 2019 and SI joint revision in 2020 ; Lumbar laminectomy and foraminatomy in 2002 Spinal cord stimulator in June 2023 Anxiety, Depression, Fibromyalgia, HTN, Pre Diabetic, TIA, Scoliosis.   PAIN:  Are you having pain? Yes: NPRS scale: 6/10  Pain location:  L sided lumbar pain, L hip pain to post knee Pain description: Sharp pain, burning. Aggravating factors: Bending over, reaching OH, walking, standing, sitting.  Relieving factors: Opioids, rest, ice, heat, stimulator.   PRECAUTIONS: None  WEIGHT BEARING RESTRICTIONS: No  FALLS:  Has patient fallen in last 6 months? Yes. Number of falls 2, when stepping backwards onto a step, and when cleaning behind bedroom door and trying to move something out of the way. Both falls in posterior direction.   LIVING ENVIRONMENT: Lives with:  Lives with her mother who has dementia Lives in: House/apartment Stairs: Yes: External: 3 steps; none and 8 in back with railings.  Has following equipment at home:  Electric scooter if long distance, walking stick.   OCCUPATION: Disability due to scoring 30% on functional capacity evaluation.   PLOF: Independent  PATIENT GOALS: Pt would like to reduce her pain and be more functional.    OBJECTIVE:   DIAGNOSTIC FINDINGS:  None.     TODAY'S TREATMENT:  Therapeutic  Exercise: Supine alt UE/LE x12 reps Supine shoulder flex and ext with TrA x10 with 2# Supine SLR with contralateral UE extension 2x10 bilat with TrA S/L hip abduction with TrA 2x10  Pt received L HS stretch with passive AP's for nn flossing 3x30 sec on L.    PATIENT EDUCATION:  Education details:relevant anatomy, HEP, exercise form and rationale, and POC. Person educated: Patient Education method: Medical illustrator, verbal cues Education comprehension: verbalized understanding and returned demonstration, verbal cues required  HOME EXERCISE PROGRAM: Pt  is already performing LTR, bridges, supine HS stretch, and Piriformis stretch after reviewing. She denied a handout.  Access Code: [email or web address] URL: [email or web address] Date: 09/10/2022 Prepared by: Aaron Edelman  Exercises - Supine Transversus Abdominis Bracing - Hands on Stomach  - 2 x daily - 7 x weekly - 2 sets - 10 reps - 5 seconds hold - Supine March  - 1-2 x daily - 7 x weekly - 2 sets - 10 reps - Hooklying Clamshell with Resistance  - 1 x daily - 4 x weekly - 2 sets - 10 reps   ASSESSMENT:  CLINICAL IMPRESSION:  Treatment time limited today due to pt arriving late because she thought her treatment time was later.  Pt presents to Rx stating she doesn't feel it in her foot and the pain stops at post knee.  Pt performed core and hip exercises well.  Pt responded well to Rx.  She states her pain is about the same but her hip feels a little worse after the manual nerve flossing.  Pt states she will feel fine as she walks it off.  Pt should cont to benefit from cont skilled PT services to address ongoing goals and to improve overall function.    OBJECTIVE IMPAIRMENTS: decreased activity tolerance, difficulty walking, decreased balance, decreased endurance, decreased mobility, decreased ROM, decreased strength, impaired flexibility, impaired UE/LE use, postural dysfunction, and pain.  ACTIVITY LIMITATIONS:  bending, lifting, carry, locomotion, cleaning, community activity, driving, and or occupation  PERSONAL FACTORS: Anxiety, Depression, Fibromyalgia, HTN, Pre Diabetic, TIA are also affecting patient's functional outcome.  REHAB POTENTIAL: Good  CLINICAL DECISION MAKING: Stable/uncomplicated  EVALUATION COMPLEXITY: Low    GOALS: Short term PT Goals Target date: 09/23/22 Pt will be I and compliant with HEP. Baseline:  Goal status: New Pt will decrease pain by 25% overall Baseline: Goal status: New  Long term PT goals Target date: 10/21/22 Pt will improve ROM to [facility] to improve functional mobility Baseline: Goal status: New Pt will improve  hip/knee strength to at least 5-/5 MMT to improve functional strength Baseline: Goal status: New Pt will improve FOTO to at least 45% functional to show improved function Baseline: Goal status: New Pt will reduce pain by overall 50% overall with usual activity Baseline: Goal status: New Pt will reduce pain to overall less than 2-3/10 with usual activity and work activity. Baseline: Goal status: New Pt will be able to ambulate community distances at least 1000 ft WNL gait pattern without complaints Baseline: Goal status: New      7. Pt will improve her STS by 2.3 seconds for Minimal clinically important difference.         Baseline:         Goal status: New       8. Pt will improve TUG by 3.4 seconds or Minimal clinically important difference.        Baseline:        Goal status: New  PLAN: PT FREQUENCY: 1-3  times per week   PT DURATION: 6-8 weeks  PLANNED INTERVENTIONS (unless contraindicated): aquatic PT, Canalith repositioning, cryotherapy, Electrical stimulation, Iontophoresis with 4 mg/ml dexamethasome, Moist heat, traction, Ultrasound, gait training, Therapeutic exercise, balance training, neuromuscular re-education, patient/family education, prosthetic training, manual techniques, passive ROM, dry needling, taping,  vasopnuematic device, vestibular, spinal manipulations, joint manipulations  PLAN FOR NEXT SESSION:  Cont with aquatic and land based therapy.  continue to progress functional mobility, strengthen L LE, TrA, and glutes muscles. Decrease patients pain and help minimize functional deficits. Pt may seek referral for Lt shoulder (plans to see ortho Dr to assess).  Cont with STW and core strengthening.   Cont with supine manual HS stretch with passive AP's.   Pt is having eyelid surgery next week.  PT instructed to speak to MD to find out when she will be cleared to return to PT.     Audie Clear III PT, DPT 10/24/22 3:13 PM

## 2022-10-24 ENCOUNTER — Encounter (HOSPITAL_BASED_OUTPATIENT_CLINIC_OR_DEPARTMENT_OTHER): Payer: Self-pay | Admitting: Physical Therapy

## 2022-10-24 ENCOUNTER — Telehealth: Payer: Self-pay | Admitting: Emergency Medicine

## 2022-10-24 MED ORDER — ALBUTEROL SULFATE HFA 108 (90 BASE) MCG/ACT IN AERS: 1.0000 | INHALATION_SPRAY | Freq: Four times a day (QID) | RESPIRATORY_TRACT | 6 refills | Status: AC | PRN

## 2022-10-24 MED ORDER — AZELASTINE HCL 0.05 % OP SOLN: 2.0000 [drp] | Freq: Two times a day (BID) | OPHTHALMIC | 4 refills | Status: AC

## 2022-10-24 MED ORDER — SYSTANE 0.4-0.3 % OP SOLN: 1.0000 [drp] | Freq: Four times a day (QID) | OPHTHALMIC | 3 refills | Status: AC | PRN

## 2022-10-24 MED ORDER — BUDESONIDE-FORMOTEROL FUMARATE 80-4.5 MCG/ACT IN AERO: 2.0000 | INHALATION_SPRAY | Freq: Two times a day (BID) | RESPIRATORY_TRACT | 5 refills | Status: AC

## 2022-10-24 NOTE — Telephone Encounter (Signed)
Called and spoke to patient and went over the medications she was needing refilled. Today. Got all of her medications refilled and verified pharmacy while on phone. Nothing further needed

## 2022-10-27 NOTE — Therapy (Incomplete)
OUTPATIENT PHYSICAL THERAPY THORACOLUMBAR TREATMENT   Patient Name: Ashley [name] MRN: [id number] DOB:02/11/1956, 66 y.o., female Today's Date: 10/24/2022   PT End of Session - 10/23/22 1414     Visit Number 10    Number of Visits 18    Date for PT Re-Evaluation 10/28/22    Authorization Type BCBS/F[employer]ER[employer] PPO    PT Start Time 1411   Pt arrived 26 mins late to Rx.  pt thought the treatment time was later.   PT Stop Time 1431    PT Time Calculation (min) 20 min    Activity Tolerance Patient tolerated treatment well    Behavior During Therapy [facility] for tasks assessed/performed                 Past Medical History:  Diagnosis Date   Anemia    Anxiety 2020   Arthritis    Asthma 2013-2014   Depression    Dyspnea 2013   Fibromyalgia 09/04/2014   GERD (gastroesophageal reflux disease)    Headache    History of hiatal hernia    Hypertension    Lumbar radicular pain 09/04/2014   Pre-diabetes    Sacroiliac joint pain    nonunion of left SI joint   Sleep apnea 2016   Spinal headache    trouble waking    TIA (transient ischemic attack)    Wears glasses    Past Surgical History:  Procedure Laterality Date   ABDOMINAL HYSTERECTOMY     BACK SURGERY  2002   BREAST LUMPECTOMY Left 1985   BREAST SURGERY     BUNIONECTOMY     COLONOSCOPY     DILATION AND CURETTAGE OF UTERUS     ESOPHAGEAL MANOMETRY [name] 11/21/2013   Procedure: ESOPHAGEAL MANOMETRY (EM);  Surgeon: Shirley Friar, MD;  Location: [facility];  Service: Endoscopy;  Laterality: [name];   KNEE ARTHROSCOPY W/ MENISCAL REPAIR     LUMBAR LAMINECTOMY     MULTIPLE TOOTH EXTRACTIONS     NASAL SINUS SURGERY     PAROTIDECTOMY Left 09/21/2020   Procedure: LEFT SUPERFICIAL PAROTIDECTOMY WITH NERVE MONITORING;  Surgeon: Osborn Coho, MD;  Location: [facility] OR;  Service: ENT;  Laterality: Left;   SACROILIAC JOINT FUSION Left 08/05/2018   Procedure: REVISION LEFT SACROILIAC JOINT FUSION WITH INSTRUMENTATION  AND ALLOGRAFT;  Surgeon: Estill Bamberg, MD;  Location: [facility] OR;  Service: Orthopedics;  Laterality: Left;   TONSILLECTOMY     Patient Active Problem List   Diagnosis Date Noted   Mass of left parotid gland 09/21/2020   Parotid mass 09/21/2020   De Quervain's tenosynovitis 06/22/2019   Trigger middle finger of right hand 04/27/2019   Stiffness of finger joint, right 04/27/2019   Xerostomia 02/23/2019   Dry eyes 02/23/2019   Family history of diabetes mellitus 01/26/2019   Family history of ischemic heart disease (IHD) 01/26/2019   Family history of stroke 01/26/2019   Hiatal hernia 01/26/2019   Knee pain, left 01/26/2019   Sacroiliac joint dysfunction 08/05/2018   Spondylosis of lumbar region without myelopathy or radiculopathy 05/16/2016   Generalized headaches 04/21/2016   History of migraine 04/21/2016   Menopause 04/09/2016   Benign hypertension 04/09/2016   Depressive disorder 04/09/2016   Disorder of thyroid gland 04/09/2016   Dysphagia 04/09/2016   Chronic low back pain with left-sided sciatica 03/24/2016   Asthmatic bronchitis 09/06/2014   GERD (gastroesophageal reflux disease) 09/06/2014   Allergic rhinitis 09/06/2014   Lumbar radicular pain 09/04/2014   Fibromyalgia 09/04/2014  Back pain 06/01/2013   Cubital tunnel syndrome 06/01/2013   Muscle spasms of neck 06/01/2013   Myofascial pain 06/01/2013   Neuropathic pain 06/01/2013   Right wrist pain 06/01/2013   Shoulder pain 06/01/2013   Primary localized osteoarthrosis, lower leg 12/08/2012   Encounter for routine gynecological examination 11/16/2012   Borderline hypertension 06/16/2012   Schatzki's ring 05/21/2012   G6PD deficiency 04/14/2012   Lumbar post-laminectomy syndrome 08/26/2011   SOB (shortness of breath) 04/27/2009    PCP: None  REFERRING PROVIDER: Henrine Screws, MD   REFERRING DIAG: Lumbago with sciatica, left side [M54.42], Other chronic pain [G89.29]  Rationale for Evaluation and  Treatment Rehabilitation  THERAPY DIAG:  Other low back pain  Muscle weakness (generalized)  Abnormal posture  ONSET DATE: February 2016.  SUBJECTIVE:                                                                                                                                                                                           SUBJECTIVE STATEMENT: Pt states she felt fine after prior Rx.  Pt was unable to have the eye surgery yesterday due to MD being sick.  Pt reports compliance with HEP.  She is not feeling it in her foot today.   Pt states she felt good after prior Rx.    PERTINENT HISTORY:  SI joint fusion in 2019 and SI joint revision in 2020 ; Lumbar laminectomy and foraminatomy in 2002 Spinal cord stimulator in June 2023 Anxiety, Depression, Fibromyalgia, HTN, Pre Diabetic, TIA, Scoliosis.   PAIN:  Are you having pain? Yes: NPRS scale: 6/10  Pain location:  L sided lumbar pain, L hip pain to post knee Pain description: Sharp pain, burning. Aggravating factors: Bending over, reaching OH, walking, standing, sitting.  Relieving factors: Opioids, rest, ice, heat, stimulator.   PRECAUTIONS: None  WEIGHT BEARING RESTRICTIONS: No  FALLS:  Has patient fallen in last 6 months? Yes. Number of falls 2, when stepping backwards onto a step, and when cleaning behind bedroom door and trying to move something out of the way. Both falls in posterior direction.   LIVING ENVIRONMENT: Lives with:  Lives with her mother who has dementia Lives in: House/apartment Stairs: Yes: External: 3 steps; none and 8 in back with railings.  Has following equipment at home:  Electric scooter if long distance, walking stick.   OCCUPATION: Disability due to scoring 30% on functional capacity evaluation.   PLOF: Independent  PATIENT GOALS: Pt would like to reduce her pain and be more functional.    OBJECTIVE:   DIAGNOSTIC FINDINGS:  None.     TODAY'S TREATMENT:  Therapeutic  Exercise: Supine alt UE/LE x12 reps Supine shoulder flex and ext with TrA x10 with 2# Supine SLR with contralateral UE extension 2x10 bilat with TrA S/L hip abduction with TrA 2x10  Pt received L HS stretch with passive AP's for nn flossing 3x30 sec on L.    PATIENT EDUCATION:  Education details:relevant anatomy, HEP, exercise form and rationale, and POC. Person educated: Patient Education method: Medical illustrator, verbal cues Education comprehension: verbalized understanding and returned demonstration, verbal cues required  HOME EXERCISE PROGRAM: Pt  is already performing LTR, bridges, supine HS stretch, and Piriformis stretch after reviewing. She denied a handout.  Access Code: [email or web address] URL: [email or web address] Date: 09/10/2022 Prepared by: Aaron Edelman  Exercises - Supine Transversus Abdominis Bracing - Hands on Stomach  - 2 x daily - 7 x weekly - 2 sets - 10 reps - 5 seconds hold - Supine March  - 1-2 x daily - 7 x weekly - 2 sets - 10 reps - Hooklying Clamshell with Resistance  - 1 x daily - 4 x weekly - 2 sets - 10 reps   ASSESSMENT:  CLINICAL IMPRESSION:  Treatment time limited today due to pt arriving late because she thought her treatment time was later.  Pt presents to Rx stating she doesn't feel it in her foot and the pain stops at post knee.  Pt performed core and hip exercises well.  Pt responded well to Rx.  She states her pain is about the same but her hip feels a little worse after the manual nerve flossing.  Pt states she will feel fine as she walks it off.  Pt should cont to benefit from cont skilled PT services to address ongoing goals and to improve overall function.    OBJECTIVE IMPAIRMENTS: decreased activity tolerance, difficulty walking, decreased balance, decreased endurance, decreased mobility, decreased ROM, decreased strength, impaired flexibility, impaired UE/LE use, postural dysfunction, and pain.  ACTIVITY LIMITATIONS:  bending, lifting, carry, locomotion, cleaning, community activity, driving, and or occupation  PERSONAL FACTORS: Anxiety, Depression, Fibromyalgia, HTN, Pre Diabetic, TIA are also affecting patient's functional outcome.  REHAB POTENTIAL: Good  CLINICAL DECISION MAKING: Stable/uncomplicated  EVALUATION COMPLEXITY: Low    GOALS: Short term PT Goals Target date: 09/23/22 Pt will be I and compliant with HEP. Baseline:  Goal status: New Pt will decrease pain by 25% overall Baseline: Goal status: New  Long term PT goals Target date: 10/21/22 Pt will improve ROM to [facility] to improve functional mobility Baseline: Goal status: New Pt will improve  hip/knee strength to at least 5-/5 MMT to improve functional strength Baseline: Goal status: New Pt will improve FOTO to at least 45% functional to show improved function Baseline: Goal status: New Pt will reduce pain by overall 50% overall with usual activity Baseline: Goal status: New Pt will reduce pain to overall less than 2-3/10 with usual activity and work activity. Baseline: Goal status: New Pt will be able to ambulate community distances at least 1000 ft WNL gait pattern without complaints Baseline: Goal status: New      7. Pt will improve her STS by 2.3 seconds for Minimal clinically important difference.         Baseline:         Goal status: New       8. Pt will improve TUG by 3.4 seconds or Minimal clinically important difference.        Baseline:        Goal status: New  PLAN: PT FREQUENCY: 1-3  times per week   PT DURATION: 6-8 weeks  PLANNED INTERVENTIONS (unless contraindicated): aquatic PT, Canalith repositioning, cryotherapy, Electrical stimulation, Iontophoresis with 4 mg/ml dexamethasome, Moist heat, traction, Ultrasound, gait training, Therapeutic exercise, balance training, neuromuscular re-education, patient/family education, prosthetic training, manual techniques, passive ROM, dry needling, taping,  vasopnuematic device, vestibular, spinal manipulations, joint manipulations  PLAN FOR NEXT SESSION:  Cont with aquatic and land based therapy.  continue to progress functional mobility, strengthen L LE, TrA, and glutes muscles. Decrease patients pain and help minimize functional deficits. Pt may seek referral for Lt shoulder (plans to see ortho Dr to assess).  Cont with STW and core strengthening.   Cont with supine manual HS stretch with passive AP's.   Pt is having eyelid surgery next week.  PT instructed to speak to MD to find out when she will be cleared to return to PT.     Corrie Dandy Tomma Lightning) Sheneka Schrom MPT 10/24/22 3:13 PM

## 2022-10-28 ENCOUNTER — Ambulatory Visit (HOSPITAL_BASED_OUTPATIENT_CLINIC_OR_DEPARTMENT_OTHER): Payer: Federal, State, Local not specified - PPO | Admitting: Physical Therapy

## 2022-10-30 ENCOUNTER — Ambulatory Visit (HOSPITAL_BASED_OUTPATIENT_CLINIC_OR_DEPARTMENT_OTHER): Payer: Federal, State, Local not specified - PPO | Admitting: Physical Therapy

## 2022-10-31 DIAGNOSIS — H02132 Senile ectropion of right lower eyelid: Secondary | ICD-10-CM | POA: Diagnosis not present

## 2022-10-31 DIAGNOSIS — H04222 Epiphora due to insufficient drainage, left lacrimal gland: Secondary | ICD-10-CM | POA: Diagnosis not present

## 2022-10-31 DIAGNOSIS — H02135 Senile ectropion of left lower eyelid: Secondary | ICD-10-CM | POA: Diagnosis not present

## 2022-10-31 DIAGNOSIS — H04552 Acquired stenosis of left nasolacrimal duct: Secondary | ICD-10-CM | POA: Diagnosis not present

## 2022-11-05 ENCOUNTER — Ambulatory Visit (HOSPITAL_BASED_OUTPATIENT_CLINIC_OR_DEPARTMENT_OTHER): Payer: Federal, State, Local not specified - PPO | Attending: Neurosurgery | Admitting: Physical Therapy

## 2022-11-05 ENCOUNTER — Encounter (HOSPITAL_BASED_OUTPATIENT_CLINIC_OR_DEPARTMENT_OTHER): Payer: Self-pay | Admitting: Physical Therapy

## 2022-11-05 DIAGNOSIS — R293 Abnormal posture: Secondary | ICD-10-CM

## 2022-11-05 DIAGNOSIS — M5459 Other low back pain: Secondary | ICD-10-CM

## 2022-11-05 DIAGNOSIS — M6281 Muscle weakness (generalized): Secondary | ICD-10-CM | POA: Diagnosis not present

## 2022-11-05 DIAGNOSIS — G8929 Other chronic pain: Secondary | ICD-10-CM | POA: Diagnosis not present

## 2022-11-05 DIAGNOSIS — M5442 Lumbago with sciatica, left side: Secondary | ICD-10-CM | POA: Diagnosis not present

## 2022-11-05 NOTE — Therapy (Signed)
OUTPATIENT PHYSICAL THERAPY THORACOLUMBAR/  Progress note/recertification  Progress Note Reporting Period 11/05/22 to 12/17/22  See note below for Objective Data and Assessment of Progress/Goals.     Patient Name: Ashley [name] MRN: [id number] DOB:September 11, 1956, 67 y.o., female Today's Date: 11/05/2022   PT End of Session - 11/05/22 1304     Visit Number 11    Number of Visits 23    Date for PT Re-Evaluation 12/17/22    Authorization Type BCBS/FEDERAL EMP PPO    Activity Tolerance Patient tolerated treatment well    Behavior During Therapy [facility] for tasks assessed/performed                 Past Medical History:  Diagnosis Date   Anemia    Anxiety 2020   Arthritis    Asthma 2013-2014   Depression    Dyspnea 2013   Fibromyalgia 09/04/2014   GERD (gastroesophageal reflux disease)    Headache    History of hiatal hernia    Hypertension    Lumbar radicular pain 09/04/2014   Pre-diabetes    Sacroiliac joint pain    nonunion of left SI joint   Sleep apnea 2016   Spinal headache    trouble waking    TIA (transient ischemic attack)    Wears glasses    Past Surgical History:  Procedure Laterality Date   ABDOMINAL HYSTERECTOMY     BACK SURGERY  2002   BREAST LUMPECTOMY Left 1985   BREAST SURGERY     BUNIONECTOMY     COLONOSCOPY     DILATION AND CURETTAGE OF UTERUS     ESOPHAGEAL MANOMETRY [name] 11/21/2013   Procedure: ESOPHAGEAL MANOMETRY (EM);  Surgeon: Lear Ng, MD;  Location: [facility];  Service: Endoscopy;  Laterality: [name];   KNEE ARTHROSCOPY W/ MENISCAL REPAIR     LUMBAR LAMINECTOMY     MULTIPLE TOOTH EXTRACTIONS     NASAL SINUS SURGERY     PAROTIDECTOMY Left 09/21/2020   Procedure: LEFT SUPERFICIAL PAROTIDECTOMY WITH NERVE MONITORING;  Surgeon: Jerrell Belfast, MD;  Location: [place];  Service: ENT;  Laterality: Left;   SACROILIAC JOINT FUSION Left 08/05/2018   Procedure: REVISION LEFT SACROILIAC JOINT FUSION WITH INSTRUMENTATION AND  ALLOGRAFT;  Surgeon: Phylliss Bob, MD;  Location: [place];  Service: Orthopedics;  Laterality: Left;   TONSILLECTOMY     Patient Active Problem List   Diagnosis Date Noted   Mass of left parotid gland 09/21/2020   Parotid mass 09/21/2020   De Quervain's tenosynovitis 06/22/2019   Trigger middle finger of right hand 04/27/2019   Stiffness of finger joint, right 04/27/2019   Xerostomia 02/23/2019   Dry eyes 02/23/2019   Family history of diabetes mellitus 01/26/2019   Family history of ischemic heart disease (IHD) 01/26/2019   Family history of stroke 01/26/2019   Hiatal hernia 01/26/2019   Knee pain, left 01/26/2019   Sacroiliac joint dysfunction 08/05/2018   Spondylosis of lumbar region without myelopathy or radiculopathy 05/16/2016   Generalized headaches 04/21/2016   History of migraine 04/21/2016   Menopause 04/09/2016   Benign hypertension 04/09/2016   Depressive disorder 04/09/2016   Disorder of thyroid gland 04/09/2016   Dysphagia 04/09/2016   Chronic low back pain with left-sided sciatica 03/24/2016   Asthmatic bronchitis 09/06/2014   GERD (gastroesophageal reflux disease) 09/06/2014   Allergic rhinitis 09/06/2014   Lumbar radicular pain 09/04/2014   Fibromyalgia 09/04/2014   Back pain 06/01/2013   Cubital tunnel syndrome 06/01/2013   Muscle spasms of  neck 06/01/2013   Myofascial pain 06/01/2013   Neuropathic pain 06/01/2013   Right wrist pain 06/01/2013   Shoulder pain 06/01/2013   Primary localized osteoarthrosis, lower leg 12/08/2012   Encounter for routine gynecological examination 11/16/2012   Borderline hypertension 06/16/2012   Schatzki's ring 05/21/2012   G6PD deficiency 04/14/2012   Lumbar post-laminectomy syndrome 08/26/2011   SOB (shortness of breath) 04/27/2009    PCP: None  REFERRING PROVIDER: Aura Dials, MD   REFERRING DIAG: Lumbago with sciatica, left side [M54.42], Other chronic pain [G89.29]  Rationale for Evaluation and Treatment  Rehabilitation  THERAPY DIAG:  Other low back pain - Plan: PT plan of care cert/re-cert  Muscle weakness (generalized) - Plan: PT plan of care cert/re-cert  Abnormal posture - Plan: PT plan of care cert/re-cert  Chronic left-sided low back pain with left-sided sciatica - Plan: PT plan of care cert/re-cert  ONSET DATE: February 2016.  SUBJECTIVE:                                                                                                                                                                                           SUBJECTIVE STATEMENT: Pt reports she has had ~40% decreased in pain.  Pain not in LB but continues some in left hip/buttock with radicular sx into foot.  When I exercises in the pool it stops just above knee    PERTINENT HISTORY:  SI joint fusion in 2019 and SI joint revision in 2020 ; Lumbar laminectomy and foraminatomy in 2002 Spinal cord stimulator in June 2023 Anxiety, Depression, Fibromyalgia, HTN, Pre Diabetic, TIA, Scoliosis.   PAIN:  Are you having pain? Yes: NPRS scale: 5-6/10  Pain location:  L sided lumbar pain, L hip pain to post knee Pain description: Sharp pain, burning. Aggravating factors: Bending over, reaching OH, walking, standing, sitting.  Relieving factors: Opioids, rest, ice, heat, stimulator.   PRECAUTIONS: None  WEIGHT BEARING RESTRICTIONS: No  FALLS:  Has patient fallen in last 6 months? Yes. Number of falls 2, when stepping backwards onto a step, and when cleaning behind bedroom door and trying to move something out of the way. Both falls in posterior direction.   LIVING ENVIRONMENT: Lives with:  Lives with her mother who has dementia Lives in: House/apartment Stairs: Yes: External: 3 steps; none and 8 in back with railings.  Has following equipment at home:  Electric scooter if long distance, walking stick.   OCCUPATION: Disability due to scoring 30% on functional capacity evaluation.   PLOF: Independent  PATIENT  GOALS: Pt would like to reduce her pain and be more functional.    OBJECTIVE:  DIAGNOSTIC FINDINGS:  None.   PATIENT SURVEYS:  FOTO 40.76%, 45% in 14 vitis.    SCREENING FOR RED FLAGS: Bowel or bladder incontinence: No   COGNITION: Overall cognitive status: Within functional limits for tasks assessed                          SENSATION: WFL     POSTURE: rounded shoulders, forward head, and increased lumbar lordosis   PALPATION: Entire L side of QL, Paraspinals and into glutes   LUMBAR ROM:    AROM eval 11/05/21  Flexion WFL, increases symptoms WFL no pain  Extension WFL   Right lateral flexion WFL   Left lateral flexion WFL - increases symptoms.  Wfl no pain  Right rotation WFL   Left rotation WFL    (Blank rows = not tested)   LOWER EXTREMITY ROM:      Active  Right eval Left eval Right /  Left 11/05/22  Hip flexion WFL WFL, increases symptoms Wfl  no pain  Hip extension       Hip abduction       Knee flexion [facility]   Knee extension [facility] WFL, increases symptoms Wfl no pain   (Blank rows = not tested)   LOWER EXTREMITY MMT:       P!=pain MMT Right eval Left eval Right / Left 11/05/22  Hip flexion 5+ 4 limited by pain 4 P!  Hip extension       Hip abduction     5 / 4  Knee flexion 5 4 limited by pain 5 / 4+ P!  Knee extension 5 4 limited by pain 5/ 4+ P!   (Blank rows = not tested)   LUMBAR SPECIAL TESTS:  Slump test: Positive   FUNCTIONAL TESTS:  5 times sit to stand: 19.97 seconds.  Timed up and go (TUG): 12.01 sec  11/05/22 5 x STS: 15.07 TUG: 10.37   GAIT: Distance walked: 50 ft Assistive device utilized: None Level of assistance: Complete Independence Comments: Pt walks with decreased step length and decreased weight on L LE.  11/05/22:pt reports she can walk for 10-15 minutes.  Amb without AD. Walks 500 ft from parking lot to pool with discomfort but not limited  TODAY'S TREATMENT:  Re-assess Objective testing  Pt seen for aquatic  therapy today.  Treatment took place in water 3.25-4.75 ft in depth at the [place]. Temp of water was 91.  Pt entered/exited the pool via stairs independently with bilat rail.    * in 4+ ft of water: unsupported walking forward/ backward / side stepping / high knee marching forward/ backward; *L stretch with "tail wagging" *QL stretch *Balance with core firing using hand bells LE staggered then together. Bilateral then unilateral hand bells. Oscillating in sagittal and frontal planes 4 sets of 10 ea position alternating rapid movement with slow. *TrA isometrics: feet staggered then wide stance pushing grren noodle 3-4 inches down holding for * straddling yellow noodle:  cycling with ue hand support   PATIENT EDUCATION:  Education details:relevant anatomy, HEP, exercise form and rationale, and POC. Person educated: Patient Education method: Customer service manager, verbal cues Education comprehension: verbalized understanding and returned demonstration, verbal cues required  HOME EXERCISE PROGRAM: Pt  is already performing LTR, bridges, supine HS stretch, and Piriformis stretch after reviewing. She denied a handout.  Access Code: [email or web address] URL: [email or web address] Date: 09/10/2022 Prepared by: Ronny Flurry  Exercises - Supine Transversus Abdominis Bracing -  Hands on Stomach  - 2 x daily - 7 x weekly - 2 sets - 10 reps - 5 seconds hold - Supine March  - 1-2 x daily - 7 x weekly - 2 sets - 10 reps - Hooklying Clamshell with Resistance  - 1 x daily - 4 x weekly - 2 sets - 10 reps   ASSESSMENT:  CLINICAL IMPRESSION:  PN: Pt demonstrates improvements in functional mobility and ADL's. Her objective measures are improved at noted in charts above. Overall pain has improved with little discomfort in mid LB but continues in L hip/buttocks area with radicular sx daily to foot.  She has met all of her STG and has progressed in most LTG, meeting 1.  She  continues to be limited in amb tolerance due to pain but her antalgic pattern is less, as she has improved stance time LLE along with longer stride. She will continue to benefit from skilled PT aquatic and land based using the properties of water to assist in reaching LTG's and land based therapy for safe and proper functioning going forward. FOTO to be completed next visit     OBJECTIVE IMPAIRMENTS: decreased activity tolerance, difficulty walking, decreased balance, decreased endurance, decreased mobility, decreased ROM, decreased strength, impaired flexibility, impaired UE/LE use, postural dysfunction, and pain.  ACTIVITY LIMITATIONS: bending, lifting, carry, locomotion, cleaning, community activity, driving, and or occupation  PERSONAL FACTORS: Anxiety, Depression, Fibromyalgia, HTN, Pre Diabetic, TIA are also affecting patient's functional outcome.  REHAB POTENTIAL: Good  CLINICAL DECISION MAKING: Stable/uncomplicated  EVALUATION COMPLEXITY: Low    GOALS: Short term PT Goals Target date: 09/23/22 Pt will be I and compliant with HEP. Baseline:  Goal status: Met Pt will decrease pain by 25% overall Baseline: Goal status: Met  Long term PT goals Target date: 10/21/22 Pt will improve ROM to [facility] to improve functional mobility Baseline: Goal status: Met Pt will improve  hip/knee strength to at least 5-/5 MMT to improve functional strength Baseline: Goal status: New Pt will improve FOTO to at least 45% functional to show improved function Baseline: Goal status: New Pt will reduce pain by overall 50% overall with usual activity Baseline: Goal status: Ongoing Pt will reduce pain to overall less than 2-3/10 with usual activity and work activity. Baseline: Goal status: New Pt will be able to ambulate community distances at least 1000 ft WNL gait pattern without complaints Baseline: Goal status: New      7. Pt will improve her STS by 2.3 seconds for Minimal clinically  important difference.         Baseline:         Goal status: New       8. Pt will improve TUG by 3.4 seconds or Minimal clinically important difference.        Baseline:        Goal status: New  PLAN: PT FREQUENCY: 1-2x week  PT DURATION: 6 weeks  PLANNED INTERVENTIONS (unless contraindicated): aquatic PT, Canalith repositioning, cryotherapy, Electrical stimulation, Iontophoresis with 4 mg/ml dexamethasome, Moist heat, traction, Ultrasound, gait training, Therapeutic exercise, balance training, neuromuscular re-education, patient/family education, prosthetic training, manual techniques, passive ROM, dry needling, taping, vasopnuematic device, vestibular, spinal manipulations, joint manipulations  PLAN FOR NEXT SESSION:  Cont with aquatic and land based therapy.  continue to progress functional mobility, strengthen L LE, TrA, and glutes muscles. Decrease patients pain and help minimize functional deficits. Pt may seek referral for Lt shoulder (plans to see ortho Dr to assess).  Cont with  STW and core strengthening.   Cont with supine manual HS stretch with passive AP's.    Stanton Kidney (Frankie) Keith Cancio MPT  11/05/22 120p

## 2022-11-07 ENCOUNTER — Other Ambulatory Visit: Payer: Self-pay

## 2022-11-07 ENCOUNTER — Telehealth (HOSPITAL_BASED_OUTPATIENT_CLINIC_OR_DEPARTMENT_OTHER): Payer: Self-pay | Admitting: Physical Therapy

## 2022-11-07 ENCOUNTER — Ambulatory Visit (HOSPITAL_BASED_OUTPATIENT_CLINIC_OR_DEPARTMENT_OTHER): Payer: Federal, State, Local not specified - PPO | Admitting: Physical Therapy

## 2022-11-07 ENCOUNTER — Encounter (HOSPITAL_BASED_OUTPATIENT_CLINIC_OR_DEPARTMENT_OTHER): Payer: Self-pay | Admitting: Orthopedic Surgery

## 2022-11-07 DIAGNOSIS — J069 Acute upper respiratory infection, unspecified: Secondary | ICD-10-CM | POA: Diagnosis not present

## 2022-11-07 NOTE — Progress Notes (Signed)
Reviewed case with Dr Lissa Hoard who wants patient to be free from cold symptoms for a minimum of 2 weeks before having surgery. Will call patient to notify; will notify Dr Levell July office

## 2022-11-07 NOTE — Progress Notes (Signed)
   11/07/22 1046  Pre-op Phone Call  Surgery Date Verified 11/13/22  Arrival Time Verified 0815  Surgery Location Verified [facility] [place]  Medical History Reviewed Yes  Is the patient taking a GLP-1 receptor agonist? No  Does the patient have diabetes? Pre-diabetes  Do you have a history of heart problems? No  Antiarrhythmic device type (S)   ((spinal cord stimulator, pt to bring remote))  Patient educated on enhanced recovery. Yes  Patient educated about smoking cessation 24 hours prior to surgery. [name] Non-Smoker  Med Rec Completed Yes  Take the Following Meds the Morning of Surgery protonix, gabapentin with sip water, symbocort  Recent  Lab Work, EKG, CXR? No  NPO (Including gum & candy) After midnight  Patient instructed to stop clear liquids including Carb loading drink at: 0615  Stop Solids, Milk, Candy, and Gum STARTING AT MIDNIGHT  Responsible adult to drive and be with you for 24 hours? Yes  Name & Phone Number for Ride/Caregiver either sister or friend (pt aware transportation and 24 care are necessary)  No Jewelry, money, nail polish or make-up.  No lotions, powders, perfumes. No shaving  48 hrs. prior to surgery. Yes  Contacts, Dentures & Glasses Will Have to be Removed Before OR. Yes  Please bring your ID and Insurance Card the morning of your surgery. (Surgery Centers Only) Yes  Bring any papers or x-rays with you that your surgeon gave you. Yes  Instructed to contact the location of procedure/ provider if they or anyone in their household develops symptoms or tests positive for COVID-19, has close contact with someone who tests positive for COVID, or has known exposure to any contagious illness. Yes  Call this number the morning of surgery  with any problems that may cancel your surgery. [phone]  Covid-19 Assessment  Have you had a positive COVID-19 test within the previous 90 days? No  COVID Testing Guidance Proceed with the additional questions.  Patient's surgery required a  COVID-19 test (cardiothoracic, complex ENT, and bronchoscopies/ EBUS) No  Have you been unmasked and in close contact with anyone with COVID-19 or COVID-19 symptoms within the past 10 days? No  Do you or anyone in your household currently have any COVID-19 symptoms? (S)  Yes (pt currently has cold/cough, runny nose, sore throat, NO fever. Went to MD yesterday who believes it will be resolved in next few days. Pt will come day before surgery for anesthesia consult to confirm she is well enough for surgery.)  COVID Testing Guidance Test, treat as PUI until test is resulted (airborne/contact isolation), and notify anesthesiologist/surgeon to determine if surgery needs to be delayed.

## 2022-11-07 NOTE — Telephone Encounter (Signed)
No-show for today's appointment. Called and spoke to patient, she is currently ill and not feeling well enough to come into PT, apologizes as she forgot to call to cancel. Educated on cancel and no-show policy for [facility] as well, reminded her of date/time of next scheduled session but did ask her to call us if she is still not feeling well enough to come to next appt.    Deniece Ree PT DPT PN2

## 2022-11-12 ENCOUNTER — Encounter (HOSPITAL_BASED_OUTPATIENT_CLINIC_OR_DEPARTMENT_OTHER): Payer: Self-pay | Admitting: Physical Therapy

## 2022-11-12 ENCOUNTER — Ambulatory Visit (HOSPITAL_BASED_OUTPATIENT_CLINIC_OR_DEPARTMENT_OTHER): Payer: Federal, State, Local not specified - PPO | Admitting: Physical Therapy

## 2022-11-12 DIAGNOSIS — M5459 Other low back pain: Secondary | ICD-10-CM

## 2022-11-12 DIAGNOSIS — M6281 Muscle weakness (generalized): Secondary | ICD-10-CM

## 2022-11-12 DIAGNOSIS — M5442 Lumbago with sciatica, left side: Secondary | ICD-10-CM | POA: Diagnosis not present

## 2022-11-12 DIAGNOSIS — G8929 Other chronic pain: Secondary | ICD-10-CM

## 2022-11-12 DIAGNOSIS — R293 Abnormal posture: Secondary | ICD-10-CM | POA: Diagnosis not present

## 2022-11-12 NOTE — Therapy (Addendum)
OUTPATIENT PHYSICAL THERAPY THORACOLUMBAR TREATMENT Patient Name: Ashley [name] MRN: [id number] DOB:Mar 20, 1956, 67 y.o., female Today's Date: 11/12/2022   PT End of Session - 11/12/22 1647     Visit Number 12    Number of Visits 23    Date for PT Re-Evaluation 12/17/22    Authorization Type [employer] PPO    PT Start Time 1633    PT Stop Time 1715    PT Time Calculation (min) 42 min    Activity Tolerance Patient tolerated treatment well    Behavior During Therapy [facility] for tasks assessed/performed                 Past Medical History:  Diagnosis Date   Anemia    Anxiety 2020   Arthritis    Asthma 2013-2014   Depression    Dyspnea 2013   Fibromyalgia 09/04/2014   GERD (gastroesophageal reflux disease)    Glucose-6-phosphate dehydrogenase deficiency    Headache    History of hiatal hernia    Hypertension    Lumbar radicular pain 09/04/2014   Pre-diabetes    Sacroiliac joint pain    nonunion of left SI joint   Sleep apnea 2016   Spinal headache    trouble waking    TIA (transient ischemic attack)    Wears glasses    Past Surgical History:  Procedure Laterality Date   ABDOMINAL HYSTERECTOMY     BACK SURGERY  2002   BREAST LUMPECTOMY Left 1985   BREAST SURGERY     BUNIONECTOMY     COLONOSCOPY     DILATION AND CURETTAGE OF UTERUS     ESOPHAGEAL MANOMETRY [name] 11/21/2013   Procedure: ESOPHAGEAL MANOMETRY (EM);  Surgeon: Shirley Friar, MD;  Location: [facility];  Service: Endoscopy;  Laterality: [name];   KNEE ARTHROSCOPY W/ MENISCAL REPAIR     LUMBAR LAMINECTOMY     MULTIPLE TOOTH EXTRACTIONS     NASAL SINUS SURGERY     PAROTIDECTOMY Left 09/21/2020   Procedure: LEFT SUPERFICIAL PAROTIDECTOMY WITH NERVE MONITORING;  Surgeon: Osborn Coho, MD;  Location: [facility] OR;  Service: ENT;  Laterality: Left;   SACROILIAC JOINT FUSION Left 08/05/2018   Procedure: REVISION LEFT SACROILIAC JOINT FUSION WITH INSTRUMENTATION AND ALLOGRAFT;  Surgeon:  Estill Bamberg, MD;  Location: [facility] OR;  Service: Orthopedics;  Laterality: Left;   SPINAL CORD STIMULATOR IMPLANT     TONSILLECTOMY     Patient Active Problem List   Diagnosis Date Noted   Mass of left parotid gland 09/21/2020   Parotid mass 09/21/2020   De Quervain's tenosynovitis 06/22/2019   Trigger middle finger of right hand 04/27/2019   Stiffness of finger joint, right 04/27/2019   Xerostomia 02/23/2019   Dry eyes 02/23/2019   Family history of diabetes mellitus 01/26/2019   Family history of ischemic heart disease (IHD) 01/26/2019   Family history of stroke 01/26/2019   Hiatal hernia 01/26/2019   Knee pain, left 01/26/2019   Sacroiliac joint dysfunction 08/05/2018   Spondylosis of lumbar region without myelopathy or radiculopathy 05/16/2016   Generalized headaches 04/21/2016   History of migraine 04/21/2016   Menopause 04/09/2016   Benign hypertension 04/09/2016   Depressive disorder 04/09/2016   Disorder of thyroid gland 04/09/2016   Dysphagia 04/09/2016   Chronic low back pain with left-sided sciatica 03/24/2016   Asthmatic bronchitis 09/06/2014   GERD (gastroesophageal reflux disease) 09/06/2014   Allergic rhinitis 09/06/2014   Lumbar radicular pain 09/04/2014   Fibromyalgia 09/04/2014   Back pain  06/01/2013   Cubital tunnel syndrome 06/01/2013   Muscle spasms of neck 06/01/2013   Myofascial pain 06/01/2013   Neuropathic pain 06/01/2013   Right wrist pain 06/01/2013   Shoulder pain 06/01/2013   Primary localized osteoarthrosis, lower leg 12/08/2012   Encounter for routine gynecological examination 11/16/2012   Borderline hypertension 06/16/2012   Schatzki's ring 05/21/2012   G6PD deficiency 04/14/2012   Lumbar post-laminectomy syndrome 08/26/2011   SOB (shortness of breath) 04/27/2009    PCP: None  REFERRING PROVIDER: Aura Dials, MD   REFERRING DIAG: Lumbago with sciatica, left side [M54.42], Other chronic pain [G89.29]  Rationale for Evaluation  and Treatment Rehabilitation  THERAPY DIAG:  Other low back pain  Muscle weakness (generalized)  Abnormal posture  Chronic left-sided low back pain with left-sided sciatica  ONSET DATE: February 2016.  SUBJECTIVE:                                                                                                                                                                                           SUBJECTIVE STATEMENT: Pt reports no changes.     PERTINENT HISTORY:  SI joint fusion in 2019 and SI joint revision in 2020 ; Lumbar laminectomy and foraminatomy in 2002 Spinal cord stimulator in June 2023 Anxiety, Depression, Fibromyalgia, HTN, Pre Diabetic, TIA, Scoliosis.   PAIN:  Are you having pain? Yes: NPRS scale: 5-6/10  Pain location:  L sided lumbar pain, L hip pain to post knee Pain description: Sharp pain, burning. Aggravating factors: Bending over, reaching OH, walking, standing, sitting.  Relieving factors: Opioids, rest, ice, heat, stimulator.   PRECAUTIONS: None  WEIGHT BEARING RESTRICTIONS: No  FALLS:  Has patient fallen in last 6 months? Yes. Number of falls 2, when stepping backwards onto a step, and when cleaning behind bedroom door and trying to move something out of the way. Both falls in posterior direction.   LIVING ENVIRONMENT: Lives with:  Lives with her mother who has dementia Lives in: House/apartment Stairs: Yes: External: 3 steps; none and 8 in back with railings.  Has following equipment at home:  Electric scooter if long distance, walking stick.   OCCUPATION: Disability due to scoring 30% on functional capacity evaluation.   PLOF: Independent  PATIENT GOALS: Pt would like to reduce her pain and be more functional.    OBJECTIVE:   DIAGNOSTIC FINDINGS:  None.   PATIENT SURVEYS:  FOTO 40.76%, 45% in 14 vitis.  11/12/22: 41%   SCREENING FOR RED FLAGS: Bowel or bladder incontinence: No   COGNITION: Overall cognitive status: Within  functional limits for tasks assessed  SENSATION: WFL     POSTURE: rounded shoulders, forward head, and increased lumbar lordosis   PALPATION: Entire L side of QL, Paraspinals and into glutes   LUMBAR ROM:    AROM eval 11/05/21  Flexion WFL, increases symptoms WFL no pain  Extension WFL   Right lateral flexion WFL   Left lateral flexion WFL - increases symptoms.  Wfl no pain  Right rotation WFL   Left rotation WFL    (Blank rows = not tested)   LOWER EXTREMITY ROM:      Active  Right eval Left eval Right /  Left 11/05/22  Hip flexion WFL WFL, increases symptoms Wfl  no pain  Hip extension       Hip abduction       Knee flexion [facility]   Knee extension [facility] WFL, increases symptoms Wfl no pain   (Blank rows = not tested)   LOWER EXTREMITY MMT:       P!=pain MMT Right eval Left eval Right / Left 11/05/22  Hip flexion 5+ 4 limited by pain 4 P!  Hip extension       Hip abduction     5 / 4  Knee flexion 5 4 limited by pain 5 / 4+ P!  Knee extension 5 4 limited by pain 5/ 4+ P!   (Blank rows = not tested)   LUMBAR SPECIAL TESTS:  Slump test: Positive   FUNCTIONAL TESTS:  5 times sit to stand: 19.97 seconds.  Timed up and go (TUG): 12.01 sec  11/05/22 5 x STS: 15.07 TUG: 10.37   GAIT: Distance walked: 50 ft Assistive device utilized: None Level of assistance: Complete Independence Comments: Pt walks with decreased step length and decreased weight on L LE.  11/05/22:pt reports she can walk for 10-15 minutes.  Amb without AD. Walks 500 ft from parking lot to pool with discomfort but not limited  TODAY'S TREATMENT:  Pt seen for aquatic therapy today.  Treatment took place in water 3.25-4.75 ft in depth at the [facility] pool. Temp of water was 91.  Pt entered/exited the pool via stairs independently with bilat rail.    * in 4+ ft of water: unsupported walking forward/ backward / side stepping / high knee marching forward/  backward; *L stretch with "tail wagging" *QL stretch *Balance with core firing using hand bells LE staggered then together. Bilateral then unilateral hand bells. Oscillating in sagittal and frontal planes 4 sets of 10 ea position alternating rapid movement with slow. *proprioceptive retraining/ankle stretegy strengthening: using ankle fins LE oscillation in frontal and sagittal plane 2 sets of rapid vs slow movement 10 *TrA isometrics: feet staggered then wide stance pushing grren noodle 3-4 inches down holding for * straddling yellow noodle:  cycling with ue hand support  Foto completed  PATIENT EDUCATION:  Education details:relevant anatomy, HEP, exercise form and rationale, and POC. Person educated: Patient Education method: Medical illustrator, verbal cues Education comprehension: verbalized understanding and returned demonstration, verbal cues required  HOME EXERCISE PROGRAM: Pt  is already performing LTR, bridges, supine HS stretch, and Piriformis stretch after reviewing. She denied a handout.  Access Code: [email or web address] URL: [email or web address] Date: 09/10/2022 Prepared by: Aaron Edelman  Exercises - Supine Transversus Abdominis Bracing - Hands on Stomach  - 2 x daily - 7 x weekly - 2 sets - 10 reps - 5 seconds hold - Supine March  - 1-2 x daily - 7 x weekly - 2 sets - 10 reps - Hooklying Clamshell  with Resistance  - 1 x daily - 4 x weekly - 2 sets - 10 reps   ASSESSMENT:  CLINICAL IMPRESSION:  Pt tolerates progression of balance and added proprioceptive retraining well.  Requires extra time to gain motor plan for execution using ankle fins. Once attained she completes well. She puts forth excellent effort gaining aerobic capacity element as well. Goals ongoing    PN: Pt demonstrates improvements in functional mobility and ADL's. Her objective measures are improved at noted in charts above. Overall pain has improved with little discomfort in mid LB but  continues in L hip/buttocks area with radicular sx daily to foot.  She has met all of her STG and has progressed in most LTG, meeting 1.  She continues to be limited in amb tolerance due to pain but her antalgic pattern is less, as she has improved stance time LLE along with longer stride. She will continue to benefit from skilled PT aquatic and land based using the properties of water to assist in reaching LTG's and land based therapy for safe and proper functioning going forward. FOTO to be completed next visit     OBJECTIVE IMPAIRMENTS: decreased activity tolerance, difficulty walking, decreased balance, decreased endurance, decreased mobility, decreased ROM, decreased strength, impaired flexibility, impaired UE/LE use, postural dysfunction, and pain.  ACTIVITY LIMITATIONS: bending, lifting, carry, locomotion, cleaning, community activity, driving, and or occupation  PERSONAL FACTORS: Anxiety, Depression, Fibromyalgia, HTN, Pre Diabetic, TIA are also affecting patient's functional outcome.  REHAB POTENTIAL: Good  CLINICAL DECISION MAKING: Stable/uncomplicated  EVALUATION COMPLEXITY: Low    GOALS: Short term PT Goals Target date: 09/23/22 Pt will be I and compliant with HEP. Baseline:  Goal status: Met Pt will decrease pain by 25% overall Baseline: Goal status: Met  Long term PT goals Target date: 10/21/22 Pt will improve ROM to [facility] to improve functional mobility Baseline: Goal status: Met Pt will improve  hip/knee strength to at least 5-/5 MMT to improve functional strength Baseline: Goal status: New Pt will improve FOTO to at least 45% functional to show improved function Baseline: Goal status: New Pt will reduce pain by overall 50% overall with usual activity Baseline: Goal status: Ongoing Pt will reduce pain to overall less than 2-3/10 with usual activity and work activity. Baseline: Goal status: New Pt will be able to ambulate community distances at least 1000 ft  WNL gait pattern without complaints Baseline: Goal status: New      7. Pt will improve her STS by 2.3 seconds for Minimal clinically important difference.         Baseline:         Goal status: New       8. Pt will improve TUG by 3.4 seconds or Minimal clinically important difference.        Baseline:        Goal status: New  PLAN: PT FREQUENCY: 1-2x week  PT DURATION: 6 weeks  PLANNED INTERVENTIONS (unless contraindicated): aquatic PT, Canalith repositioning, cryotherapy, Electrical stimulation, Iontophoresis with 4 mg/ml dexamethasome, Moist heat, traction, Ultrasound, gait training, Therapeutic exercise, balance training, neuromuscular re-education, patient/family education, prosthetic training, manual techniques, passive ROM, dry needling, taping, vasopnuematic device, vestibular, spinal manipulations, joint manipulations  PLAN FOR NEXT SESSION:  Cont with aquatic and land based therapy.  continue to progress functional mobility, strengthen L LE, TrA, and glutes muscles. Decrease patients pain and help minimize functional deficits. Pt may seek referral for Lt shoulder (plans to see ortho Dr to assess).  Cont  with STW and core strengthening.   Cont with supine manual HS stretch with passive AP's.    Corrie Dandy [facility]) Mickel Schreur MPT  11/12/22 518p  Addend Corrie Dandy [facility]) Daishia Fetterly MPT 11/12/22 610p

## 2022-11-14 ENCOUNTER — Ambulatory Visit (HOSPITAL_BASED_OUTPATIENT_CLINIC_OR_DEPARTMENT_OTHER): Payer: Federal, State, Local not specified - PPO | Admitting: Physical Therapy

## 2022-11-14 ENCOUNTER — Telehealth (HOSPITAL_BASED_OUTPATIENT_CLINIC_OR_DEPARTMENT_OTHER): Payer: Self-pay | Admitting: Physical Therapy

## 2022-11-14 ENCOUNTER — Encounter (HOSPITAL_BASED_OUTPATIENT_CLINIC_OR_DEPARTMENT_OTHER): Payer: Self-pay | Admitting: Physical Therapy

## 2022-11-14 NOTE — Telephone Encounter (Signed)
No-show for today's PT appointment. Tried to reach her by phone but the line was not in service, so sent her a message detailing no-show as well as 3 no show policy. From this point on, per [facility] policy, she will schedule PT services one visit at a time.    Deniece Ree PT DPT PN2

## 2022-11-16 ENCOUNTER — Other Ambulatory Visit: Payer: Self-pay | Admitting: Psychiatry

## 2022-11-18 ENCOUNTER — Encounter (HOSPITAL_BASED_OUTPATIENT_CLINIC_OR_DEPARTMENT_OTHER): Payer: Self-pay | Admitting: Orthopedic Surgery

## 2022-11-18 ENCOUNTER — Encounter (HOSPITAL_BASED_OUTPATIENT_CLINIC_OR_DEPARTMENT_OTHER)
Admission: RE | Admit: 2022-11-18 | Discharge: 2022-11-18 | Disposition: A | Discharge status: Home / Self Care | Payer: Federal, State, Local not specified - PPO | Source: Ambulatory Visit | Attending: Orthopedic Surgery | Admitting: Orthopedic Surgery

## 2022-11-18 DIAGNOSIS — Z7951 Long term (current) use of inhaled steroids: Secondary | ICD-10-CM | POA: Diagnosis not present

## 2022-11-18 DIAGNOSIS — D75A Glucose-6-phosphate dehydrogenase (G6PD) deficiency without anemia: Secondary | ICD-10-CM | POA: Diagnosis not present

## 2022-11-18 DIAGNOSIS — Z87891 Personal history of nicotine dependence: Secondary | ICD-10-CM | POA: Diagnosis not present

## 2022-11-18 DIAGNOSIS — Z7984 Long term (current) use of oral hypoglycemic drugs: Secondary | ICD-10-CM | POA: Diagnosis not present

## 2022-11-18 DIAGNOSIS — F419 Anxiety disorder, unspecified: Secondary | ICD-10-CM | POA: Diagnosis not present

## 2022-11-18 DIAGNOSIS — Z01818 Encounter for other preprocedural examination: Secondary | ICD-10-CM | POA: Insufficient documentation

## 2022-11-18 DIAGNOSIS — Z79899 Other long term (current) drug therapy: Secondary | ICD-10-CM | POA: Diagnosis not present

## 2022-11-18 DIAGNOSIS — R7303 Prediabetes: Secondary | ICD-10-CM | POA: Diagnosis not present

## 2022-11-18 DIAGNOSIS — I10 Essential (primary) hypertension: Secondary | ICD-10-CM | POA: Insufficient documentation

## 2022-11-18 DIAGNOSIS — K219 Gastro-esophageal reflux disease without esophagitis: Secondary | ICD-10-CM | POA: Diagnosis not present

## 2022-11-18 DIAGNOSIS — F32A Depression, unspecified: Secondary | ICD-10-CM | POA: Diagnosis not present

## 2022-11-18 DIAGNOSIS — M797 Fibromyalgia: Secondary | ICD-10-CM | POA: Diagnosis not present

## 2022-11-18 DIAGNOSIS — M65331 Trigger finger, right middle finger: Secondary | ICD-10-CM | POA: Diagnosis not present

## 2022-11-18 DIAGNOSIS — G4730 Sleep apnea, unspecified: Secondary | ICD-10-CM | POA: Diagnosis not present

## 2022-11-18 DIAGNOSIS — J45909 Unspecified asthma, uncomplicated: Secondary | ICD-10-CM | POA: Diagnosis not present

## 2022-11-18 LAB — BASIC METABOLIC PANEL
Anion gap: 10 (ref 5–15)
BUN: 8 mg/dL (ref 8–23)
CO2: 30 mmol/L (ref 22–32)
Calcium: 9.1 mg/dL (ref 8.9–10.3)
Chloride: 98 mmol/L (ref 98–111)
Creatinine, Ser: 0.83 mg/dL (ref 0.44–1.00)
GFR, Estimated: >60 mL/min (ref >60)
Glucose, Bld: 95 mg/dL (ref 70–99)
Potassium: 3.7 mmol/L (ref 3.5–5.1)
Sodium: 138 mmol/L (ref 135–145)

## 2022-11-18 NOTE — Progress Notes (Signed)

## 2022-11-19 ENCOUNTER — Ambulatory Visit (HOSPITAL_BASED_OUTPATIENT_CLINIC_OR_DEPARTMENT_OTHER): Payer: Federal, State, Local not specified - PPO | Admitting: Physical Therapy

## 2022-11-19 ENCOUNTER — Ambulatory Visit: Payer: Federal, State, Local not specified - PPO | Admitting: Emergency Medicine

## 2022-11-19 DIAGNOSIS — Q069 Congenital malformation of spinal cord, unspecified: Secondary | ICD-10-CM | POA: Diagnosis not present

## 2022-11-19 DIAGNOSIS — Z9689 Presence of other specified functional implants: Secondary | ICD-10-CM | POA: Diagnosis not present

## 2022-11-19 DIAGNOSIS — E785 Hyperlipidemia, unspecified: Secondary | ICD-10-CM | POA: Diagnosis not present

## 2022-11-19 DIAGNOSIS — Z79899 Other long term (current) drug therapy: Secondary | ICD-10-CM | POA: Diagnosis not present

## 2022-11-19 DIAGNOSIS — R7303 Prediabetes: Secondary | ICD-10-CM | POA: Diagnosis not present

## 2022-11-20 ENCOUNTER — Encounter (HOSPITAL_BASED_OUTPATIENT_CLINIC_OR_DEPARTMENT_OTHER)
Admission: RE | Disposition: A | Discharge status: Home / Self Care | Payer: Self-pay | Source: Ambulatory Visit | Attending: Orthopedic Surgery

## 2022-11-20 ENCOUNTER — Ambulatory Visit (HOSPITAL_BASED_OUTPATIENT_CLINIC_OR_DEPARTMENT_OTHER): Payer: Federal, State, Local not specified - PPO | Admitting: Anesthesiology

## 2022-11-20 ENCOUNTER — Ambulatory Visit (HOSPITAL_BASED_OUTPATIENT_CLINIC_OR_DEPARTMENT_OTHER)
Admission: RE | Admit: 2022-11-20 | Discharge: 2022-11-20 | Disposition: A | Discharge status: Home / Self Care | Payer: Federal, State, Local not specified - PPO | Source: Ambulatory Visit | Attending: Orthopedic Surgery | Admitting: Orthopedic Surgery

## 2022-11-20 ENCOUNTER — Ambulatory Visit (HOSPITAL_BASED_OUTPATIENT_CLINIC_OR_DEPARTMENT_OTHER): Payer: Federal, State, Local not specified - PPO

## 2022-11-20 ENCOUNTER — Encounter (HOSPITAL_BASED_OUTPATIENT_CLINIC_OR_DEPARTMENT_OTHER): Payer: Self-pay | Admitting: Orthopedic Surgery

## 2022-11-20 DIAGNOSIS — R7303 Prediabetes: Secondary | ICD-10-CM | POA: Diagnosis not present

## 2022-11-20 DIAGNOSIS — Z7951 Long term (current) use of inhaled steroids: Secondary | ICD-10-CM | POA: Diagnosis not present

## 2022-11-20 DIAGNOSIS — M65331 Trigger finger, right middle finger: Secondary | ICD-10-CM | POA: Insufficient documentation

## 2022-11-20 DIAGNOSIS — I10 Essential (primary) hypertension: Secondary | ICD-10-CM | POA: Diagnosis not present

## 2022-11-20 DIAGNOSIS — F419 Anxiety disorder, unspecified: Secondary | ICD-10-CM | POA: Diagnosis not present

## 2022-11-20 DIAGNOSIS — M797 Fibromyalgia: Secondary | ICD-10-CM | POA: Insufficient documentation

## 2022-11-20 DIAGNOSIS — M24841 Other specific joint derangements of right hand, not elsewhere classified: Secondary | ICD-10-CM | POA: Diagnosis not present

## 2022-11-20 DIAGNOSIS — J45909 Unspecified asthma, uncomplicated: Secondary | ICD-10-CM | POA: Diagnosis not present

## 2022-11-20 DIAGNOSIS — D75A Glucose-6-phosphate dehydrogenase (G6PD) deficiency without anemia: Secondary | ICD-10-CM | POA: Insufficient documentation

## 2022-11-20 DIAGNOSIS — R030 Elevated blood-pressure reading, without diagnosis of hypertension: Secondary | ICD-10-CM

## 2022-11-20 DIAGNOSIS — G4730 Sleep apnea, unspecified: Secondary | ICD-10-CM | POA: Diagnosis not present

## 2022-11-20 DIAGNOSIS — Z7984 Long term (current) use of oral hypoglycemic drugs: Secondary | ICD-10-CM | POA: Insufficient documentation

## 2022-11-20 DIAGNOSIS — Z87891 Personal history of nicotine dependence: Secondary | ICD-10-CM | POA: Diagnosis not present

## 2022-11-20 DIAGNOSIS — F32A Depression, unspecified: Secondary | ICD-10-CM | POA: Diagnosis not present

## 2022-11-20 DIAGNOSIS — Z79899 Other long term (current) drug therapy: Secondary | ICD-10-CM | POA: Diagnosis not present

## 2022-11-20 DIAGNOSIS — K219 Gastro-esophageal reflux disease without esophagitis: Secondary | ICD-10-CM | POA: Insufficient documentation

## 2022-11-20 DIAGNOSIS — Z01818 Encounter for other preprocedural examination: Secondary | ICD-10-CM

## 2022-11-20 HISTORY — PX: FINGER ARTHROPLASTY: SHX5017

## 2022-11-20 HISTORY — PX: HAND TENODESIS: SHX6374

## 2022-11-20 HISTORY — DX: Glucose-6-phosphate dehydrogenase (G6PD) deficiency without anemia: D75.A

## 2022-11-20 LAB — GLUCOSE, CAPILLARY
Glucose-Capillary: 94 mg/dL (ref 70–99)
Glucose-Capillary: 110 mg/dL — ABNORMAL HIGH (ref 70–99)

## 2022-11-20 SURGERY — HAND TENODESIS
Anesthesia: Monitor Anesthesia Care | Site: Finger | Laterality: Right

## 2022-11-20 MED ORDER — MIDAZOLAM HCL 5 MG/5ML IJ SOLN
INTRAMUSCULAR | Status: DC | PRN
  Administered 2022-11-20: 1 mg via INTRAVENOUS

## 2022-11-20 MED ORDER — LIDOCAINE 2% (20 MG/ML) 5 ML SYRINGE
INTRAMUSCULAR | Status: AC
  Filled 2022-11-20: qty 5

## 2022-11-20 MED ORDER — MIDAZOLAM HCL 2 MG/2ML IJ SOLN
INTRAMUSCULAR | Status: AC
  Filled 2022-11-20: qty 2

## 2022-11-20 MED ORDER — CEFAZOLIN SODIUM-DEXTROSE 2-4 GM/100ML-% IV SOLN
INTRAVENOUS | Status: AC
  Filled 2022-11-20: qty 100

## 2022-11-20 MED ORDER — FENTANYL CITRATE (PF) 100 MCG/2ML IJ SOLN: 25.0000 ug | INTRAMUSCULAR | Status: DC | PRN

## 2022-11-20 MED ORDER — OXYMETAZOLINE HCL 0.05 % NA SOLN
1.0000 | Freq: Two times a day (BID) | NASAL | Status: DC | PRN
  Administered 2022-11-20: 1 via NASAL

## 2022-11-20 MED ORDER — ONDANSETRON HCL 4 MG/2ML IJ SOLN
INTRAMUSCULAR | Status: DC | PRN
  Administered 2022-11-20: 4 mg via INTRAVENOUS

## 2022-11-20 MED ORDER — LACTATED RINGERS IV SOLN: INTRAVENOUS | Status: DC

## 2022-11-20 MED ORDER — CEFAZOLIN SODIUM-DEXTROSE 2-4 GM/100ML-% IV SOLN
2.0000 g | INTRAVENOUS | Status: AC
  Administered 2022-11-20: 2 g via INTRAVENOUS

## 2022-11-20 MED ORDER — EPHEDRINE 5 MG/ML INJ
INTRAVENOUS | Status: AC
  Filled 2022-11-20: qty 5

## 2022-11-20 MED ORDER — LIDOCAINE 2% (20 MG/ML) 5 ML SYRINGE
INTRAMUSCULAR | Status: DC | PRN
  Administered 2022-11-20: 20 mg via INTRAVENOUS

## 2022-11-20 MED ORDER — MIDAZOLAM HCL 2 MG/2ML IJ SOLN
2.0000 mg | Freq: Once | INTRAMUSCULAR | Status: AC
  Administered 2022-11-20: 2 mg via INTRAVENOUS

## 2022-11-20 MED ORDER — FENTANYL CITRATE (PF) 100 MCG/2ML IJ SOLN
INTRAMUSCULAR | Status: AC
  Filled 2022-11-20: qty 2

## 2022-11-20 MED ORDER — ACETAMINOPHEN 500 MG PO TABS
ORAL_TABLET | ORAL | Status: AC
  Filled 2022-11-20: qty 2

## 2022-11-20 MED ORDER — ACETAMINOPHEN-CODEINE 300-30 MG PO TABS: 1.0000 | ORAL_TABLET | Freq: Four times a day (QID) | ORAL | 0 refills | Status: DC | PRN

## 2022-11-20 MED ORDER — ONDANSETRON HCL 4 MG/2ML IJ SOLN
INTRAMUSCULAR | Status: AC
  Filled 2022-11-20: qty 2

## 2022-11-20 MED ORDER — PHENYLEPHRINE 80 MCG/ML (10ML) SYRINGE FOR IV PUSH (FOR BLOOD PRESSURE SUPPORT)
PREFILLED_SYRINGE | INTRAVENOUS | Status: AC
  Filled 2022-11-20: qty 10

## 2022-11-20 MED ORDER — OXYMETAZOLINE HCL 0.05 % NA SOLN
NASAL | Status: AC
  Filled 2022-11-20: qty 30

## 2022-11-20 MED ORDER — ROPIVACAINE HCL 5 MG/ML IJ SOLN
INTRAMUSCULAR | Status: DC | PRN
  Administered 2022-11-20: 20 mL via PERINEURAL

## 2022-11-20 MED ORDER — ACETAMINOPHEN 500 MG PO TABS
1000.0000 mg | ORAL_TABLET | Freq: Once | ORAL | Status: AC
  Administered 2022-11-20: 1000 mg via ORAL

## 2022-11-20 MED ORDER — DEXAMETHASONE SODIUM PHOSPHATE 10 MG/ML IJ SOLN
INTRAMUSCULAR | Status: DC | PRN
  Administered 2022-11-20: 5 mg

## 2022-11-20 MED ORDER — FENTANYL CITRATE (PF) 100 MCG/2ML IJ SOLN
50.0000 ug | Freq: Once | INTRAMUSCULAR | Status: AC
  Administered 2022-11-20: 50 ug via INTRAVENOUS

## 2022-11-20 MED ORDER — MEPIVACAINE HCL (PF) 2 % IJ SOLN
INTRAMUSCULAR | Status: DC | PRN
  Administered 2022-11-20: 5 mL

## 2022-11-20 MED ORDER — PROPOFOL 500 MG/50ML IV EMUL
INTRAVENOUS | Status: DC | PRN
  Administered 2022-11-20: 125 ug/kg/min via INTRAVENOUS

## 2022-11-20 MED ORDER — SUCCINYLCHOLINE CHLORIDE 200 MG/10ML IV SOSY
PREFILLED_SYRINGE | INTRAVENOUS | Status: AC
  Filled 2022-11-20: qty 10

## 2022-11-20 SURGICAL SUPPLY — 55 items
APL PRP STRL LF DISP 70% ISPRP (MISCELLANEOUS) ×1
BLADE MINI RND TIP GREEN BEAV (BLADE) ×2 IMPLANT
BLADE OSC/SAG .038X5.5 CUT EDG (BLADE) IMPLANT
BLADE SURG 15 STRL LF DISP TIS (BLADE) ×2 IMPLANT
BLADE SURG 15 STRL SS (BLADE) ×1
BNDG CMPR 9X4 STRL LF SNTH (GAUZE/BANDAGES/DRESSINGS) ×1
BNDG ELASTIC 3X5.8 VLCR STR LF (GAUZE/BANDAGES/DRESSINGS) ×2 IMPLANT
BNDG ESMARK 4X9 LF (GAUZE/BANDAGES/DRESSINGS) ×2 IMPLANT
BNDG GAUZE DERMACEA FLUFF 4 (GAUZE/BANDAGES/DRESSINGS) ×2 IMPLANT
BNDG GZE DERMACEA 4 6PLY (GAUZE/BANDAGES/DRESSINGS) ×1
CHLORAPREP W/TINT 26 (MISCELLANEOUS) ×2 IMPLANT
CORD BIPOLAR FORCEPS 12FT (ELECTRODE) ×2 IMPLANT
COVER BACK TABLE 60X90IN (DRAPES) ×2 IMPLANT
COVER MAYO STAND STRL (DRAPES) ×2 IMPLANT
CUFF TOURN SGL QUICK 18X4 (TOURNIQUET CUFF) IMPLANT
DRAPE EXTREMITY T 121X128X90 (DISPOSABLE) ×2 IMPLANT
DRAPE OEC MINIVIEW 54X84 (DRAPES) ×2 IMPLANT
DRAPE SURG 17X23 STRL (DRAPES) ×2 IMPLANT
GAUZE SPONGE 4X4 12PLY STRL (GAUZE/BANDAGES/DRESSINGS) ×2 IMPLANT
GAUZE XEROFORM 1X8 LF (GAUZE/BANDAGES/DRESSINGS) ×2 IMPLANT
GLOVE BIO SURGEON STRL SZ7.5 (GLOVE) ×2 IMPLANT
GLOVE BIOGEL PI IND STRL 8 (GLOVE) ×2 IMPLANT
GLOVE BIOGEL PI IND STRL 8.5 (GLOVE) ×2 IMPLANT
GLOVE SURG ORTHO 8.0 STRL STRW (GLOVE) ×2 IMPLANT
GLOVE SURG SS PI 7.5 STRL IVOR (GLOVE) IMPLANT
GLOVE SURG SYN 8.0 (GLOVE) ×1 IMPLANT
GLOVE SURG SYN 8.0 PF PI (GLOVE) IMPLANT
GOWN STRL REUS W/ TWL LRG LVL3 (GOWN DISPOSABLE) ×2 IMPLANT
GOWN STRL REUS W/TWL LRG LVL3 (GOWN DISPOSABLE) ×1
GOWN STRL REUS W/TWL XL LVL3 (GOWN DISPOSABLE) ×4 IMPLANT
K-WIRE DBL .035X4 NSTRL (WIRE)
KWIRE DBL .035X4 NSTRL (WIRE) IMPLANT
LOOP VASCLR MAXI BLUE 18IN ST (MISCELLANEOUS) IMPLANT
LOOP VASCULAR MAXI 18 BLUE (MISCELLANEOUS)
LOOPS VASCLR MAXI BLUE 18IN ST (MISCELLANEOUS) IMPLANT
NDL DENTAL 27 LONG (NEEDLE) IMPLANT
NEEDLE DENTAL 27 LONG (NEEDLE) IMPLANT
NS IRRIG 1000ML POUR BTL (IV SOLUTION) ×2 IMPLANT
PACK BASIN DAY SURGERY FS (CUSTOM PROCEDURE TRAY) ×2 IMPLANT
PAD CAST 3X4 CTTN HI CHSV (CAST SUPPLIES) ×2 IMPLANT
PADDING CAST ABS COTTON 4X4 ST (CAST SUPPLIES) ×2 IMPLANT
PADDING CAST COTTON 3X4 STRL (CAST SUPPLIES) ×1
SLEEVE SCD COMPRESS KNEE MED (STOCKING) IMPLANT
SPIKE FLUID TRANSFER (MISCELLANEOUS) IMPLANT
SPLINT PLASTER CAST XFAST 3X15 (CAST SUPPLIES) IMPLANT
STOCKINETTE 4X48 STRL (DRAPES) ×2 IMPLANT
SUT ETHILON 4 0 PS 2 18 (SUTURE) ×2 IMPLANT
SUT MERSILENE 4 0 P 3 (SUTURE) IMPLANT
SUT SILK 4 0 PS 2 (SUTURE) IMPLANT
SUT VICRYL 4-0 PS2 18IN ABS (SUTURE) ×2 IMPLANT
SYR BULB EAR ULCER 3OZ GRN STR (SYRINGE) ×2 IMPLANT
SYR CONTROL 10ML LL (SYRINGE) IMPLANT
TOWEL GREEN STERILE FF (TOWEL DISPOSABLE) ×2 IMPLANT
UNDERPAD 30X36 HEAVY ABSORB (UNDERPADS AND DIAPERS) ×2 IMPLANT
VASCULAR TIE MAXI BLUE 18IN ST (MISCELLANEOUS)

## 2022-11-20 NOTE — Anesthesia Preprocedure Evaluation (Addendum)
Anesthesia Evaluation  Patient identified by MRN, date of birth, ID band Patient awake    Reviewed: Allergy & Precautions, NPO status , Patient's Chart, lab work & pertinent test results  Airway Mallampati: II  TM Distance: >3 FB Neck ROM: Full    Dental  (+) Teeth Intact, Dental Advisory Given Lower braces:   Pulmonary asthma , sleep apnea and Continuous Positive Airway Pressure Ventilation , former smoker   Pulmonary exam normal breath sounds clear to auscultation       Cardiovascular hypertension, Pt. on medications Normal cardiovascular exam Rhythm:Regular Rate:Normal     Neuro/Psych  Headaches PSYCHIATRIC DISORDERS Anxiety Depression    TIA   GI/Hepatic Neg liver ROS, hiatal hernia,GERD  ,,  Endo/Other  negative endocrine ROS    Renal/GU negative Renal ROS  negative genitourinary   Musculoskeletal  (+) Arthritis ,  Fibromyalgia -Spinal cord stimulator, currently off   Abdominal   Peds  Hematology negative hematology ROS (+)   Anesthesia Other Findings G6PD deficiency  Reproductive/Obstetrics                             Anesthesia Physical Anesthesia Plan  ASA: 3  Anesthesia Plan: MAC and Regional   Post-op Pain Management: Regional block* and Tylenol PO (pre-op)*   Induction: Intravenous  PONV Risk Score and Plan: 2 and Propofol infusion, Treatment may vary due to age or medical condition, Midazolam, Ondansetron and Dexamethasone  Airway Management Planned: Natural Airway  Additional Equipment:   Intra-op Plan:   Post-operative Plan:   Informed Consent: I have reviewed the patients History and Physical, chart, labs and discussed the procedure including the risks, benefits and alternatives for the proposed anesthesia with the patient or authorized representative who has indicated his/her understanding and acceptance.     Dental advisory given  Plan Discussed with:  CRNA  Anesthesia Plan Comments:        Anesthesia Quick Evaluation

## 2022-11-20 NOTE — Progress Notes (Signed)
Assisted Dr. Lanetta Inch with right, supraclavicular, ultrasound guided block. Side rails up, monitors on throughout procedure. See vital signs in flow sheet. Tolerated Procedure well.

## 2022-11-20 NOTE — Anesthesia Procedure Notes (Signed)
Anesthesia Regional Block: Supraclavicular block   Pre-Anesthetic Checklist: , timeout performed,  Correct Patient, Correct Site, Correct Laterality,  Correct Procedure, Correct Position, site marked,  Risks and benefits discussed,  Pre-op evaluation,  At surgeon's request and post-op pain management  Laterality: Right  Prep: Maximum Sterile Barrier Precautions used, chloraprep       Needles:  Injection technique: Single-shot  Needle Type: Echogenic Stimulator Needle     Needle Length: 5cm  Needle Gauge: 21     Additional Needles:   Procedures:,,,, ultrasound used (permanent image in chart),,    Narrative:  Start time: 11/20/2022 10:50 AM End time: 11/20/2022 10:53 AM Injection made incrementally with aspirations every 5 mL. Anesthesiologist: Freddrick March, MD

## 2022-11-20 NOTE — H&P (Signed)
Ashley [name] is an 67 y.o. female.   Chief Complaint: right long finger snapping HPI: 67 yo female with right long finger pip joint hyperextension and snapping of joint.  This is bothersome to her.  She wishes to have right long finger volar plate reconstruction/imbrication and tenodesis to reduce snapping sensation.  Allergies:  Allergies  Allergen Reactions   Latex Itching, Swelling and Other (See Comments)    redness   Sulfa Antibiotics Diarrhea and Nausea And Vomiting   Aspirin Other (See Comments)    Can cause her to bleed out due to G6 PD   Tetracyclines & Related Diarrhea and Nausea And Vomiting   Wound Dressing Adhesive     Bandaids and all tape!    Oxycodone Itching    "all over"   Tramadol Other (See Comments)    Makes her feel " drugged"    Past Medical History:  Diagnosis Date   Anemia    Anxiety 2020   Arthritis    Asthma 2013-2014   Depression    Dyspnea 2013   Fibromyalgia 09/04/2014   GERD (gastroesophageal reflux disease)    Glucose-6-phosphate dehydrogenase deficiency    Headache    History of hiatal hernia    Hypertension    Lumbar radicular pain 09/04/2014   Pre-diabetes    Sacroiliac joint pain    nonunion of left SI joint   Sleep apnea 2016   Spinal headache    TIA (transient ischemic attack)    Wears glasses     Past Surgical History:  Procedure Laterality Date   ABDOMINAL HYSTERECTOMY     BACK SURGERY  2002   BREAST LUMPECTOMY Left 1985   BREAST SURGERY     BUNIONECTOMY     COLONOSCOPY     DILATION AND CURETTAGE OF UTERUS     ESOPHAGEAL MANOMETRY [name] 11/21/2013   Procedure: ESOPHAGEAL MANOMETRY (EM);  Surgeon: Shirley Friar, MD;  Location: [facility];  Service: Endoscopy;  Laterality: [name];   KNEE ARTHROSCOPY W/ MENISCAL REPAIR     LUMBAR LAMINECTOMY     MULTIPLE TOOTH EXTRACTIONS     NASAL SINUS SURGERY     PAROTIDECTOMY Left 09/21/2020   Procedure: LEFT SUPERFICIAL PAROTIDECTOMY WITH NERVE MONITORING;   Surgeon: Osborn Coho, MD;  Location: [facility] OR;  Service: ENT;  Laterality: Left;   SACROILIAC JOINT FUSION Left 08/05/2018   Procedure: REVISION LEFT SACROILIAC JOINT FUSION WITH INSTRUMENTATION AND ALLOGRAFT;  Surgeon: Estill Bamberg, MD;  Location: [facility] OR;  Service: Orthopedics;  Laterality: Left;   SPINAL CORD STIMULATOR IMPLANT     TONSILLECTOMY      Family History: Family History  Problem Relation Age of Onset   Aneurysm Father    Bipolar disorder Brother    Diabetes Brother    Migraines Mother    Eczema Sister    Urticaria Sister    Migraines Daughter    Urticaria Daughter    Asthma Daughter    Cancer Other    Hypertension Other    Diabetes Other    Allergic rhinitis Neg Hx    Angioedema Neg Hx    Immunodeficiency Neg Hx     Social History:   reports that she quit smoking about 30 years ago. Her smoking use included cigarettes. She has a 18.00 pack-year smoking history. She has never used smokeless tobacco. She reports current alcohol use of about 1.0 standard drink of alcohol per week. She reports that she does not use drugs.  Medications: Medications Prior to  Admission  Medication Sig Dispense Refill   acetaminophen (TYLENOL) 650 MG CR tablet Take 1,300 mg by mouth 2 (two) times daily as needed for pain.     albuterol (VENTOLIN HFA) 108 (90 Base) MCG/ACT inhaler Inhale 1-2 puffs into the lungs every 6 (six) hours as needed for wheezing or shortness of breath. 16 g 6   atorvastatin (LIPITOR) 20 MG tablet Take 20 mg by mouth daily.      azelastine (OPTIVAR) 0.05 % ophthalmic solution Place 2 drops into both eyes 2 (two) times daily. 6 mL 4   baclofen (LIORESAL) 20 MG tablet Take 1 tablet (20 mg total) by mouth 3 (three) times daily. 15 each 0   budesonide-formoterol (SYMBICORT) 80-4.5 MCG/ACT inhaler Inhale 2 puffs into the lungs 2 (two) times daily. 1 each 5   DULoxetine (CYMBALTA) 30 MG capsule Take 30 mg by mouth daily.     DULoxetine (CYMBALTA) 60 MG capsule Take 60  mg by mouth daily.      fluticasone (FLONASE) 50 MCG/ACT nasal spray Place 2 sprays into both nostrils 2 (two) times daily. 48 g 3   gabapentin (NEURONTIN) 100 MG capsule TAKE 1 CAPSULE 3 TIMES DAILY. TAKE IN ADDITION TO 800 MG TABS FOR TOTAL OF 900 MG 90 capsule 2   gabapentin (NEURONTIN) 800 MG tablet Take 1 tablet (800 mg total) by mouth 3 (three) times daily. 90 tablet 5   hydrochlorothiazide (HYDRODIURIL) 12.5 MG tablet Take 12.5 mg by mouth 2 (two) times daily with breakfast and lunch.   1   losartan (COZAAR) 25 MG tablet Take 25 mg by mouth 2 (two) times daily with breakfast and lunch.   1   metformin (FORTAMET) 500 MG (OSM) 24 hr tablet Take 500 mg by mouth daily with breakfast.     montelukast (SINGULAIR) 10 MG tablet Take 1 tablet (10 mg total) by mouth at bedtime. 90 tablet 0   Multiple Vitamin (MULTIVITAMIN WITH MINERALS) TABS tablet Take 1 tablet by mouth daily at 3 pm. Centrum for Women     naproxen (EC NAPROSYN) 500 MG EC tablet Take 500 mg by mouth 2 (two) times daily with a meal.      nortriptyline (PAMELOR) 25 MG capsule Take 1 capsule (25 mg total) by mouth at bedtime. 30 capsule 0   NURTEC 75 MG TBDP DISSOLVE 1 TABLET IN MOUTH AS NEEDED 8 tablet 3   pantoprazole (PROTONIX) 40 MG tablet Take 40 mg by mouth 2 (two) times daily.      Polyethyl Glycol-Propyl Glycol (SYSTANE) 0.4-0.3 % SOLN Place 1 drop into both eyes every 6 (six) hours as needed (dry eyes). 30 mL 3   BELBUCA 150 MCG FILM Take 1 strip by mouth 2 (two) times daily.     diphenhydrAMINE (BENADRYL) 25 MG tablet Take 25 mg by mouth at bedtime as needed. Take with oxycodone/acetaminophen to prevent itching.     Galcanezumab-gnlm (EMGALITY) 120 MG/ML SOAJ Inject 1 pen. into the skin every 30 (thirty) days. 1.12 mL 11   ondansetron (ZOFRAN) 4 MG tablet Take 1 tablet (4 mg total) by mouth every 8 (eight) hours as needed for nausea or vomiting. 20 tablet 1   promethazine (PHENERGAN) 12.5 MG tablet Take 12.5-25 mg by mouth  every 6 (six) hours as needed for nausea or vomiting.      Results for orders placed or performed during the hospital encounter of 11/20/22 (from the past 48 hour(s))  Basic metabolic panel     Status: None  Collection Time: 11/18/22 12:28 PM  Result Value Ref Range   Sodium 138 135 - 145 mmol/L   Potassium 3.7 3.5 - 5.1 mmol/L   Chloride 98 98 - 111 mmol/L   CO2 30 22 - 32 mmol/L   Glucose, Bld 95 70 - 99 mg/dL    Comment: Glucose reference range applies only to samples taken after fasting for at least 8 hours.   BUN 8 8 - 23 mg/dL   Creatinine, Ser 0.83 0.44 - 1.00 mg/dL   Calcium 9.1 8.9 - 10.3 mg/dL   GFR, Estimated >60 >60 mL/min    Comment: (NOTE) Calculated using the CKD-EPI Creatinine Equation (2021)    Anion gap 10 5 - 15    Comment: Performed at [facility] [address] [place], [place] [address]  Glucose, capillary     Status: None   Collection Time: 11/20/22 10:17 AM  Result Value Ref Range   Glucose-Capillary 94 70 - 99 mg/dL    Comment: Glucose reference range applies only to samples taken after fasting for at least 8 hours.    No results found.    Blood pressure 123/74, pulse 81, temperature 97.9 F (36.6 C), temperature source Oral, resp. rate 11, height 5\' 9"  (1.753 m), weight 98.6 kg, SpO2 96 %.  General appearance: alert, cooperative, and appears stated age Head: Normocephalic, without obvious abnormality, atraumatic Neck: supple, symmetrical, trachea midline Extremities: Intact sensation and capillary refill all digits.  +epl/fpl/io.  No wounds.  Pulses: 2+ and symmetric Skin: Skin color, texture, turgor normal. No rashes or lesions Neurologic: Grossly normal Incision/Wound: none  Assessment/Plan Right long finger hyperextension with snapping pip joint.  She wishes to proceed with volar plate reconstruction and tenodesis to try to reduce snapping.  Non operative and operative treatment options have been discussed with the patient and  patient wishes to proceed with operative treatment. Risks, benefits, and alternatives of surgery have been discussed and the patient agrees with the plan of care.   Leanora Cover 11/20/2022, 11:02 AM

## 2022-11-20 NOTE — Discharge Instructions (Addendum)
[facility] Instructions Hand Surgery  Wound Care: Keep your hand elevated above the level of your heart.  Do not allow it to dangle by your side.  Keep the dressing dry and do not remove it unless your doctor advises you to do so.  He will usually change it at the time of your post-op visit.  Moving your fingers is advised to stimulate circulation but will depend on the site of your surgery.  If you have a splint applied, your doctor will advise you regarding movement.  Activity: Do not drive or operate machinery today.  Rest today and then you may return to your normal activity and work as indicated by your physician.  Diet:  Drink liquids today or eat a light diet.  You may resume a regular diet tomorrow.    General expectations: Pain for two to three days. Fingers may become slightly swollen.  Call your doctor if any of the following occur: Severe pain not relieved by pain medication. Elevated temperature. Dressing soaked with blood. Inability to move fingers. White or bluish color to fingers.  You may have Tylenol again after 4pm today.   Post Anesthesia Home Care Instructions  Activity: Get plenty of rest for the remainder of the day. A responsible individual must stay with you for 24 hours following the procedure.  For the next 24 hours, DO NOT: -Drive a car -Paediatric nurse -Drink alcoholic beverages -Take any medication unless instructed by your physician -Make any legal decisions or sign important papers.  Meals: Start with liquid foods such as gelatin or soup. Progress to regular foods as tolerated. Avoid greasy, spicy, heavy foods. If nausea and/or vomiting occur, drink only clear liquids until the nausea and/or vomiting subsides. Call your physician if vomiting continues.  Special Instructions/Symptoms: Your throat may feel dry or sore from the anesthesia or the breathing tube placed in your throat during surgery. If this causes discomfort, gargle with warm  salt water. The discomfort should disappear within 24 hours.  If you had a scopolamine patch placed behind your ear for the management of post- operative nausea and/or vomiting:  1. The medication in the patch is effective for 72 hours, after which it should be removed.  Wrap patch in a tissue and discard in the trash. Wash hands thoroughly with soap and water. 2. You may remove the patch earlier than 72 hours if you experience unpleasant side effects which may include dry mouth, dizziness or visual disturbances. 3. Avoid touching the patch. Wash your hands with soap and water after contact with the patch.   Regional Anesthesia Blocks  1. Numbness or the inability to move the "blocked" extremity may last from 3-48 hours after placement. The length of time depends on the medication injected and your individual response to the medication. If the numbness is not going away after 48 hours, call your surgeon.  2. The extremity that is blocked will need to be protected until the numbness is gone and the  Strength has returned. Because you cannot feel it, you will need to take extra care to avoid injury. Because it may be weak, you may have difficulty moving it or using it. You may not know what position it is in without looking at it while the block is in effect.  3. For blocks in the legs and feet, returning to weight bearing and walking needs to be done carefully. You will need to wait until the numbness is entirely gone and the strength has returned.  You should be able to move your leg and foot normally before you try and bear weight or walk. You will need someone to be with you when you first try to ensure you do not fall and possibly risk injury.  4. Bruising and tenderness at the needle site are common side effects and will resolve in a few days.  5. Persistent numbness or new problems with movement should be communicated to the surgeon or the [facility] [phone] [place] [phone]).

## 2022-11-20 NOTE — Transfer of Care (Signed)
Immediate Anesthesia Transfer of Care Note  Patient: Ashley [name]  Procedure(s) Performed: RIGHT LONG FINGER TENODESIS (Right: Finger) VOLAR PLATE RECONSTRUCTION/IMBRICATION (Right: Finger)  Patient Location: PACU  Anesthesia Type:MAC combined with regional for post-op pain  Level of Consciousness: sedated  Airway & Oxygen Therapy: Patient Spontanous Breathing and Patient connected to face mask oxygen  Post-op Assessment: Report given to RN and Post -op Vital signs reviewed and stable  Post vital signs: Reviewed and stable  Last Vitals:  Vitals Value Taken Time  BP    Temp    Pulse    Resp    SpO2      Last Pain:  Vitals:   11/20/22 1016  TempSrc: Oral  PainSc: 0-No pain         Complications: No notable events documented.

## 2022-11-20 NOTE — Op Note (Signed)
I assisted Surgeon(s) and Role:    * Leanora Cover, MD - Primary    Daryll Brod, MD - Assisting on the Procedure(s): RIGHT LONG FINGER TENODESIS VOLAR PLATE RECONSTRUCTION/IMBRICATION on 11/20/2022.  I provided assistance on this case as follows: Set up, approach, identification of the superficialis harvesting and superficialis transfer of the superficialis maintaining finger in flexion tenodesis closure of the wound application dressing and splint.  Electronically signed by: Daryll Brod, MD Date: 11/20/2022 Time: 12:21 PM

## 2022-11-20 NOTE — Op Note (Addendum)
NAME: Ashley [name] MEDICAL RECORD NO: [id number] DATE OF BIRTH: 1956/03/20 FACILITY: [facility] LOCATION: [facility] PHYSICIAN: Tennis Must, MD   OPERATIVE REPORT   DATE OF PROCEDURE: 11/20/22    PREOPERATIVE DIAGNOSIS: Right Long finger PIP joint hyperextension and snapping joint   POSTOPERATIVE DIAGNOSIS: Right long finger PIP joint hyperextension and snapping joint   PROCEDURE: Right long finger FDS tenodesis   SURGEON:  Leanora Cover, M.D.   ASSISTANT: Daryll Brod, MD   ANESTHESIA:  Regional with sedation   INTRAVENOUS FLUIDS:  Per anesthesia flow sheet.   ESTIMATED BLOOD LOSS:  Minimal.   COMPLICATIONS:  None.   SPECIMENS:  none   TOURNIQUET TIME:    Total Tourniquet Time Documented: Upper Arm (Right) - 38 minutes Total: Upper Arm (Right) - 38 minutes    DISPOSITION:  Stable to PACU.   INDICATIONS: 67 year old female with hyperextension of the right long finger PIP joint and snapping joint.  She wishes to proceed with volar plate reconstruction versus FDS tenodesis.  Risks, benefits and alternatives of surgery were discussed including the risks of blood loss, infection, damage to nerves, vessels, tendons, ligaments, bone for surgery, need for additional surgery, complications with wound healing, continued pain, stiffness, , recurrence.  She voiced understanding of these risks and elected to proceed.  OPERATIVE COURSE:  After being identified preoperatively by myself,  the patient and I agreed on the procedure and site of the procedure.  The surgical site was marked.  Surgical consent had been signed. Preoperative IV antibiotic prophylaxis was given. She was transferred to the operating room and placed on the operating table in supine position with the right upper extremity on an arm board.  Sedation was induced by the anesthesiologist. A regional block had been performed by anesthesia in preoperative holding.    Right upper extremity was prepped  and draped in normal sterile orthopedic fashion.  A surgical pause was performed between the surgeons, anesthesia, and operating room staff and all were in agreement as to the patient, procedure, and site of procedure.  Tourniquet at the proximal aspect of the extremity was inflated to 250 mmHg after exsanguination of the arm with an Esmarch bandage.  A Bruner incision was made over the long finger over the PIP joint and into the palm.  This was carried in subcutaneous tissues by spreading technique.  Bipolar electrocautery was used to obtain hemostasis.  The PIP joint could be seen to be hyperextended.  There was scarring at the site of the A1 pulley.  The area was opened.  The ulnar arm of the FDS tendon was identified and released with the [facility] blade.  The sheath was then opened just distal to the PIP joint.  The ulnar arm of the FDS was again identified and the FDS brought through the incision and the sheath.  The chiasm between the FDS slips was released.  The harvested FDS was then passed underneath the distal aspect of the A2 pulley and through the A2 pulley back onto itself.  It was sutured back onto itself with 4-0 Mersilene suture.  The finger was placed in approximately 10 degrees of flexion at the PIP joint while doing this.  This created a flexion moment of the PIP joint of approximately 10 degrees.  The wound was copiously irrigated with sterile saline and closed with 4-0 nylon in a horizontal mattress fashion.  The wound was dressed with sterile Xeroform and 4 x 4's and wrapped with a Kerlix bandage.  A dorsal blocking splint was placed and wrapped with Kerlix and Ace bandage.  The tourniquet was deflated at the 8 minutes.  Fingertips were pink with brisk capillary refill after deflation of tourniquet.  The operative  drapes were broken down.  The patient was awoken from anesthesia safely.  She was transferred back to the stretcher and taken to PACU in stable condition.  I will see her back in the  office in 1 week for postoperative followup.  I will give her a prescription for Tylenol 3 1 p.o. every 6 hours as needed pain dispense #20.   Leanora Cover, MD Electronically signed, 11/20/22

## 2022-11-21 ENCOUNTER — Encounter (HOSPITAL_BASED_OUTPATIENT_CLINIC_OR_DEPARTMENT_OTHER): Payer: Self-pay | Admitting: Orthopedic Surgery

## 2022-11-21 ENCOUNTER — Encounter (HOSPITAL_BASED_OUTPATIENT_CLINIC_OR_DEPARTMENT_OTHER): Payer: Federal, State, Local not specified - PPO | Admitting: Physical Therapy

## 2022-11-21 ENCOUNTER — Encounter (HOSPITAL_BASED_OUTPATIENT_CLINIC_OR_DEPARTMENT_OTHER): Payer: Self-pay | Admitting: Physical Therapy

## 2022-11-21 NOTE — Anesthesia Postprocedure Evaluation (Signed)
Anesthesia Post Note  Patient: Ashley [name]  Procedure(s) Performed: RIGHT LONG FINGER TENODESIS (Right: Finger) VOLAR PLATE RECONSTRUCTION/IMBRICATION (Right: Finger)     Patient location during evaluation: PACU Anesthesia Type: Regional and MAC Level of consciousness: awake and alert Pain management: pain level controlled Vital Signs Assessment: post-procedure vital signs reviewed and stable Respiratory status: spontaneous breathing, nonlabored ventilation, respiratory function stable and patient connected to nasal cannula oxygen Cardiovascular status: stable and blood pressure returned to baseline Postop Assessment: no apparent nausea or vomiting Anesthetic complications: no  No notable events documented.  Last Vitals:  Vitals:   11/20/22 1320 11/20/22 1343  BP: (P) 137/85 137/85  Pulse: (P) 77 77  Resp: (P) 16 16  Temp: (!) (P) 36.4 C (!) 36.4 C  SpO2: (P) 96% 96%    Last Pain:  Vitals:   11/20/22 1343  TempSrc:   PainSc: 0-No pain                 Gurvir Schrom L Lalani Winkles

## 2022-11-27 DIAGNOSIS — Z4889 Encounter for other specified surgical aftercare: Secondary | ICD-10-CM | POA: Diagnosis not present

## 2022-11-27 DIAGNOSIS — M79644 Pain in right finger(s): Secondary | ICD-10-CM | POA: Diagnosis not present

## 2022-11-27 DIAGNOSIS — M7989 Other specified soft tissue disorders: Secondary | ICD-10-CM | POA: Diagnosis not present

## 2022-11-27 DIAGNOSIS — M25641 Stiffness of right hand, not elsewhere classified: Secondary | ICD-10-CM | POA: Diagnosis not present

## 2022-12-04 DIAGNOSIS — R030 Elevated blood-pressure reading, without diagnosis of hypertension: Secondary | ICD-10-CM

## 2022-12-05 DIAGNOSIS — M79644 Pain in right finger(s): Secondary | ICD-10-CM | POA: Diagnosis not present

## 2022-12-05 DIAGNOSIS — M6530 Trigger finger, unspecified finger: Secondary | ICD-10-CM | POA: Diagnosis not present

## 2022-12-05 DIAGNOSIS — M25641 Stiffness of right hand, not elsewhere classified: Secondary | ICD-10-CM | POA: Diagnosis not present

## 2022-12-05 DIAGNOSIS — Z4889 Encounter for other specified surgical aftercare: Secondary | ICD-10-CM | POA: Diagnosis not present

## 2022-12-17 ENCOUNTER — Encounter (HOSPITAL_BASED_OUTPATIENT_CLINIC_OR_DEPARTMENT_OTHER): Payer: Self-pay | Admitting: Physical Therapy

## 2022-12-17 ENCOUNTER — Ambulatory Visit (HOSPITAL_BASED_OUTPATIENT_CLINIC_OR_DEPARTMENT_OTHER): Payer: Federal, State, Local not specified - PPO | Attending: Neurosurgery | Admitting: Physical Therapy

## 2022-12-17 DIAGNOSIS — M5459 Other low back pain: Secondary | ICD-10-CM | POA: Diagnosis not present

## 2022-12-17 DIAGNOSIS — M6281 Muscle weakness (generalized): Secondary | ICD-10-CM | POA: Diagnosis not present

## 2022-12-17 DIAGNOSIS — R293 Abnormal posture: Secondary | ICD-10-CM | POA: Diagnosis not present

## 2022-12-17 NOTE — Therapy (Signed)
OUTPATIENT PHYSICAL THERAPY THORACOLUMBAR TREATMENT  Progress Note/Recert Reporting Period 11/05/22 to 12/17/22  See note below for Objective Data and Assessment of Progress/Goals.     Patient Name: Ashley [name] MRN: [id number] DOB:Apr 03, 1956, 67 y.o., female Today's Date: 12/17/2022   PT End of Session - 12/17/22 1346     Visit Number 13    Number of Visits 23    Date for PT Re-Evaluation 01/28/23    Authorization Type [employer]    PT Start Time 0950    PT Stop Time 1030    PT Time Calculation (min) 40 min    Activity Tolerance Patient tolerated treatment well    Behavior During Therapy [facility] for tasks assessed/performed                 Past Medical History:  Diagnosis Date   Anemia    Anxiety 2020   Arthritis    Asthma 2013-2014   Depression    Dyspnea 2013   Fibromyalgia 09/04/2014   GERD (gastroesophageal reflux disease)    Glucose-6-phosphate dehydrogenase deficiency    Headache    History of hiatal hernia    Hypertension    Lumbar radicular pain 09/04/2014   Pre-diabetes    Sacroiliac joint pain    nonunion of left SI joint   Sleep apnea 2016   Spinal headache    TIA (transient ischemic attack)    Wears glasses    Past Surgical History:  Procedure Laterality Date   ABDOMINAL HYSTERECTOMY     BACK SURGERY  2002   BREAST LUMPECTOMY Left 1985   BREAST SURGERY     BUNIONECTOMY     COLONOSCOPY     DILATION AND CURETTAGE OF UTERUS     ESOPHAGEAL MANOMETRY [name] 11/21/2013   Procedure: ESOPHAGEAL MANOMETRY (EM);  Surgeon: Lear Ng, MD;  Location: [facility];  Service: Endoscopy;  Laterality: [name];   FINGER ARTHROPLASTY Right 11/20/2022   Procedure: VOLAR PLATE RECONSTRUCTION/IMBRICATION;  Surgeon: Leanora Cover, MD;  Location: [place];  Service: Orthopedics;  Laterality: Right;   HAND TENODESIS Right 11/20/2022   Procedure: RIGHT LONG FINGER TENODESIS;  Surgeon: Leanora Cover, MD;  Location: [place];  Service: Orthopedics;  Laterality: Right;  60  MIN   KNEE ARTHROSCOPY W/ MENISCAL REPAIR     LUMBAR LAMINECTOMY     MULTIPLE TOOTH EXTRACTIONS     NASAL SINUS SURGERY     PAROTIDECTOMY Left 09/21/2020   Procedure: LEFT SUPERFICIAL PAROTIDECTOMY WITH NERVE MONITORING;  Surgeon: Jerrell Belfast, MD;  Location: [place];  Service: ENT;  Laterality: Left;   SACROILIAC JOINT FUSION Left 08/05/2018   Procedure: REVISION LEFT SACROILIAC JOINT FUSION WITH INSTRUMENTATION AND ALLOGRAFT;  Surgeon: Phylliss Bob, MD;  Location: [place];  Service: Orthopedics;  Laterality: Left;   SPINAL CORD STIMULATOR IMPLANT     TONSILLECTOMY     Patient Active Problem List   Diagnosis Date Noted   Mass of left parotid gland 09/21/2020   Parotid mass 09/21/2020   De Quervain's tenosynovitis 06/22/2019   Trigger middle finger of right hand 04/27/2019   Stiffness of finger joint, right 04/27/2019   Xerostomia 02/23/2019   Dry eyes 02/23/2019   Family history of diabetes mellitus 01/26/2019   Family history of ischemic heart disease (IHD) 01/26/2019   Family history of stroke 01/26/2019   Hiatal hernia 01/26/2019   Knee pain, left 01/26/2019   Sacroiliac joint dysfunction 08/05/2018   Spondylosis of lumbar region without  myelopathy or radiculopathy 05/16/2016   Generalized headaches 04/21/2016   History of migraine 04/21/2016   Menopause 04/09/2016   Benign hypertension 04/09/2016   Depressive disorder 04/09/2016   Disorder of thyroid gland 04/09/2016   Dysphagia 04/09/2016   Chronic low back pain with left-sided sciatica 03/24/2016   Asthmatic bronchitis 09/06/2014   GERD (gastroesophageal reflux disease) 09/06/2014   Allergic rhinitis 09/06/2014   Lumbar radicular pain 09/04/2014   Fibromyalgia 09/04/2014   Back pain 06/01/2013   Cubital tunnel syndrome 06/01/2013   Muscle spasms of neck 06/01/2013   Myofascial pain 06/01/2013   Neuropathic pain 06/01/2013   Right wrist pain  06/01/2013   Shoulder pain 06/01/2013   Primary localized osteoarthrosis, lower leg 12/08/2012   Encounter for routine gynecological examination 11/16/2012   Borderline hypertension 06/16/2012   Schatzki's ring 05/21/2012   G6PD deficiency 04/14/2012   Lumbar post-laminectomy syndrome 08/26/2011   SOB (shortness of breath) 04/27/2009    PCP: None  REFERRING PROVIDER: Aura Dials, MD   REFERRING DIAG: Lumbago with sciatica, left side [M54.42], Other chronic pain [G89.29]  Rationale for Evaluation and Treatment Rehabilitation  THERAPY DIAG:  Other low back pain  Muscle weakness (generalized)  Abnormal posture  ONSET DATE: February 2016.  SUBJECTIVE:                                                                                                                                                                                           SUBJECTIVE STATEMENT: Pt has been out of therapy for a right 3rd finger surgery.  She is unable to get it wet. Nerve stimulator has helped a lot.  Pain is worse by end of week.  I have kept up with my HEP. Planning to get membership at [facility].    PERTINENT HISTORY:  SI joint fusion in 2019 and SI joint revision in 2020 ; Lumbar laminectomy and foraminatomy in 2002 Spinal cord stimulator in June 2023 Anxiety, Depression, Fibromyalgia, HTN, Pre Diabetic, TIA, Scoliosis.   PAIN:  Are you having pain? Yes: NPRS scale: 4/10  Pain location:  L sided lumbar pain, L hip pain to post knee Pain description: Sharp pain, burning. Aggravating factors: Bending over, reaching OH, walking, standing, sitting.  Relieving factors: Opioids, rest, ice, heat, stimulator.   PRECAUTIONS: None  WEIGHT BEARING RESTRICTIONS: No  FALLS:  Has patient fallen in last 6 months? Yes. Number of falls 2, when stepping backwards onto a step, and when cleaning behind bedroom door and trying to move something out of the way. Both falls in posterior direction.    LIVING ENVIRONMENT: Lives with:  Lives with her mother  who has dementia Lives in: House/apartment Stairs: Yes: External: 3 steps; none and 8 in back with railings.  Has following equipment at home:  Electric scooter if long distance, walking stick.   OCCUPATION: Disability due to scoring 30% on functional capacity evaluation.   PLOF: Independent  PATIENT GOALS: Pt would like to reduce her pain and be more functional.    OBJECTIVE:   DIAGNOSTIC FINDINGS:  None.   PATIENT SURVEYS:  FOTO 40.76%, 45% in 14 vitis.  11/12/22: 41% 12/17/22: 36 %   SCREENING FOR RED FLAGS: Bowel or bladder incontinence: No   COGNITION: Overall cognitive status: Within functional limits for tasks assessed                          SENSATION: WFL     POSTURE: rounded shoulders, forward head, and increased lumbar lordosis   PALPATION: Entire L side of QL, Paraspinals and into glutes   LUMBAR ROM:    AROM eval 11/05/21  Flexion WFL, increases symptoms WFL no pain  Extension WFL   Right lateral flexion WFL   Left lateral flexion WFL - increases symptoms.  Wfl no pain  Right rotation WFL   Left rotation WFL    (Blank rows = not tested)   LOWER EXTREMITY ROM:      Active  Right eval Left eval Right /  Left 11/05/22  Hip flexion WFL WFL, increases symptoms Wfl  no pain  Hip extension       Hip abduction       Knee flexion [facility]   Knee extension [facility] WFL, increases symptoms Wfl no pain   (Blank rows = not tested)   LOWER EXTREMITY MMT:       P!=pain MMT Right eval Left eval Right / Left 11/05/22 Right / Left 12/17/22  Hip flexion 5+ 4 limited by pain 4 P! 4+ to 5- P!  Hip extension        Hip abduction     5 / 4   Knee flexion 5 4 limited by pain 5 / 4+ P! 5 / 5- P!  Knee extension 5 4 limited by pain 5/ 4+ P! 5   (Blank rows = not tested)   LUMBAR SPECIAL TESTS:  Slump test: Positive   FUNCTIONAL TESTS:  5 times sit to stand: 19.97 seconds.  Timed up and go (TUG): 12.01  sec  11/05/22 5 x STS: 15.07 TUG: 10.37  12/17/22 5 x STS: 15 TUG: 10.94   GAIT: Distance walked: 50 ft Assistive device utilized: None Level of assistance: Complete Independence Comments: Pt walks with decreased step length and decreased weight on L LE.  11/05/22:pt reports she can walk for 10-15 minutes.  Amb without AD. Walks 500 ft from parking lot to pool with discomfort but not limited  TODAY'S TREATMENT:  Re-assessment Objective testing  Pt seen for aquatic therapy today.  Treatment took place in water 3.25-4.75 ft in depth at the [facility]. Temp of water was 91.  Pt entered/exited the pool via stairs independently with bilat rail.   * in 4+ ft of water: unsupported walking forward/ backward / side stepping / high knee marching forward/ backward; * straddling yellow noodle:  cycling; add/abd and skiing with ue hand support  Foto completed  PATIENT EDUCATION:  Education details:relevant anatomy, HEP, exercise form and rationale, and POC. Person educated: Patient Education method: Customer service manager, verbal cues Education comprehension: verbalized understanding and returned demonstration,  verbal cues required  HOME EXERCISE PROGRAM: Pt  is already performing LTR, bridges, supine HS stretch, and Piriformis stretch after reviewing. She denied a handout.  Access Code: [email or web address] URL: [email or web address] Date: 09/10/2022 Prepared by: Ronny Flurry  Exercises - Supine Transversus Abdominis Bracing - Hands on Stomach  - 2 x daily - 7 x weekly - 2 sets - 10 reps - 5 seconds hold - Supine March  - 1-2 x daily - 7 x weekly - 2 sets - 10 reps - Hooklying Clamshell with Resistance  - 1 x daily - 4 x weekly - 2 sets - 10 reps   ASSESSMENT:  CLINICAL IMPRESSION:  PN: Pt reports good overall response to insertion of spinal nerve stimulator with decreased pain.  She reports pain today fairly high on scale.  Not certain she is using it  properly as she moves with little guarding. She is instructed on use of pain scale and readjusts. She has improvement in strength with decrease in pain as noted in chart above. Her objective testing has not changed since last assessment nor has her Foto suggesting pt is beginning to reach towards her maximal potential. Was hesitant to engage in aquatic intervention where hand would be wet due to recent surgery.  She will be seen x 1 more in aquatic setting for instruction on HEP and will remain land based for a few sessions to meet goals and establish final land based HEP.      OBJECTIVE IMPAIRMENTS: decreased activity tolerance, difficulty walking, decreased balance, decreased endurance, decreased mobility, decreased ROM, decreased strength, impaired flexibility, impaired UE/LE use, postural dysfunction, and pain.  ACTIVITY LIMITATIONS: bending, lifting, carry, locomotion, cleaning, community activity, driving, and or occupation  PERSONAL FACTORS: Anxiety, Depression, Fibromyalgia, HTN, Pre Diabetic, TIA are also affecting patient's functional outcome.  REHAB POTENTIAL: Good  CLINICAL DECISION MAKING: Stable/uncomplicated  EVALUATION COMPLEXITY: Low    GOALS: Short term PT Goals Target date: 09/23/22 Pt will be I and compliant with HEP. Baseline:  Goal status: Met Pt will decrease pain by 25% overall Baseline: Goal status: Met  Long term PT goals Target date: 10/21/22 Pt will improve ROM to [facility] to improve functional mobility Baseline: Goal status: Met Pt will improve  hip/knee strength to at least 5-/5 MMT to improve functional strength Baseline: Goal status: ongoing 12/17/22 Pt will improve FOTO to at least 45% functional to show improved function Baseline: Goal status: ongoing 12/17/22 Pt will reduce pain by overall 50% overall with usual activity Baseline: Goal status: Ongoing Pt will reduce pain to overall less than 2-3/10 with usual activity and work  activity. Baseline: Goal status: ongoing 12/17/22 Pt will be able to ambulate community distances at least 1000 ft WNL gait pattern without complaints Baseline: Goal status: New      7. Pt will improve her STS by 2.3 seconds for Minimal clinically important difference.         Baseline:         Goal status: ongoing 12/17/22       8. Pt will improve TUG by 3.4 seconds or Minimal clinically important difference.        Baseline:        Goal status: ongoing 12/17/22  PLAN: PT FREQUENCY: 1 week  PT DURATION: 6 weeks   PLANNED INTERVENTIONS (unless contraindicated): aquatic PT, Canalith repositioning, cryotherapy, Electrical stimulation, Iontophoresis with 4 mg/ml dexamethasome, Moist heat, traction, Ultrasound, gait training, Therapeutic exercise, balance training, neuromuscular re-education, patient/family education, prosthetic training, manual techniques, passive  ROM, dry needling, taping, vasopnuematic device, vestibular, spinal manipulations, joint manipulations  PLAN FOR NEXT SESSION:  Cont with aquatic and land based therapy.  continue to progress functional mobility, strengthen L LE, TrA, and glutes muscles. Decrease patients pain and help minimize functional deficits. Pt may seek referral for Lt shoulder (plans to see ortho Dr to assess).  Cont with STW and core strengthening.   Cont with supine manual HS stretch with passive AP's.

## 2022-12-19 DIAGNOSIS — M25641 Stiffness of right hand, not elsewhere classified: Secondary | ICD-10-CM | POA: Diagnosis not present

## 2022-12-19 DIAGNOSIS — M6530 Trigger finger, unspecified finger: Secondary | ICD-10-CM | POA: Diagnosis not present

## 2022-12-19 DIAGNOSIS — M79644 Pain in right finger(s): Secondary | ICD-10-CM | POA: Diagnosis not present

## 2022-12-25 DIAGNOSIS — G4733 Obstructive sleep apnea (adult) (pediatric): Secondary | ICD-10-CM | POA: Diagnosis not present

## 2022-12-26 ENCOUNTER — Ambulatory Visit (HOSPITAL_BASED_OUTPATIENT_CLINIC_OR_DEPARTMENT_OTHER): Payer: Federal, State, Local not specified - PPO | Admitting: Physical Therapy

## 2022-12-30 ENCOUNTER — Ambulatory Visit (HOSPITAL_BASED_OUTPATIENT_CLINIC_OR_DEPARTMENT_OTHER): Payer: Federal, State, Local not specified - PPO | Admitting: Physical Therapy

## 2022-12-30 ENCOUNTER — Encounter (HOSPITAL_BASED_OUTPATIENT_CLINIC_OR_DEPARTMENT_OTHER): Payer: Self-pay | Admitting: Physical Therapy

## 2022-12-30 DIAGNOSIS — M6281 Muscle weakness (generalized): Secondary | ICD-10-CM | POA: Diagnosis not present

## 2022-12-30 DIAGNOSIS — R293 Abnormal posture: Secondary | ICD-10-CM

## 2022-12-30 DIAGNOSIS — M5459 Other low back pain: Secondary | ICD-10-CM

## 2022-12-30 NOTE — Therapy (Addendum)
OUTPATIENT PHYSICAL THERAPY THORACOLUMBAR TREATMENT  Progress Note/Recert Reporting Period 11/05/22 to 12/17/22  See note below for Objective Data and Assessment of Progress/Goals.     Patient Name: Ashley [name] MRN: [id number] DOB:1956/06/28, 66 y.o., female Today's Date: 12/30/2022   PT End of Session - 12/30/22 1523     Visit Number 14    Number of Visits 23    Date for PT Re-Evaluation 01/28/23    Authorization Type [employer] P[employer]    PT Start Time 1437    PT Stop Time 1515    PT Time Calculation (min) 38 min    Activity Tolerance Patient tolerated treatment well    Behavior During Therapy [facility] for tasks assessed/performed                 Past Medical History:  Diagnosis Date   Anemia    Anxiety 2020   Arthritis    Asthma 2013-2014   Depression    Dyspnea 2013   Fibromyalgia 09/04/2014   GERD (gastroesophageal reflux disease)    Glucose-6-phosphate dehydrogenase deficiency    Headache    History of hiatal hernia    Hypertension    Lumbar radicular pain 09/04/2014   Pre-diabetes    Sacroiliac joint pain    nonunion of left SI joint   Sleep apnea 2016   Spinal headache    TIA (transient ischemic attack)    Wears glasses    Past Surgical History:  Procedure Laterality Date   ABDOMINAL HYSTERECTOMY     BACK SURGERY  2002   BREAST LUMPECTOMY Left 1985   BREAST SURGERY     BUNIONECTOMY     COLONOSCOPY     DILATION AND CURETTAGE OF UTERUS     ESOPHAGEAL MANOMETRY [name] 11/21/2013   Procedure: ESOPHAGEAL MANOMETRY (EM);  Surgeon: Shirley Friar, MD;  Location: [facility];  Service: Endoscopy;  Laterality: [name];   FINGER ARTHROPLASTY Right 11/20/2022   Procedure: VOLAR PLATE RECONSTRUCTION/IMBRICATION;  Surgeon: Betha Loa, MD;  Location: [facility];  Service: Orthopedics;  Laterality: Right;   HAND TENODESIS Right 11/20/2022   Procedure: RIGHT LONG FINGER TENODESIS;  Surgeon: Betha Loa, MD;  Location: [facility];  Service: Orthopedics;  Laterality: Right;  60  MIN   KNEE ARTHROSCOPY W/ MENISCAL REPAIR     LUMBAR LAMINECTOMY     MULTIPLE TOOTH EXTRACTIONS     NASAL SINUS SURGERY     PAROTIDECTOMY Left 09/21/2020   Procedure: LEFT SUPERFICIAL PAROTIDECTOMY WITH NERVE MONITORING;  Surgeon: Osborn Coho, MD;  Location: [facility];  Service: ENT;  Laterality: Left;   SACROILIAC JOINT FUSION Left 08/05/2018   Procedure: REVISION LEFT SACROILIAC JOINT FUSION WITH INSTRUMENTATION AND ALLOGRAFT;  Surgeon: Estill Bamberg, MD;  Location: [facility];  Service: Orthopedics;  Laterality: Left;   SPINAL CORD STIMULATOR IMPLANT     TONSILLECTOMY     Patient Active Problem List   Diagnosis Date Noted   Mass of left parotid gland 09/21/2020   Parotid mass 09/21/2020   De Quervain's tenosynovitis 06/22/2019   Trigger middle finger of right hand 04/27/2019   Stiffness of finger joint, right 04/27/2019   Xerostomia 02/23/2019   Dry eyes 02/23/2019   Family history of diabetes mellitus 01/26/2019   Family history of ischemic heart disease (IHD) 01/26/2019   Family history of stroke 01/26/2019   Hiatal hernia 01/26/2019   Knee pain, left 01/26/2019   Sacroiliac joint dysfunction 08/05/2018   Spondylosis of lumbar region without  myelopathy or radiculopathy 05/16/2016   Generalized headaches 04/21/2016   History of migraine 04/21/2016   Menopause 04/09/2016   Benign hypertension 04/09/2016   Depressive disorder 04/09/2016   Disorder of thyroid gland 04/09/2016   Dysphagia 04/09/2016   Chronic low back pain with left-sided sciatica 03/24/2016   Asthmatic bronchitis 09/06/2014   GERD (gastroesophageal reflux disease) 09/06/2014   Allergic rhinitis 09/06/2014   Lumbar radicular pain 09/04/2014   Fibromyalgia 09/04/2014   Back pain 06/01/2013   Cubital tunnel syndrome 06/01/2013   Muscle spasms of neck 06/01/2013   Myofascial pain 06/01/2013   Neuropathic pain 06/01/2013   Right wrist pain  06/01/2013   Shoulder pain 06/01/2013   Primary localized osteoarthrosis, lower leg 12/08/2012   Encounter for routine gynecological examination 11/16/2012   Borderline hypertension 06/16/2012   Schatzki's ring 05/21/2012   G6PD deficiency 04/14/2012   Lumbar post-laminectomy syndrome 08/26/2011   SOB (shortness of breath) 04/27/2009    PCP: None  REFERRING PROVIDER: Henrine Screws, MD   REFERRING DIAG: Lumbago with sciatica, left side [M54.42], Other chronic pain [G89.29]  Rationale for Evaluation and Treatment Rehabilitation  THERAPY DIAG:  Other low back pain  Muscle weakness (generalized)  Abnormal posture  ONSET DATE: February 2016.  SUBJECTIVE:                                                                                                                                                                                           SUBJECTIVE STATEMENT: Pt has been out of therapy for a right 3rd finger surgery.  Pt states her back and leg feel the same with no changes.  Pt reports improved sx's with spinal cord stimulator.  Pt turns it up when she has flare ups which also helps.  Pt denies any adverse effects after prior Rx.  Pt reports compliance with HEP.       PERTINENT HISTORY:  SI joint fusion in 2019 and SI joint revision in 2020 ; Lumbar laminectomy and foraminatomy in 2002 Spinal cord stimulator in June 2023 Anxiety, Depression, Fibromyalgia, HTN, Pre Diabetic, TIA, Scoliosis.   PAIN:  Are you having pain? Yes: NPRS scale: 6-7/10, 4-5/10 best pain Pain location:  L post hip, post thigh to knee Pain description: Sharp pain, burning. Aggravating factors: Bending over, reaching OH, walking, standing, sitting.  Relieving factors: Opioids, rest, ice, heat, stimulator.   PRECAUTIONS: None  WEIGHT BEARING RESTRICTIONS: No  FALLS:  Has patient fallen in last 6 months? Yes. Number of falls 2, when stepping backwards onto a step, and when cleaning behind bedroom  door and trying to move something out of the way. Both  falls in posterior direction.   LIVING ENVIRONMENT: Lives with:  Lives with her mother who has dementia Lives in: House/apartment Stairs: Yes: External: 3 steps; none and 8 in back with railings.  Has following equipment at home:  Electric scooter if long distance, walking stick.   OCCUPATION: Disability due to scoring 30% on functional capacity evaluation.   PLOF: Independent  PATIENT GOALS: Pt would like to reduce her pain and be more functional.    OBJECTIVE:   DIAGNOSTIC FINDINGS:  None.    TODAY'S TREATMENT:  Reviewed pt presentation, pain level, response to prior Rx, and HEP compliance.   Pt performed: Supine alt LE extension with TrA 2x10 Supine marching with TrA 2x10 Supine SLR with TrA x10 reps Supine clams with RTB with TrA 2x10 reps S/L hip abd with TrA 2x10  Manual Therapy: Pt received STM to glute to improve soft tissue mobility, tightness, and pain in R S/L'ing with pillow b/w knees. Pt received bilat HS stretch with passive AP's for nn flossing 3x30 sec      PATIENT EDUCATION:  Education details:relevant anatomy, HEP, exercise form and rationale, and POC. Person educated: Patient Education method: Medical illustrator, verbal cues Education comprehension: verbalized understanding and returned demonstration, verbal cues required  HOME EXERCISE PROGRAM: Pt  is already performing LTR, bridges, supine HS stretch, and Piriformis stretch after reviewing. She denied a handout.  Access Code: [email or web address] URL: [email or web address] Date: 09/10/2022 Prepared by: Aaron Edelman  Exercises - Supine Transversus Abdominis Bracing - Hands on Stomach  - 2 x daily - 7 x weekly - 2 sets - 10 reps - 5 seconds hold - Supine March  - 1-2 x daily - 7 x weekly - 2 sets - 10 reps - Hooklying Clamshell with Resistance  - 1 x daily - 4 x weekly - 2 sets - 10 reps   ASSESSMENT:  CLINICAL  IMPRESSION:  Pt presents to PT for a land based Rx today.  She had finger surgery and has been absent from PT.  She continues to have significant pain and reports no change in pain or sx's.  PT had pt perform core exercises on table and PT also performed manual techniques.  Pt has hip weakness as evidenced by fatigue with supine SLR and S/L hip abduction.  Pt responded well to Rx.  She reports much improvement in pain with neural flossing reporting no pain in lower leg and her pain centralizing toward her hip.  Pt reports improved pain from 6-7/10 at beginning of Rx to 3-4/10 after Rx.     OBJECTIVE IMPAIRMENTS: decreased activity tolerance, difficulty walking, decreased balance, decreased endurance, decreased mobility, decreased ROM, decreased strength, impaired flexibility, impaired UE/LE use, postural dysfunction, and pain.  ACTIVITY LIMITATIONS: bending, lifting, carry, locomotion, cleaning, community activity, driving, and or occupation  PERSONAL FACTORS: Anxiety, Depression, Fibromyalgia, HTN, Pre Diabetic, TIA are also affecting patient's functional outcome.  REHAB POTENTIAL: Good  CLINICAL DECISION MAKING: Stable/uncomplicated  EVALUATION COMPLEXITY: Low    GOALS: Short term PT Goals Target date: 09/23/22 Pt will be I and compliant with HEP. Baseline:  Goal status: Met Pt will decrease pain by 25% overall Baseline: Goal status: Met  Long term PT goals Target date: 10/21/22 Pt will improve ROM to [facility] to improve functional mobility Baseline: Goal status: Met Pt will improve  hip/knee strength to at least 5-/5 MMT to improve functional strength Baseline: Goal status: ongoing 12/17/22 Pt will improve FOTO to at least 45% functional to show improved function  Baseline: Goal status: ongoing 12/17/22 Pt will reduce pain by overall 50% overall with usual activity Baseline: Goal status: Ongoing Pt will reduce pain to overall less than 2-3/10 with usual activity and work  activity. Baseline: Goal status: ongoing 12/17/22 Pt will be able to ambulate community distances at least 1000 ft WNL gait pattern without complaints Baseline: Goal status: New      7. Pt will improve her STS by 2.3 seconds for Minimal clinically important difference.         Baseline:         Goal status: ongoing 12/17/22       8. Pt will improve TUG by 3.4 seconds or Minimal clinically important difference.        Baseline:        Goal status: ongoing 12/17/22  PLAN: PT FREQUENCY: 1 week  PT DURATION: 6 weeks   PLANNED INTERVENTIONS (unless contraindicated): aquatic PT, Canalith repositioning, cryotherapy, Electrical stimulation, Iontophoresis with 4 mg/ml dexamethasome, Moist heat, traction, Ultrasound, gait training, Therapeutic exercise, balance training, neuromuscular re-education, patient/family education, prosthetic training, manual techniques, passive ROM, dry needling, taping, vasopnuematic device, vestibular, spinal manipulations, joint manipulations  PLAN FOR NEXT SESSION:  Cont with land based therapy.  Cont with core and LE strengthening and manual techniques including STW and neural flossing.    Audie Clear III PT, DPT 12/30/22 3:35 PM  PHYSICAL THERAPY DISCHARGE SUMMARY  Visits from Start of Care: 14  Current functional level related to goals / functional outcomes: Unable to assess due to pt not being present at discharge.  See above for prior goal reassessment.   Remaining deficits: Unable to assess due to pt not being present at discharge.   Education / Equipment:  HEP  Patient was seen in PT from 08/26/22 to 12/30/2022.  She cancelled her following appt due to not feeling well.   Pt will be considered discharged at this time.  Audie Clear III PT, DPT 12/23/23 10:16 AM

## 2023-01-02 DIAGNOSIS — M180 Bilateral primary osteoarthritis of first carpometacarpal joints: Secondary | ICD-10-CM | POA: Diagnosis not present

## 2023-01-02 DIAGNOSIS — M65332 Trigger finger, left middle finger: Secondary | ICD-10-CM | POA: Diagnosis not present

## 2023-01-08 ENCOUNTER — Ambulatory Visit (HOSPITAL_BASED_OUTPATIENT_CLINIC_OR_DEPARTMENT_OTHER): Payer: Federal, State, Local not specified - PPO

## 2023-01-15 ENCOUNTER — Ambulatory Visit (HOSPITAL_BASED_OUTPATIENT_CLINIC_OR_DEPARTMENT_OTHER): Payer: Federal, State, Local not specified - PPO | Admitting: Physical Therapy

## 2023-01-15 NOTE — Therapy (Deleted)
OUTPATIENT PHYSICAL THERAPY THORACOLUMBAR TREATMENT  Progress Note/Recert Reporting Period 11/05/22 to 12/17/22  See note below for Objective Data and Assessment of Progress/Goals.     Patient Name: Ashley [name] MRN: [id number] DOB:07-13-1956, 67 y.o., female Today's Date: 01/15/2023         Past Medical History:  Diagnosis Date   Anemia    Anxiety 2020   Arthritis    Asthma 2013-2014   Depression    Dyspnea 2013   Fibromyalgia 09/04/2014   GERD (gastroesophageal reflux disease)    Glucose-6-phosphate dehydrogenase deficiency    Headache    History of hiatal hernia    Hypertension    Lumbar radicular pain 09/04/2014   Pre-diabetes    Sacroiliac joint pain    nonunion of left SI joint   Sleep apnea 2016   Spinal headache    TIA (transient ischemic attack)    Wears glasses    Past Surgical History:  Procedure Laterality Date   ABDOMINAL HYSTERECTOMY     BACK SURGERY  2002   BREAST LUMPECTOMY Left 1985   BREAST SURGERY     BUNIONECTOMY     COLONOSCOPY     DILATION AND CURETTAGE OF UTERUS     ESOPHAGEAL MANOMETRY [name] 11/21/2013   Procedure: ESOPHAGEAL MANOMETRY (EM);  Surgeon: Lear Ng, MD;  Location: [facility];  Service: Endoscopy;  Laterality: [name];   FINGER ARTHROPLASTY Right 11/20/2022   Procedure: VOLAR PLATE RECONSTRUCTION/IMBRICATION;  Surgeon: Leanora Cover, MD;  Location: [place];  Service: Orthopedics;  Laterality: Right;   HAND TENODESIS Right 11/20/2022   Procedure: RIGHT LONG FINGER TENODESIS;  Surgeon: Leanora Cover, MD;  Location: [place];  Service: Orthopedics;  Laterality: Right;  60  MIN   KNEE ARTHROSCOPY W/ MENISCAL REPAIR     LUMBAR LAMINECTOMY     MULTIPLE TOOTH EXTRACTIONS     NASAL SINUS SURGERY     PAROTIDECTOMY Left 09/21/2020   Procedure: LEFT SUPERFICIAL PAROTIDECTOMY WITH NERVE MONITORING;  Surgeon: Jerrell Belfast, MD;  Location: [place];  Service: ENT;  Laterality: Left;    SACROILIAC JOINT FUSION Left 08/05/2018   Procedure: REVISION LEFT SACROILIAC JOINT FUSION WITH INSTRUMENTATION AND ALLOGRAFT;  Surgeon: Phylliss Bob, MD;  Location: [place];  Service: Orthopedics;  Laterality: Left;   SPINAL CORD STIMULATOR IMPLANT     TONSILLECTOMY     Patient Active Problem List   Diagnosis Date Noted   Mass of left parotid gland 09/21/2020   Parotid mass 09/21/2020   De Quervain's tenosynovitis 06/22/2019   Trigger middle finger of right hand 04/27/2019   Stiffness of finger joint, right 04/27/2019   Xerostomia 02/23/2019   Dry eyes 02/23/2019   Family history of diabetes mellitus 01/26/2019   Family history of ischemic heart disease (IHD) 01/26/2019   Family history of stroke 01/26/2019   Hiatal hernia 01/26/2019   Knee pain, left 01/26/2019   Sacroiliac joint dysfunction 08/05/2018   Spondylosis of lumbar region without myelopathy or radiculopathy 05/16/2016   Generalized headaches 04/21/2016   History of migraine 04/21/2016   Menopause 04/09/2016   Benign hypertension 04/09/2016   Depressive disorder 04/09/2016   Disorder of thyroid gland 04/09/2016   Dysphagia 04/09/2016   Chronic low back pain with left-sided sciatica 03/24/2016   Asthmatic bronchitis 09/06/2014   GERD (gastroesophageal reflux disease) 09/06/2014   Allergic rhinitis 09/06/2014   Lumbar radicular pain 09/04/2014   Fibromyalgia 09/04/2014   Back pain 06/01/2013   Cubital tunnel syndrome 06/01/2013  Muscle spasms of neck 06/01/2013   Myofascial pain 06/01/2013   Neuropathic pain 06/01/2013   Right wrist pain 06/01/2013   Shoulder pain 06/01/2013   Primary localized osteoarthrosis, lower leg 12/08/2012   Encounter for routine gynecological examination 11/16/2012   Borderline hypertension 06/16/2012   Schatzki's ring 05/21/2012   G6PD deficiency 04/14/2012   Lumbar post-laminectomy syndrome 08/26/2011   SOB (shortness of breath) 04/27/2009    PCP: None  REFERRING PROVIDER:  Aura Dials, MD   REFERRING DIAG: Lumbago with sciatica, left side [M54.42], Other chronic pain [G89.29]  Rationale for Evaluation and Treatment Rehabilitation  THERAPY DIAG:  No diagnosis found.  ONSET DATE: February 2016.  SUBJECTIVE:                                                                                                                                                                                           SUBJECTIVE STATEMENT: Pt has been out of therapy for a right 3rd finger surgery.  Pt states her back and leg feel the same with no changes.  Pt reports improved sx's with spinal cord stimulator.  Pt turns it up when she has flare ups which also helps.  Pt denies any adverse effects after prior Rx.  Pt reports compliance with HEP.       PERTINENT HISTORY:  SI joint fusion in 2019 and SI joint revision in 2020 ; Lumbar laminectomy and foraminatomy in 2002 Spinal cord stimulator in June 2023 Anxiety, Depression, Fibromyalgia, HTN, Pre Diabetic, TIA, Scoliosis.   PAIN:  Are you having pain? Yes: NPRS scale: 6-7/10, 4-5/10 best pain Pain location:  L post hip, post thigh to knee Pain description: Sharp pain, burning. Aggravating factors: Bending over, reaching OH, walking, standing, sitting.  Relieving factors: Opioids, rest, ice, heat, stimulator.   PRECAUTIONS: None  WEIGHT BEARING RESTRICTIONS: No  FALLS:  Has patient fallen in last 6 months? Yes. Number of falls 2, when stepping backwards onto a step, and when cleaning behind bedroom door and trying to move something out of the way. Both falls in posterior direction.   LIVING ENVIRONMENT: Lives with:  Lives with her mother who has dementia Lives in: House/apartment Stairs: Yes: External: 3 steps; none and 8 in back with railings.  Has following equipment at home:  Electric scooter if long distance, walking stick.   OCCUPATION: Disability due to scoring 30% on functional capacity evaluation.   PLOF:  Independent  PATIENT GOALS: Pt would like to reduce her pain and be more functional.    OBJECTIVE:   DIAGNOSTIC FINDINGS:  None.    TODAY'S TREATMENT:  Reviewed pt presentation, pain level,  response to prior Rx, and HEP compliance.   Pt performed: Supine alt LE extension with TrA 2x10 Supine marching with TrA 2x10 Supine SLR with TrA x10 reps Supine clams with RTB with TrA 2x10 reps S/L hip abd with TrA 2x10  Manual Therapy: Pt received STM to glute to improve soft tissue mobility, tightness, and pain in R S/L'ing with pillow b/w knees. Pt received bilat HS stretch with passive AP's for nn flossing 3x30 sec      PATIENT EDUCATION:  Education details:relevant anatomy, HEP, exercise form and rationale, and POC. Person educated: Patient Education method: Customer service manager, verbal cues Education comprehension: verbalized understanding and returned demonstration, verbal cues required  HOME EXERCISE PROGRAM: Pt  is already performing LTR, bridges, supine HS stretch, and Piriformis stretch after reviewing. She denied a handout.  Access Code: [email or web address] URL: [email or web address] Date: 09/10/2022 Prepared by: Ronny Flurry  Exercises - Supine Transversus Abdominis Bracing - Hands on Stomach  - 2 x daily - 7 x weekly - 2 sets - 10 reps - 5 seconds hold - Supine March  - 1-2 x daily - 7 x weekly - 2 sets - 10 reps - Hooklying Clamshell with Resistance  - 1 x daily - 4 x weekly - 2 sets - 10 reps   ASSESSMENT:  CLINICAL IMPRESSION:  Pt presents to PT for a land based Rx today.  She had finger surgery and has been absent from PT.  She continues to have significant pain and reports no change in pain or sx's.  PT had pt perform core exercises on table and PT also performed manual techniques.  Pt has hip weakness as evidenced by fatigue with supine SLR and S/L hip abduction.  Pt responded well to Rx.  She reports much improvement in pain with neural  flossing reporting no pain in lower leg and her pain centralizing toward her hip.  Pt reports improved pain from 6-7/10 at beginning of Rx to 3-4/10 after Rx.     OBJECTIVE IMPAIRMENTS: decreased activity tolerance, difficulty walking, decreased balance, decreased endurance, decreased mobility, decreased ROM, decreased strength, impaired flexibility, impaired UE/LE use, postural dysfunction, and pain.  ACTIVITY LIMITATIONS: bending, lifting, carry, locomotion, cleaning, community activity, driving, and or occupation  PERSONAL FACTORS: Anxiety, Depression, Fibromyalgia, HTN, Pre Diabetic, TIA are also affecting patient's functional outcome.  REHAB POTENTIAL: Good  CLINICAL DECISION MAKING: Stable/uncomplicated  EVALUATION COMPLEXITY: Low    GOALS: Short term PT Goals Target date: 09/23/22 Pt will be I and compliant with HEP. Baseline:  Goal status: Met Pt will decrease pain by 25% overall Baseline: Goal status: Met  Long term PT goals Target date: 10/21/22 Pt will improve ROM to [facility] to improve functional mobility Baseline: Goal status: Met Pt will improve  hip/knee strength to at least 5-/5 MMT to improve functional strength Baseline: Goal status: ongoing 12/17/22 Pt will improve FOTO to at least 45% functional to show improved function Baseline: Goal status: ongoing 12/17/22 Pt will reduce pain by overall 50% overall with usual activity Baseline: Goal status: Ongoing Pt will reduce pain to overall less than 2-3/10 with usual activity and work activity. Baseline: Goal status: ongoing 12/17/22 Pt will be able to ambulate community distances at least 1000 ft WNL gait pattern without complaints Baseline: Goal status: New      7. Pt will improve her STS by 2.3 seconds for Minimal clinically important difference.         Baseline:         Goal  status: ongoing 12/17/22       8. Pt will improve TUG by 3.4 seconds or Minimal clinically important difference.        Baseline:         Goal status: ongoing 12/17/22  PLAN: PT FREQUENCY: 1 week  PT DURATION: 6 weeks   PLANNED INTERVENTIONS (unless contraindicated): aquatic PT, Canalith repositioning, cryotherapy, Electrical stimulation, Iontophoresis with 4 mg/ml dexamethasome, Moist heat, traction, Ultrasound, gait training, Therapeutic exercise, balance training, neuromuscular re-education, patient/family education, prosthetic training, manual techniques, passive ROM, dry needling, taping, vasopnuematic device, vestibular, spinal manipulations, joint manipulations  PLAN FOR NEXT SESSION:  Cont with land based therapy.  Cont with core and LE strengthening and manual techniques including STW and neural flossing.    Selinda Michaels III PT, DPT 01/15/23 9:04 AM

## 2023-01-19 DIAGNOSIS — S6991XA Unspecified injury of right wrist, hand and finger(s), initial encounter: Secondary | ICD-10-CM | POA: Diagnosis not present

## 2023-01-20 ENCOUNTER — Encounter: Payer: Self-pay | Admitting: Emergency Medicine

## 2023-01-20 ENCOUNTER — Ambulatory Visit (INDEPENDENT_AMBULATORY_CARE_PROVIDER_SITE_OTHER): Payer: Federal, State, Local not specified - PPO | Admitting: Emergency Medicine

## 2023-01-20 VITALS — BP 138/82 | HR 78 | Temp 98.2°F | Ht 69.0 in | Wt 217.4 lb

## 2023-01-20 DIAGNOSIS — J45909 Unspecified asthma, uncomplicated: Secondary | ICD-10-CM | POA: Diagnosis not present

## 2023-01-20 DIAGNOSIS — K219 Gastro-esophageal reflux disease without esophagitis: Secondary | ICD-10-CM | POA: Diagnosis not present

## 2023-01-20 DIAGNOSIS — J301 Allergic rhinitis due to pollen: Secondary | ICD-10-CM

## 2023-01-20 NOTE — Assessment & Plan Note (Signed)
Continue your Protonix twice a day as you have been taking it.

## 2023-01-20 NOTE — Addendum Note (Signed)
Addended by: Gavin Potters R on: 01/20/2023 02:47 PM   Modules accepted: Orders

## 2023-01-20 NOTE — Assessment & Plan Note (Signed)
Continue Zyrtec once daily Consider getting back on your fluticasone nasal spray daily on a schedule during the allergy season.

## 2023-01-20 NOTE — Progress Notes (Signed)
@Patient  ID: Ashley [name], female    DOB: Sep 30, 1956, 67 y.o.   MRN: [id number]  Chief Complaint  Patient presents with   Follow-up    Wheezing improved     Referring provider: Aura Dials, MD  HPI: 67 year old female, former smoker. PMH significant for HTN, intrinsic asthma, GERD, scatzki's ring, chronic lower back pain. Former patient of Dr. Lake Bells. Last seen here 05/28/21 by BW.  Currently managed on Symbicort and Singulair, uses albuterol approximately once a month at baseline Protonix twice daily, Flonase twice daily but doesn't always use. She is now on zyrtec.  This month she has had some increased SOB and cough w pollen exposure, increased her albuterol for a few days. No flares since she was last seen here. She uses azelastine eye drops. Occasionally has some breakthrough GERD. Her flu shot is up to date, considering the RSV vaccine.    Allergies  Allergen Reactions   Latex Itching, Swelling and Other (See Comments)    redness   Sulfa Antibiotics Diarrhea and Nausea And Vomiting   Aspirin Other (See Comments)    Can cause her to bleed out due to G6 PD   Tetracyclines & Related Diarrhea and Nausea And Vomiting   Wound Dressing Adhesive     Bandaids and all tape!    Oxycodone Itching    "all over"   Tramadol Other (See Comments)    Makes her feel " drugged"    Immunization History  Administered Date(s) Administered   Influenza Split 09/03/2014   Influenza,inj,Quad PF,6+ Mos 08/06/2015, 08/27/2019, 08/14/2021   Influenza-Unspecified 09/03/2014, 08/06/2015, 08/11/2017, 08/27/2019   PFIZER(Purple Top)SARS-COV-2 Vaccination 02/16/2020, 03/08/2020, 10/30/2020   Pfizer Covid-19 Vaccine Bivalent Booster 86yrs & up 08/14/2021   Pneumococcal Polysaccharide-23 08/06/2015   Tdap 04/16/2010    Past Medical History:  Diagnosis Date   Anemia    Anxiety 2020   Arthritis    Asthma 2013-2014   Depression    Dyspnea 2013   Fibromyalgia 09/04/2014   GERD  (gastroesophageal reflux disease)    Glucose-6-phosphate dehydrogenase deficiency    Headache    History of hiatal hernia    Hypertension    Lumbar radicular pain 09/04/2014   Pre-diabetes    Sacroiliac joint pain    nonunion of left SI joint   Sleep apnea 2016   Spinal headache    TIA (transient ischemic attack)    Wears glasses     Tobacco History: Social History   Tobacco Use  Smoking Status Former   Packs/day: 0.75   Years: 24.00   Additional pack years: 0.00   Total pack years: 18.00   Types: Cigarettes   Quit date: 11/03/1992   Years since quitting: 30.2  Smokeless Tobacco Never   Counseling given: Not Answered   Outpatient Medications Prior to Visit  Medication Sig Dispense Refill   acetaminophen-codeine (TYLENOL #3) 300-30 MG tablet Take 1 tablet by mouth every 6 (six) hours as needed for moderate pain. 20 tablet 0   albuterol (VENTOLIN HFA) 108 (90 Base) MCG/ACT inhaler Inhale 1-2 puffs into the lungs every 6 (six) hours as needed for wheezing or shortness of breath. 16 g 6   atorvastatin (LIPITOR) 20 MG tablet Take 20 mg by mouth daily.      azelastine (OPTIVAR) 0.05 % ophthalmic solution Place 2 drops into both eyes 2 (two) times daily. 6 mL 4   baclofen (LIORESAL) 20 MG tablet Take 1 tablet (20 mg total) by mouth 3 (three) times  daily. 15 each 0   BELBUCA 150 MCG FILM Take 1 strip by mouth 2 (two) times daily.     budesonide-formoterol (SYMBICORT) 80-4.5 MCG/ACT inhaler Inhale 2 puffs into the lungs 2 (two) times daily. 1 each 5   diphenhydrAMINE (BENADRYL) 25 MG tablet Take 25 mg by mouth at bedtime as needed. Take with oxycodone/acetaminophen to prevent itching.     DULoxetine (CYMBALTA) 30 MG capsule Take 30 mg by mouth daily.     DULoxetine (CYMBALTA) 60 MG capsule Take 60 mg by mouth daily.      fluticasone (FLONASE) 50 MCG/ACT nasal spray Place 2 sprays into both nostrils 2 (two) times daily. 48 g 3   gabapentin (NEURONTIN) 100 MG capsule TAKE 1 CAPSULE 3  TIMES DAILY. TAKE IN ADDITION TO 800 MG TABS FOR TOTAL OF 900 MG 90 capsule 2   gabapentin (NEURONTIN) 800 MG tablet Take 1 tablet (800 mg total) by mouth 3 (three) times daily. 90 tablet 5   Galcanezumab-gnlm (EMGALITY) 120 MG/ML SOAJ Inject 1 pen. into the skin every 30 (thirty) days. 1.12 mL 11   hydrochlorothiazide (HYDRODIURIL) 12.5 MG tablet Take 12.5 mg by mouth 2 (two) times daily with breakfast and lunch.   1   losartan (COZAAR) 25 MG tablet Take 25 mg by mouth 2 (two) times daily with breakfast and lunch.   1   metformin (FORTAMET) 500 MG (OSM) 24 hr tablet Take 500 mg by mouth daily with breakfast.     montelukast (SINGULAIR) 10 MG tablet Take 1 tablet (10 mg total) by mouth at bedtime. 90 tablet 0   Multiple Vitamin (MULTIVITAMIN WITH MINERALS) TABS tablet Take 1 tablet by mouth daily at 3 pm. Centrum for Women     naproxen (EC NAPROSYN) 500 MG EC tablet Take 500 mg by mouth 2 (two) times daily with a meal.      nortriptyline (PAMELOR) 25 MG capsule Take 1 capsule (25 mg total) by mouth at bedtime. 30 capsule 0   NURTEC 75 MG TBDP DISSOLVE 1 TABLET IN MOUTH AS NEEDED 8 tablet 3   pantoprazole (PROTONIX) 40 MG tablet Take 40 mg by mouth 2 (two) times daily.      Polyethyl Glycol-Propyl Glycol (SYSTANE) 0.4-0.3 % SOLN Place 1 drop into both eyes every 6 (six) hours as needed (dry eyes). 30 mL 3   promethazine (PHENERGAN) 12.5 MG tablet Take 12.5-25 mg by mouth every 6 (six) hours as needed for nausea or vomiting.     ondansetron (ZOFRAN) 4 MG tablet Take 1 tablet (4 mg total) by mouth every 8 (eight) hours as needed for nausea or vomiting. (Patient not taking: Reported on 01/20/2023) 20 tablet 1   No facility-administered medications prior to visit.      Review of Systems  Review of Systems  Constitutional: Negative.   Respiratory:  Positive for cough. Negative for chest tightness, shortness of breath and wheezing.      Physical Exam  BP 138/82 (BP Location: Left Arm, Patient  Position: Sitting, Cuff Size: Normal)   Pulse 78   Temp 98.2 F (36.8 C) (Oral)   Ht 5\' 9"  (1.753 m)   Wt 217 lb 6.4 oz (98.6 kg)   SpO2 97%   BMI 32.10 kg/m   Gen: Pleasant, well-nourished, in no distress,  normal affect  ENT: No lesions,  mouth clear,  oropharynx clear, no postnasal drip  Neck: No JVD, no stridor  Lungs: No use of accessory muscles, no crackles or wheezing on normal  respiration, no wheeze on forced expiration  Cardiovascular: RRR, heart sounds normal, no murmur or gallops, no peripheral edema  Musculoskeletal: No deformities, no cyanosis or clubbing  Neuro: alert, awake, non focal  Skin: Warm, no lesions or rash   Lab Results:  CBC    Component Value Date/Time   WBC 4.1 12/16/2021 1454   RBC 4.45 12/16/2021 1454   HGB 12.6 12/16/2021 1454   HCT 39.8 12/16/2021 1454   PLT 216 12/16/2021 1454   MCV 89.4 12/16/2021 1454   MCH 28.3 12/16/2021 1454   MCHC 31.7 12/16/2021 1454   RDW 13.3 12/16/2021 1454   LYMPHSABS 1.6 08/05/2018 0923   MONOABS 0.4 08/05/2018 0923   EOSABS 0.2 08/05/2018 0923   BASOSABS 0.0 08/05/2018 0923    BMET    Component Value Date/Time   NA 138 11/18/2022 1228   K 3.7 11/18/2022 1228   CL 98 11/18/2022 1228   CO2 30 11/18/2022 1228   GLUCOSE 95 11/18/2022 1228   BUN 8 11/18/2022 1228   CREATININE 0.83 11/18/2022 1228   CALCIUM 9.1 11/18/2022 1228   GFRNONAA >60 11/18/2022 1228   GFRAA >60 08/05/2018 0923     Assessment & Plan:   Asthmatic bronchitis Please continue Symbicort 2 puffs twice a day.  Rinse and gargle after use. Keep albuterol available to use 2 puffs up to every 4 hours if needed for shortness of breath, chest tightness, wheezing.  Flu shot up-to-date We will give you the RSV vaccine today Follow with Dr. Lamonte Sakai in 12 months or sooner if you have any problems.   Allergic rhinitis Continue Zyrtec once daily Consider getting back on your fluticasone nasal spray daily on a schedule during the  allergy season.  GERD (gastroesophageal reflux disease) Continue your Protonix twice a day as you have been taking it.    Baltazar Apo, MD, PhD 01/20/2023, 2:32 PM [facility] [phone] or if no answer before 7:00PM call [phone] For any issues after 7:00PM please call eLink [phone]

## 2023-01-20 NOTE — Assessment & Plan Note (Signed)
Please continue Symbicort 2 puffs twice a day.  Rinse and gargle after use. Keep albuterol available to use 2 puffs up to every 4 hours if needed for shortness of breath, chest tightness, wheezing.  Flu shot up-to-date We will give you the RSV vaccine today Follow with Dr. Lamonte Sakai in 12 months or sooner if you have any problems.

## 2023-01-20 NOTE — Patient Instructions (Addendum)
Please continue Symbicort 2 puffs twice a day.  Rinse and gargle after use. Keep albuterol available to use 2 puffs up to every 4 hours if needed for shortness of breath, chest tightness, wheezing.  Continue your Protonix twice a day as you have been taking it. Continue Zyrtec once daily Consider getting back on your fluticasone nasal spray daily on a schedule during the allergy season. Flu shot up-to-date We will give you the RSV vaccine today Follow with Dr. Lamonte Sakai in 12 months or sooner if you have any problems.

## 2023-01-23 DIAGNOSIS — G4733 Obstructive sleep apnea (adult) (pediatric): Secondary | ICD-10-CM | POA: Diagnosis not present

## 2023-01-30 ENCOUNTER — Other Ambulatory Visit: Payer: Self-pay | Admitting: Psychiatry

## 2023-02-02 NOTE — Telephone Encounter (Signed)
Last seen on 02/11/22 No follow up scheduled  Last filled on 12/23/22 # 90 tablets (30 day supply)

## 2023-02-13 DIAGNOSIS — M654 Radial styloid tenosynovitis [de Quervain]: Secondary | ICD-10-CM | POA: Diagnosis not present

## 2023-02-13 DIAGNOSIS — M20031 Swan-neck deformity of right finger(s): Secondary | ICD-10-CM | POA: Diagnosis not present

## 2023-02-13 DIAGNOSIS — M65332 Trigger finger, left middle finger: Secondary | ICD-10-CM | POA: Diagnosis not present

## 2023-02-13 DIAGNOSIS — M180 Bilateral primary osteoarthritis of first carpometacarpal joints: Secondary | ICD-10-CM | POA: Diagnosis not present

## 2023-02-23 DIAGNOSIS — G4733 Obstructive sleep apnea (adult) (pediatric): Secondary | ICD-10-CM | POA: Diagnosis not present

## 2023-03-08 ENCOUNTER — Other Ambulatory Visit: Payer: Self-pay | Admitting: Psychiatry

## 2023-03-09 NOTE — Telephone Encounter (Signed)
Pt last seen on 02/01/22 "Increasing gabapentin to 900 mg TID has helped reduce her neuropathy which has helped her sleep at night." No 1 year  follow up scheduled  Last filled on 02/02/23 #90 tablets (30 day supply)

## 2023-03-10 ENCOUNTER — Telehealth: Payer: Self-pay | Admitting: Psychiatry

## 2023-03-10 ENCOUNTER — Other Ambulatory Visit: Payer: Self-pay

## 2023-03-10 NOTE — Telephone Encounter (Signed)
Pt called stating that the pharmacy is not refilling her  Galcanezumab-gnlm (EMGALITY) 120 MG/ML SOAJ  and she is wanting to know why. Please advise.

## 2023-03-10 NOTE — Telephone Encounter (Signed)
Called pt and let her know that she needs to make an appointment because we haven't seen her in 1 year for refills. Pt verbalized understanding.

## 2023-03-18 MED ORDER — EMGALITY 120 MG/ML ~~LOC~~ SOAJ: 1.0000 "pen " | SUBCUTANEOUS | 1 refills | Status: DC

## 2023-03-18 NOTE — Addendum Note (Signed)
Addended by: Aura Camps on: 03/18/2023 02:19 PM   Modules accepted: Orders

## 2023-03-18 NOTE — Telephone Encounter (Signed)
Received PA request for Emgality on covermymeds. Key: [id number]. Submitted. Waiting on determination from [employer].

## 2023-03-18 NOTE — Telephone Encounter (Addendum)
I called patient reports she has migraine and has not had her Emaglity injection since April. Pt is scheduled to see Maralyn Sago on 05/06/23. Pt said Emaglity helps her and she was not aware that she was due to a visit. Pt aware she must keep scheduled visit. Pt states she has been taking Nurtec and no relief.  Rx sent.

## 2023-03-18 NOTE — Telephone Encounter (Signed)
Pt requesting a call from nurse to discuss her headache medication. Informed pt she may need to follow-up with ER or Urgent Care until up coming appointment with Maralyn Sago.

## 2023-03-19 NOTE — Telephone Encounter (Signed)
Received fax from [facility] FEP clinical call center that PA approved 02/16/23-03/17/24.

## 2023-04-03 DIAGNOSIS — Z9689 Presence of other specified functional implants: Secondary | ICD-10-CM | POA: Diagnosis not present

## 2023-04-10 DIAGNOSIS — G8929 Other chronic pain: Secondary | ICD-10-CM | POA: Diagnosis not present

## 2023-04-10 DIAGNOSIS — G5793 Unspecified mononeuropathy of bilateral lower limbs: Secondary | ICD-10-CM | POA: Diagnosis not present

## 2023-04-10 DIAGNOSIS — G5782 Other specified mononeuropathies of left lower limb: Secondary | ICD-10-CM | POA: Diagnosis not present

## 2023-04-10 DIAGNOSIS — G5791 Unspecified mononeuropathy of right lower limb: Secondary | ICD-10-CM | POA: Diagnosis not present

## 2023-04-10 DIAGNOSIS — M7918 Myalgia, other site: Secondary | ICD-10-CM | POA: Diagnosis not present

## 2023-04-10 DIAGNOSIS — G580 Intercostal neuropathy: Secondary | ICD-10-CM | POA: Diagnosis not present

## 2023-05-02 ENCOUNTER — Telehealth: Payer: Self-pay | Admitting: Pharmacy Technician

## 2023-05-02 NOTE — Telephone Encounter (Signed)
Pharmacy Patient Advocate Encounter   Received notification from CoverMyMeds that prior authorization for Nurtec 75MG  dispersible tablets is required/requested.   I PA started via CoverMyMeds. KEY [id number] . Waiting for clinical questions to populate.

## 2023-05-04 NOTE — Telephone Encounter (Signed)
Clinical questions have been answered and PA submitted on 05/04/2023.

## 2023-05-04 NOTE — Telephone Encounter (Signed)
Pharmacy Patient Advocate Encounter  Prior Authorization for Nurtec 75MG  dispersible tablets has been APPROVED by [facility] from 05/04/2023 to 05/02/2024.   PA # PA Case ID #: [id number]

## 2023-05-06 ENCOUNTER — Encounter: Payer: Self-pay | Admitting: Neurology

## 2023-05-06 ENCOUNTER — Ambulatory Visit (INDEPENDENT_AMBULATORY_CARE_PROVIDER_SITE_OTHER): Payer: Medicare Other | Admitting: Neurology

## 2023-05-06 VITALS — BP 125/75 | HR 97 | Ht 69.0 in | Wt 213.5 lb

## 2023-05-06 DIAGNOSIS — G43709 Chronic migraine without aura, not intractable, without status migrainosus: Secondary | ICD-10-CM | POA: Diagnosis not present

## 2023-05-06 MED ORDER — EMGALITY 120 MG/ML ~~LOC~~ SOAJ: 1.0000 "pen " | SUBCUTANEOUS | 11 refills | Status: DC

## 2023-05-06 MED ORDER — NURTEC 75 MG PO TBDP: ORAL_TABLET | ORAL | 11 refills | Status: DC

## 2023-05-06 NOTE — Patient Instructions (Signed)
I am glad you are doing well! We will continue current medications Please let me know if you need anything. See you in 1 year! Thanks!!
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
## 2023-05-08 ENCOUNTER — Emergency Department (HOSPITAL_COMMUNITY): Payer: Medicare Other

## 2023-05-08 ENCOUNTER — Encounter (HOSPITAL_COMMUNITY): Payer: Self-pay

## 2023-05-08 ENCOUNTER — Emergency Department (HOSPITAL_COMMUNITY)
Admission: EM | Admit: 2023-05-08 | Discharge: 2023-05-08 | Disposition: A | Discharge status: Home / Self Care | Payer: Medicare Other | Attending: Emergency Medicine | Admitting: Emergency Medicine

## 2023-05-08 ENCOUNTER — Other Ambulatory Visit: Payer: Self-pay

## 2023-05-08 DIAGNOSIS — R051 Acute cough: Secondary | ICD-10-CM

## 2023-05-08 DIAGNOSIS — I10 Essential (primary) hypertension: Secondary | ICD-10-CM | POA: Diagnosis not present

## 2023-05-08 DIAGNOSIS — U071 COVID-19: Secondary | ICD-10-CM | POA: Diagnosis not present

## 2023-05-08 DIAGNOSIS — R059 Cough, unspecified: Secondary | ICD-10-CM | POA: Diagnosis present

## 2023-05-08 DIAGNOSIS — Z91040 Latex allergy status: Secondary | ICD-10-CM | POA: Insufficient documentation

## 2023-05-08 DIAGNOSIS — Z79899 Other long term (current) drug therapy: Secondary | ICD-10-CM | POA: Insufficient documentation

## 2023-05-08 MED ORDER — PAXLOVID (300/100) 20 X 150 MG & 10 X 100MG PO TBPK: 3.0000 | ORAL_TABLET | Freq: Two times a day (BID) | ORAL | 0 refills | Status: DC

## 2023-05-08 MED ORDER — PAXLOVID (300/100) 20 X 150 MG & 10 X 100MG PO TBPK: 3.0000 | ORAL_TABLET | Freq: Two times a day (BID) | ORAL | 0 refills | Status: AC

## 2023-05-08 NOTE — ED Notes (Signed)
The pt was ill since Wednesday with a low grade fever   she took a covid test today that was positive

## 2023-05-08 NOTE — ED Provider Notes (Signed)
[place] EMERGENCY DEPARTMENT AT [facility] Provider Note   CSN: [id number] Arrival date & time: 05/08/23  1759     History  Chief Complaint  Patient presents with   Covid Positive    Ashley [name] is a 67 y.o. female with history of anxiety, sleep apnea, fibromyalgia, GERD, depression, hypertension who presents to the emergency department complaining of cough, headache, and bodyaches for the past 6 days.  Patient states that she took an at home COVID test that was positive.  She is having some chest pain and bilateral rib pain with coughing. Has been taking OTC meds and using nasal sprays without relief.   HPI     Home Medications Prior to Admission medications   Medication Sig Start Date End Date Taking? Authorizing Provider  acetaminophen-codeine (TYLENOL #3) 300-30 MG tablet Take 1 tablet by mouth every 6 (six) hours as needed for moderate pain. 11/20/22   Betha Loa, MD  albuterol (VENTOLIN HFA) 108 (90 Base) MCG/ACT inhaler Inhale 1-2 puffs into the lungs every 6 (six) hours as needed for wheezing or shortness of breath. 10/24/22   Leslye Peer, MD  atorvastatin (LIPITOR) 20 MG tablet Take 20 mg by mouth daily.  02/17/19   [provider]  azelastine (OPTIVAR) 0.05 % ophthalmic solution Place 2 drops into both eyes 2 (two) times daily. 10/24/22   Leslye Peer, MD  baclofen (LIORESAL) 20 MG tablet Take 1 tablet (20 mg total) by mouth 3 (three) times daily. 07/01/15   Trixie Dredge, PA-C  BELBUCA 150 MCG FILM Take 1 strip by mouth 2 (two) times daily. 07/05/21   [provider]  budesonide-formoterol (SYMBICORT) 80-4.5 MCG/ACT inhaler Inhale 2 puffs into the lungs 2 (two) times daily. 10/24/22   Leslye Peer, MD  diphenhydrAMINE (BENADRYL) 25 MG tablet Take 25 mg by mouth at bedtime as needed. Take with oxycodone/acetaminophen to prevent itching.    [provider]  DULoxetine (CYMBALTA) 30 MG capsule Take 30 mg by mouth daily.  08/22/20   [provider]  DULoxetine (CYMBALTA) 60 MG capsule Take 60 mg by mouth daily.     [provider]  fluticasone (FLONASE) 50 MCG/ACT nasal spray Place 2 sprays into both nostrils 2 (two) times daily. 08/27/17   Leslye Peer, MD  gabapentin (NEURONTIN) 100 MG capsule TAKE 1 CAPSULE BY MOUTH 3 TIMES DAILY. TAKE IN ADDITION TO 800 MG TABS FOR TOTAL OF 900 MG 03/09/23   Ocie Doyne, MD  gabapentin (NEURONTIN) 800 MG tablet Take 1 tablet (800 mg total) by mouth 3 (three) times daily. 07/07/17   Kirsteins, Victorino Sparrow, MD  Galcanezumab-gnlm (EMGALITY) 120 MG/ML SOAJ Inject 1 pen  into the skin every 30 (thirty) days. 05/06/23   Glean Salvo, NP  hydrochlorothiazide (HYDRODIURIL) 12.5 MG tablet Take 12.5 mg by mouth 2 (two) times daily with breakfast and lunch.  07/23/18   [provider]  losartan (COZAAR) 25 MG tablet Take 25 mg by mouth 2 (two) times daily with breakfast and lunch.  07/23/18   [provider]  montelukast (SINGULAIR) 10 MG tablet Take 1 tablet (10 mg total) by mouth at bedtime. 04/27/19   Leslye Peer, MD  Multiple Vitamin (MULTIVITAMIN WITH MINERALS) TABS tablet Take 1 tablet by mouth daily at 3 pm. Centrum for Women    [provider]  naproxen (EC NAPROSYN) 500 MG EC tablet Take 500 mg by mouth 2 (two) times daily with a meal.  02/23/13   [provider]  nirmatrelvir & ritonavir (PAXLOVID, 300/100,) 20 x 150 MG & 10 x 100MG  TBPK Take 3 tablets by mouth 2 (two) times daily for 5 days. 05/08/23 05/13/23  Shaunte Tuft T, PA-C  nortriptyline (PAMELOR) 25 MG capsule Take 1 capsule (25 mg total) by mouth at bedtime. 04/03/17   Nilda Riggs, NP  ondansetron (ZOFRAN) 4 MG tablet Take 1 tablet (4 mg total) by mouth every 8 (eight) hours as needed for nausea or vomiting. 04/21/16   Nilda Riggs, NP  pantoprazole (PROTONIX) 40 MG tablet Take 40 mg by mouth 2 (two) times daily.     [provider]  Polyethyl  Glycol-Propyl Glycol (SYSTANE) 0.4-0.3 % SOLN Place 1 drop into both eyes every 6 (six) hours as needed (dry eyes). 10/24/22   Leslye Peer, MD  promethazine (PHENERGAN) 12.5 MG tablet Take 12.5-25 mg by mouth every 6 (six) hours as needed for nausea or vomiting.    [provider]  Rimegepant Sulfate (NURTEC) 75 MG TBDP DISSOLVE 1 TABLET IN MOUTH AS NEEDED 05/06/23   Glean Salvo, NP  Semaglutide (OZEMPIC, 0.25 OR 0.5 MG/DOSE, [place]) Inject 2.5 mg into the skin every 7 (seven) days.    [provider]      Allergies    Latex, Sulfa antibiotics, Aspirin, Tetracyclines & related, Wound dressing adhesive, Oxycodone, and Tramadol    Review of Systems   Review of Systems  Respiratory:  Positive for cough.   Musculoskeletal:  Positive for myalgias.  Neurological:  Positive for headaches.  All other systems reviewed and are negative.   Physical Exam Updated Vital Signs BP (!) 154/84   Pulse 77   Temp 98.6 F (37 C)   Resp 18   Ht 5\' 9"  (1.753 m)   Wt 94.8 kg   SpO2 100%   BMI 30.86 kg/m  Physical Exam Vitals and nursing note reviewed.  Constitutional:      Appearance: Normal appearance.  HENT:     Head: Normocephalic and atraumatic.     Nose: Congestion present.  Eyes:     Conjunctiva/sclera: Conjunctivae normal.  Cardiovascular:     Rate and Rhythm: Normal rate and regular rhythm.  Pulmonary:     Effort: Pulmonary effort is normal. No respiratory distress.     Breath sounds: Normal breath sounds.  Abdominal:     General: There is no distension.     Palpations: Abdomen is soft.     Tenderness: There is no abdominal tenderness.  Skin:    General: Skin is warm and dry.  Neurological:     General: No focal deficit present.     Mental Status: She is alert.     ED Results / Procedures / Treatments   Labs (all labs ordered are listed, but only abnormal results are displayed) Labs Reviewed - No data to display  EKG None  Radiology DG Chest Portable  1 View  Result Date: 05/08/2023 CLINICAL DATA:  COVID positive, chest pain, cough EXAM: PORTABLE CHEST 1 VIEW COMPARISON:  05/25/2020 FINDINGS: Heart and mediastinal contours are within normal limits. No focal opacities or effusions. No acute bony abnormality. Spinal stimulator wires in place in the midthoracic spine. IMPRESSION: No active cardiopulmonary disease. Electronically Signed   By: Charlett Nose M.D.   On: 05/08/2023 21:02    Procedures Procedures    Medications Ordered in ED Medications - No data to display  ED Course/ Medical Decision Making/ A&P  Medical Decision Making Amount and/or Complexity of Data Reviewed Radiology: ordered.  This patient is a 67 y.o. female  who presents to the ED for concern of symptoms related to COVID-19.   Past Medical History / Co-morbidities / Social History: anxiety, sleep apnea, fibromyalgia, GERD, depression, hypertension  Physical Exam: Physical exam performed. The pertinent findings include: Hypertensive, otherwise normal vital signs, no acute distress.  Nasal congestion.  No difficulty breathing, normal respiratory effort.  Lung sounds clear.  Lab Tests/Imaging studies: I personally interpreted labs/imaging and the pertinent results include: X-ray without acute abnormalities. I agree with the radiologist interpretation.  Disposition: After consideration of the diagnostic results and the patients response to treatment, I feel that emergency department workup does not suggest an emergent condition requiring admission or immediate intervention beyond what has been performed at this time. The plan is: Discharged home with symptomatic management of COVID-19 infection.  Patient clinically very well-appearing.  Does not meet SIRS.  Although patient originally had a cough starting 6 days ago, she started feeling significantly worse 2 days ago.  We talked about the risk versus benefit of initiating the antiviral  medication.  Patient would like to try it at this time, and I think this is reasonable.  I reviewed her home medications and instructed her to hold her statin while taking Paxlovid.  The patient is safe for discharge and has been instructed to return immediately for worsening symptoms, change in symptoms or any other concerns.  Final Clinical Impression(s) / ED Diagnoses Final diagnoses:  COVID-19  Acute cough    Rx / DC Orders ED Discharge Orders          Ordered    nirmatrelvir & ritonavir (PAXLOVID, 300/100,) 20 x 150 MG & 10 x 100MG  TBPK  2 times daily        05/08/23 2131           Portions of this report may have been transcribed using voice recognition software. Every effort was made to ensure accuracy; however, inadvertent computerized transcription errors may be present.    Jeanella Flattery 05/08/23 2134    Gwyneth Sprout, MD 05/08/23 2214

## 2023-05-08 NOTE — ED Triage Notes (Signed)
Pt arrived POV. Pt c/o cough, headache, body aches. Pt took at home covid test and it came back positive. Pt has test w/ her. Pt reports CP when she coughs and bilateral rib pain from coughing. Onset of s/s 6-30.

## 2023-05-08 NOTE — Discharge Instructions (Addendum)
You were seen in the emergency department for symptoms related to COVID-19 infection.  Your chest x-ray looked reassuring today, did not see any evidence of pneumonia.  As we discussed, I will be prescribing you the antiviral medication called Paxlovid.  Although we do not know exactly when you contracted the infection, I think it is reasonable to attempt the antiviral and see if it gives you relief.   Please hold/stop taking your atorvastatin (lipitor) while taking paxlovid.   Continue taking ibuprofen and/or Tylenol as needed for pain.  Continue to monitor how you're doing and return to the ER for new or worsening symptoms.

## 2023-05-16 ENCOUNTER — Emergency Department (HOSPITAL_BASED_OUTPATIENT_CLINIC_OR_DEPARTMENT_OTHER)
Admission: EM | Admit: 2023-05-16 | Discharge: 2023-05-16 | Disposition: A | Discharge status: Home / Self Care | Payer: Federal, State, Local not specified - PPO | Attending: Emergency Medicine | Admitting: Emergency Medicine

## 2023-05-16 ENCOUNTER — Other Ambulatory Visit: Payer: Self-pay

## 2023-05-16 ENCOUNTER — Emergency Department (HOSPITAL_BASED_OUTPATIENT_CLINIC_OR_DEPARTMENT_OTHER): Payer: Federal, State, Local not specified - PPO

## 2023-05-16 DIAGNOSIS — R1084 Generalized abdominal pain: Secondary | ICD-10-CM | POA: Diagnosis not present

## 2023-05-16 DIAGNOSIS — Z7951 Long term (current) use of inhaled steroids: Secondary | ICD-10-CM | POA: Insufficient documentation

## 2023-05-16 DIAGNOSIS — I10 Essential (primary) hypertension: Secondary | ICD-10-CM | POA: Insufficient documentation

## 2023-05-16 DIAGNOSIS — Z79899 Other long term (current) drug therapy: Secondary | ICD-10-CM | POA: Diagnosis not present

## 2023-05-16 DIAGNOSIS — Z91040 Latex allergy status: Secondary | ICD-10-CM | POA: Insufficient documentation

## 2023-05-16 DIAGNOSIS — J45909 Unspecified asthma, uncomplicated: Secondary | ICD-10-CM | POA: Diagnosis not present

## 2023-05-16 DIAGNOSIS — R160 Hepatomegaly, not elsewhere classified: Secondary | ICD-10-CM | POA: Insufficient documentation

## 2023-05-16 DIAGNOSIS — R1031 Right lower quadrant pain: Secondary | ICD-10-CM | POA: Diagnosis present

## 2023-05-16 LAB — COMPREHENSIVE METABOLIC PANEL
ALT: 16 U/L (ref 0–44)
AST: 17 U/L (ref 15–41)
Albumin: 4.8 g/dL (ref 3.5–5.0)
Alkaline Phosphatase: 90 U/L (ref 38–126)
Anion gap: 11 (ref 5–15)
BUN: 17 mg/dL (ref 8–23)
CO2: 27 mmol/L (ref 22–32)
Calcium: 10.3 mg/dL (ref 8.9–10.3)
Chloride: 103 mmol/L (ref 98–111)
Creatinine, Ser: 0.76 mg/dL (ref 0.44–1.00)
GFR, Estimated: >60 mL/min (ref >60)
Glucose, Bld: 91 mg/dL (ref 70–99)
Potassium: 3.6 mmol/L (ref 3.5–5.1)
Sodium: 141 mmol/L (ref 135–145)
Total Bilirubin: 0.9 mg/dL (ref 0.3–1.2)
Total Protein: 7.5 g/dL (ref 6.5–8.1)

## 2023-05-16 LAB — URINALYSIS, ROUTINE W REFLEX MICROSCOPIC
Bacteria, UA: NONE SEEN
Bilirubin Urine: NEGATIVE
Glucose, UA: NEGATIVE mg/dL
Hgb urine dipstick: NEGATIVE
Nitrite: NEGATIVE
Protein, ur: 30 mg/dL — AB
Specific Gravity, Urine: 1.032 — ABNORMAL HIGH (ref 1.005–1.030)
pH: 6 (ref 5.0–8.0)

## 2023-05-16 LAB — CBC
HCT: 45 % (ref 36.0–46.0)
Hemoglobin: 14.4 g/dL (ref 12.0–15.0)
MCH: 27.7 pg (ref 26.0–34.0)
MCHC: 32 g/dL (ref 30.0–36.0)
MCV: 86.5 fL (ref 80.0–100.0)
Platelets: 271 10*3/uL (ref 150–400)
RBC: 5.2 MIL/uL — ABNORMAL HIGH (ref 3.87–5.11)
RDW: 13.3 % (ref 11.5–15.5)
WBC: 4.8 10*3/uL (ref 4.0–10.5)
nRBC: 0 % (ref 0.0–0.2)

## 2023-05-16 LAB — LIPASE, BLOOD: Lipase: 21 U/L (ref 11–51)

## 2023-05-16 MED ORDER — ONDANSETRON 4 MG PO TBDP: 4.0000 mg | ORAL_TABLET | Freq: Three times a day (TID) | ORAL | 0 refills | Status: AC | PRN

## 2023-05-16 MED ORDER — SODIUM CHLORIDE 0.9 % IV BOLUS
1000.0000 mL | Freq: Once | INTRAVENOUS | Status: AC
  Administered 2023-05-16: 1000 mL via INTRAVENOUS

## 2023-05-16 MED ORDER — IOHEXOL 300 MG/ML  SOLN
100.0000 mL | Freq: Once | INTRAMUSCULAR | Status: AC | PRN
  Administered 2023-05-16: 100 mL via INTRAVENOUS

## 2023-05-16 MED ORDER — ONDANSETRON HCL 4 MG/2ML IJ SOLN
4.0000 mg | Freq: Once | INTRAMUSCULAR | Status: AC
  Administered 2023-05-16: 4 mg via INTRAVENOUS
  Filled 2023-05-16: qty 2

## 2023-05-16 NOTE — ED Notes (Addendum)
Difficult phleb stick in triage. Labs sent. No PIV. Cannot provide UA sample at this time.

## 2023-05-16 NOTE — ED Provider Notes (Signed)
[facility] EMERGENCY DEPARTMENT AT [facility] Provider Note   CSN: [id number] Arrival date & time: 05/16/23  1612     History  Chief Complaint  Patient presents with   Emesis   Abdominal Pain    Ashley [name] is a 67 y.o. female.  Patient with history of G6PD, GERD, hypertension, asthma presents today with complaints of nausea, vomiting, and abdominal pain.  She states that same began proximately 3 days ago and has been persistent since then.  She states that she was recently diagnosed with COVID and just finished Paxlovid for this.  Her pain is in the right lower quadrant does not radiate.  She denies any history of similar symptoms previously.  She denies any diarrhea, had a normal bowel movement yesterday.  She denies any fevers, chills, urinary symptoms, hematemesis, hematochezia. Denies any history of abdominal surgeries.   The history is provided by the patient. No language interpreter was used.  Emesis Associated symptoms: abdominal pain   Abdominal Pain Associated symptoms: nausea and vomiting        Home Medications Prior to Admission medications   Medication Sig Start Date End Date Taking? Authorizing Provider  acetaminophen-codeine (TYLENOL #3) 300-30 MG tablet Take 1 tablet by mouth every 6 (six) hours as needed for moderate pain. 11/20/22   Betha Loa, MD  albuterol (VENTOLIN HFA) 108 (90 Base) MCG/ACT inhaler Inhale 1-2 puffs into the lungs every 6 (six) hours as needed for wheezing or shortness of breath. 10/24/22   Leslye Peer, MD  atorvastatin (LIPITOR) 20 MG tablet Take 20 mg by mouth daily.  02/17/19   [provider]  azelastine (OPTIVAR) 0.05 % ophthalmic solution Place 2 drops into both eyes 2 (two) times daily. 10/24/22   Leslye Peer, MD  baclofen (LIORESAL) 20 MG tablet Take 1 tablet (20 mg total) by mouth 3 (three) times daily. 07/01/15   Trixie Dredge, PA-C  BELBUCA 150 MCG FILM Take 1 strip by mouth 2 (two) times  daily. 07/05/21   [provider]  budesonide-formoterol (SYMBICORT) 80-4.5 MCG/ACT inhaler Inhale 2 puffs into the lungs 2 (two) times daily. 10/24/22   Leslye Peer, MD  diphenhydrAMINE (BENADRYL) 25 MG tablet Take 25 mg by mouth at bedtime as needed. Take with oxycodone/acetaminophen to prevent itching.    [provider]  DULoxetine (CYMBALTA) 30 MG capsule Take 30 mg by mouth daily. 08/22/20   [provider]  DULoxetine (CYMBALTA) 60 MG capsule Take 60 mg by mouth daily.     [provider]  fluticasone (FLONASE) 50 MCG/ACT nasal spray Place 2 sprays into both nostrils 2 (two) times daily. 08/27/17   Leslye Peer, MD  gabapentin (NEURONTIN) 100 MG capsule TAKE 1 CAPSULE BY MOUTH 3 TIMES DAILY. TAKE IN ADDITION TO 800 MG TABS FOR TOTAL OF 900 MG 03/09/23   Ocie Doyne, MD  gabapentin (NEURONTIN) 800 MG tablet Take 1 tablet (800 mg total) by mouth 3 (three) times daily. 07/07/17   Kirsteins, Victorino Sparrow, MD  Galcanezumab-gnlm (EMGALITY) 120 MG/ML SOAJ Inject 1 pen  into the skin every 30 (thirty) days. 05/06/23   Glean Salvo, NP  hydrochlorothiazide (HYDRODIURIL) 12.5 MG tablet Take 12.5 mg by mouth 2 (two) times daily with breakfast and lunch.  07/23/18   [provider]  losartan (COZAAR) 25 MG tablet Take 25 mg by mouth 2 (two) times daily with breakfast and lunch.  07/23/18   [provider]  montelukast (SINGULAIR) 10 MG  tablet Take 1 tablet (10 mg total) by mouth at bedtime. 04/27/19   Leslye Peer, MD  Multiple Vitamin (MULTIVITAMIN WITH MINERALS) TABS tablet Take 1 tablet by mouth daily at 3 pm. [facility]    [provider]  naproxen (EC NAPROSYN) 500 MG EC tablet Take 500 mg by mouth 2 (two) times daily with a meal.  02/23/13   [provider]  nortriptyline (PAMELOR) 25 MG capsule Take 1 capsule (25 mg total) by mouth at bedtime. 04/03/17   Nilda Riggs, NP  ondansetron (ZOFRAN) 4 MG tablet Take 1  tablet (4 mg total) by mouth every 8 (eight) hours as needed for nausea or vomiting. 04/21/16   Nilda Riggs, NP  pantoprazole (PROTONIX) 40 MG tablet Take 40 mg by mouth 2 (two) times daily.     [provider]  Polyethyl Glycol-Propyl Glycol (SYSTANE) 0.4-0.3 % SOLN Place 1 drop into both eyes every 6 (six) hours as needed (dry eyes). 10/24/22   Leslye Peer, MD  promethazine (PHENERGAN) 12.5 MG tablet Take 12.5-25 mg by mouth every 6 (six) hours as needed for nausea or vomiting.    [provider]  Rimegepant Sulfate (NURTEC) 75 MG TBDP DISSOLVE 1 TABLET IN MOUTH AS NEEDED 05/06/23   Glean Salvo, NP  Semaglutide (OZEMPIC, 0.25 OR 0.5 MG/DOSE, [place]) Inject 2.5 mg into the skin every 7 (seven) days.    [provider]      Allergies    Latex, Sulfa antibiotics, Aspirin, Tetracyclines & related, Wound dressing adhesive, Oxycodone, and Tramadol    Review of Systems   Review of Systems  Gastrointestinal:  Positive for abdominal pain, nausea and vomiting.  All other systems reviewed and are negative.   Physical Exam Updated Vital Signs BP 136/74 (BP Location: Right Arm)   Pulse 84   Temp 98.8 F (37.1 C) (Oral)   Resp 18   SpO2 98%  Physical Exam Vitals and nursing note reviewed.  Constitutional:      General: She is not in acute distress.    Appearance: Normal appearance. She is normal weight. She is not ill-appearing, toxic-appearing or diaphoretic.  HENT:     Head: Normocephalic and atraumatic.  Cardiovascular:     Rate and Rhythm: Normal rate.  Pulmonary:     Effort: Pulmonary effort is normal. No respiratory distress.  Abdominal:     General: Abdomen is flat.     Palpations: Abdomen is soft.     Tenderness: There is abdominal tenderness in the right lower quadrant.  Musculoskeletal:        General: Normal range of motion.     Cervical back: Normal range of motion.  Skin:    General: Skin is warm and dry.  Neurological:     General:  No focal deficit present.     Mental Status: She is alert.  Psychiatric:        Mood and Affect: Mood normal.        Behavior: Behavior normal.     ED Results / Procedures / Treatments   Labs (all labs ordered are listed, but only abnormal results are displayed) Labs Reviewed  CBC - Abnormal; Notable for the following components:      Result Value   RBC 5.20 (*)    All other components within normal limits  URINALYSIS, ROUTINE W REFLEX MICROSCOPIC - Abnormal; Notable for the following components:   Specific Gravity, Urine 1.032 (*)    Ketones, ur  TRACE (*)    Protein, ur 30 (*)    Leukocytes,Ua SMALL (*)    All other components within normal limits  LIPASE, BLOOD  COMPREHENSIVE METABOLIC PANEL    EKG EKG Interpretation Date/Time:  Saturday May 16 2023 16:46:39 EDT Ventricular Rate:  85 PR Interval:  148 QRS Duration:  76 QT Interval:  382 QTC Calculation: 454 R Axis:   63  Text Interpretation: Normal sinus rhythm Nonspecific ST abnormality Abnormal ECG When compared with ECG of 18-Nov-2022 09:43, No significant change was found when compraed to prior, overall similar appearance. No STEMI Confirmed by Theda Belfast ([id number]) on 05/16/2023 8:08:35 PM  Radiology CT ABDOMEN PELVIS W CONTRAST  Result Date: 05/16/2023 CLINICAL DATA:  Right lower quadrant pain EXAM: CT ABDOMEN AND PELVIS WITH CONTRAST TECHNIQUE: Multidetector CT imaging of the abdomen and pelvis was performed using the standard protocol following bolus administration of intravenous contrast. RADIATION DOSE REDUCTION: This exam was performed according to the departmental dose-optimization program which includes automated exposure control, adjustment of the mA and/or kV according to patient size and/or use of iterative reconstruction technique. CONTRAST:  OMNIPAQUE IOHEXOL 300 MG/ML  SOLN COMPARISON:  Pelvic CT 02/14/2021 FINDINGS: Lower chest: Lung bases demonstrate no acute airspace disease. Hepatobiliary: 1.5  cm hyperenhancing mass in the posterior right hepatic lobe, series 2, image 11. No other focal liver abnormalities are seen. No calcified gallstone or biliary dilatation Pancreas: Unremarkable. No pancreatic ductal dilatation or surrounding inflammatory changes. Spleen: Normal in size without focal abnormality. Adrenals/Urinary Tract: Adrenal glands are unremarkable. Kidneys are normal, without renal calculi, focal lesion, or hydronephrosis. Bladder is unremarkable. Stomach/Bowel: Stomach is within normal limits. Appendix appears normal. No evidence of bowel wall thickening, distention, or inflammatory changes. Vascular/Lymphatic: Mild aortic atherosclerosis. No aneurysm. No suspicious lymph nodes Reproductive: Status post hysterectomy. No adnexal masses. Other: Negative for pelvic effusion or free air. Small fat containing umbilical hernia Musculoskeletal: Fusion across left SI joint. Stimulator generator in the right posterior soft tissues. Scoliosis and degenerative changes of the spine. IMPRESSION: 1. Negative for acute appendicitis. No CT evidence for acute intra-abdominal or pelvic abnormality. 2. 1.5 cm hyperenhancing mass in the posterior right hepatic lobe, possible hemangioma. When the patient is clinically stable and able to follow directions and hold their breath (preferably as an outpatient) further evaluation with dedicated abdominal MRI should be considered. 3. Aortic atherosclerosis. Aortic Atherosclerosis (ICD10-I70.0). Electronically Signed   By: Jasmine Pang M.D.   On: 05/16/2023 21:05    Procedures Procedures    Medications Ordered in ED Medications  sodium chloride 0.9 % bolus 1,000 mL (0 mLs Intravenous Stopped 05/16/23 2139)  ondansetron (ZOFRAN) injection 4 mg (4 mg Intravenous Given 05/16/23 2012)  iohexol (OMNIPAQUE) 300 MG/ML solution 100 mL (100 mLs Intravenous Contrast Given 05/16/23 2043)    ED Course/ Medical Decision Making/ A&P                             Medical  Decision Making Amount and/or Complexity of Data Reviewed Labs: ordered. Radiology: ordered.  Risk Prescription drug management.   This patient is a 67 y.o. female who presents to the ED for concern of abdominal pain, nausea, vomiting, this involves an extensive number of treatment options, and is a complaint that carries with it a high risk of complications and morbidity. The emergent differential diagnosis prior to evaluation includes, but is not limited to, AAA, gastroenteritis, appendicitis, Bowel obstruction, Bowel perforation. Gastroparesis,  DKA, Hernia, Inflammatory bowel disease, mesenteric ischemia, pancreatitis, peritonitis SBP, volvulus.  This is not an exhaustive differential.   Past Medical History / Co-morbidities / Social History: history of G6PD, GERD, hypertension, asthma  Physical Exam: Physical exam performed. The pertinent findings include: RLQ TTP  Lab Tests: I ordered, and personally interpreted labs.  The pertinent results include: Ketonuria, proteinuria.  No other acute laboratory abnormalities.   Imaging Studies: I ordered imaging studies including CT abdomen and pelvis. I independently visualized and interpreted imaging which showed   1. Negative for acute appendicitis. No CT evidence for acute intra-abdominal or pelvic abnormality. 2. 1.5 cm hyperenhancing mass in the posterior right hepatic lobe, possible hemangioma. When the patient is clinically stable and able to follow directions and hold their breath (preferably as an outpatient) further evaluation with dedicated abdominal MRI should be considered.  I agree with the radiologist interpretation.   Cardiac Monitoring:  The patient was maintained on a cardiac monitor.  My attending physician Dr. Rush Landmark viewed and interpreted the cardiac monitored which showed an underlying rhythm of: no STEMI. I agree with this interpretation.   Medications: I ordered medication including zofran, fluids  for pain,  nausea. Reevaluation of the patient after these medicines showed that the patient improved. I have reviewed the patients home medicines and have made adjustments as needed.   Disposition: After consideration of the diagnostic results and the patients response to treatment, I feel that emergency department workup does not suggest an emergent condition requiring admission or immediate intervention beyond what has been performed at this time. The plan is: Discharge with prescription for Zofran and close outpatient follow-up. Patient is nontoxic, nonseptic appearing, in no apparent distress.  Patient's pain and other symptoms adequately managed in emergency department.  Fluid bolus given.  Labs, imaging and vitals reviewed.  Patient does not meet the SIRS or Sepsis criteria.  On repeat exam patient does not have a surgical abdomin and there are no peritoneal signs.  No indication of appendicitis, bowel obstruction, bowel perforation, cholecystitis, diverticulitis.  Upon reassessment after antiemetics and fluids, patient is able to drink without any residual episodes of nausea or vomiting.  She feels ready to go home.  Will send prescription for Zofran.  I have also informed her of the incidental findings on her CT and recommendations for a outpatient abdominal MRI.  I have informed her that she can set this up through her primary care doctor.  Evaluation and diagnostic testing in the emergency department does not suggest an emergent condition requiring admission or immediate intervention beyond what has been performed at this time.  Plan for discharge with close PCP follow-up.  Patient is understanding and amenable with plan, educated on red flag symptoms that would prompt immediate return.  Patient discharged in stable condition.  Final Clinical Impression(s) / ED Diagnoses Final diagnoses:  Generalized abdominal pain  Liver mass, right lobe    Rx / DC Orders ED Discharge Orders          Ordered     ondansetron (ZOFRAN-ODT) 4 MG disintegrating tablet  Every 8 hours PRN        05/16/23 2309          An After Visit Summary was printed and given to the patient.     Vear Clock 05/16/23 2311    Tegeler, Canary Brim, MD 05/16/23 [phone]

## 2023-05-16 NOTE — ED Notes (Signed)
RN reviewed discharge instructions with pt. Pt verbalized understanding and had no further questions. VSS upon discharge.  

## 2023-05-16 NOTE — ED Notes (Signed)
Pt tolerating fluids at this time.  

## 2023-05-16 NOTE — ED Triage Notes (Addendum)
+  N/+V x3 days. Took zofran once yesterday did not seem to help. No diarrhea. Last BM Friday morning. Afebrile. RLQ pain. Finish paxlovid on Thursday fo recent COVID infection. Seen at [facility] clinic today- deferred here.

## 2023-05-16 NOTE — Discharge Instructions (Signed)
As discussed, your workup in the ER today was reassuring for acute findings.  Laboratory evaluation and CT imaging did not reveal any emergent causes of your symptoms.  I have given you a prescription for Zofran to take for any residual nausea and recommend that you schedule an appointment with your primary care doctor in the next few days for follow-up.  Additionally, as we discussed there was an incidental finding of a spot on your liver that needs to have an MRI scheduled through your primary care doctor outpatient.  Please call them to schedule this.  Return if development of any new or worsening symptoms.

## 2023-05-25 ENCOUNTER — Other Ambulatory Visit: Payer: Self-pay | Admitting: Family Medicine

## 2023-05-25 DIAGNOSIS — I700 Atherosclerosis of aorta: Secondary | ICD-10-CM

## 2023-05-25 DIAGNOSIS — R160 Hepatomegaly, not elsewhere classified: Secondary | ICD-10-CM

## 2023-06-16 ENCOUNTER — Ambulatory Visit
Admission: RE | Admit: 2023-06-16 | Discharge: 2023-06-16 | Disposition: A | Discharge status: Home / Self Care | Payer: Federal, State, Local not specified - PPO | Source: Ambulatory Visit | Attending: Family Medicine | Admitting: Family Medicine

## 2023-06-16 DIAGNOSIS — R160 Hepatomegaly, not elsewhere classified: Secondary | ICD-10-CM

## 2023-06-17 ENCOUNTER — Other Ambulatory Visit: Payer: Self-pay | Admitting: Family Medicine

## 2023-06-17 DIAGNOSIS — R160 Hepatomegaly, not elsewhere classified: Secondary | ICD-10-CM

## 2023-06-23 IMAGING — MR MR LUMBAR SPINE W/O CM
4 of 6 series · 25 of 48 positions shown · non-contrast
Comparison: Lumbar spine CT 05/03/2021

CLINICAL DATA: Fall in [id number] with mid and low back pain. Left-sided
weakness.

EXAM:
MRI THORACIC AND LUMBAR SPINE WITHOUT CONTRAST
TECHNIQUE: Multiplanar and multiecho pulse sequences of the thoracic and lumbar
spine were obtained without intravenous contrast.

[Series 3: T2 · sagittal · 4.0mm · 0.53mm/px · 6 of 18 slices shown (1 of 2)]
[im 1/18]
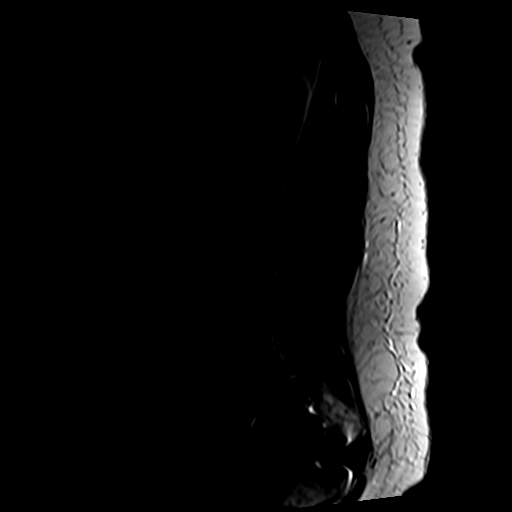
[im 4/18]
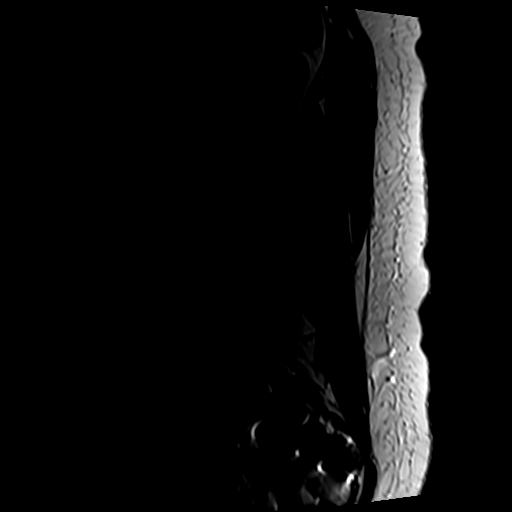
[im 7/18]
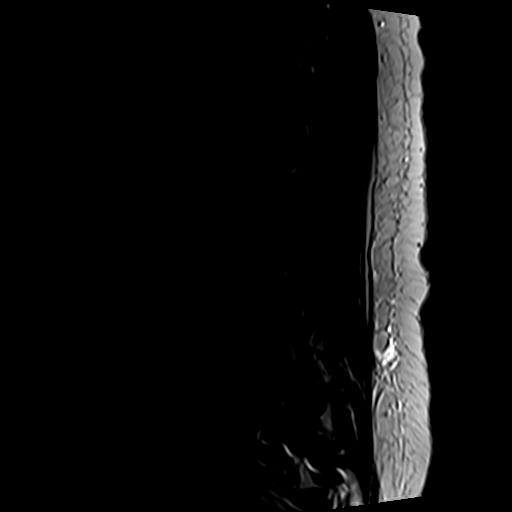
[im 11/18]
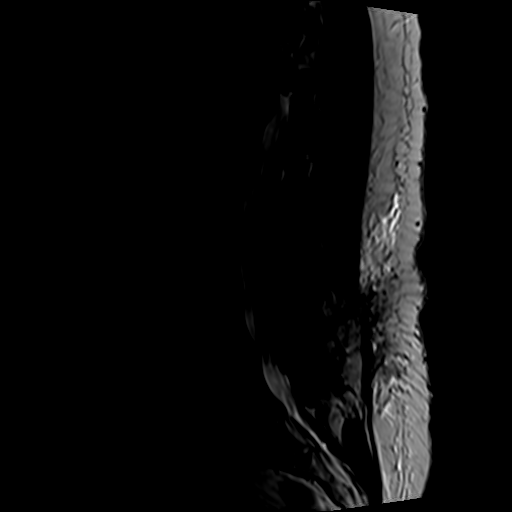
[im 14/18]
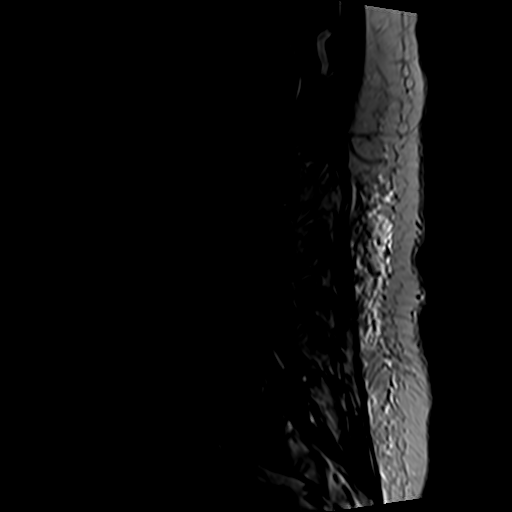
[im 18/18]
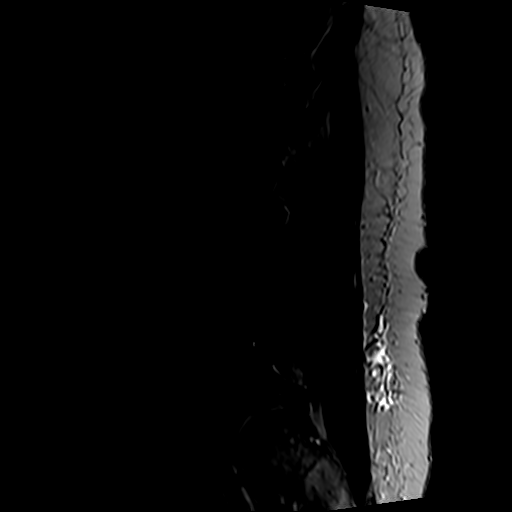

[Series 5: T1 · sagittal · 4.0mm · 0.53mm/px · 5 of 15 slices shown (1 of 2)]
[im 1/15]
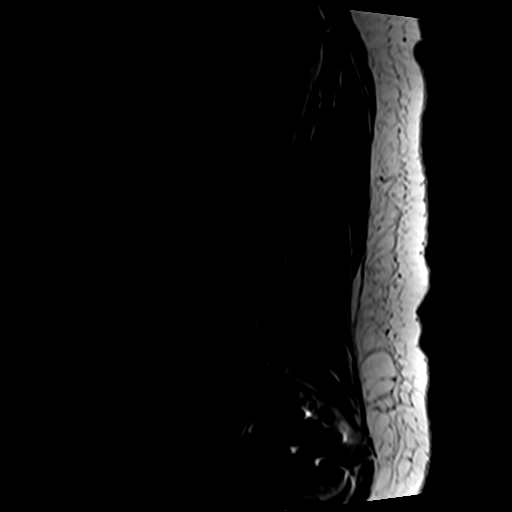
[im 4/15]
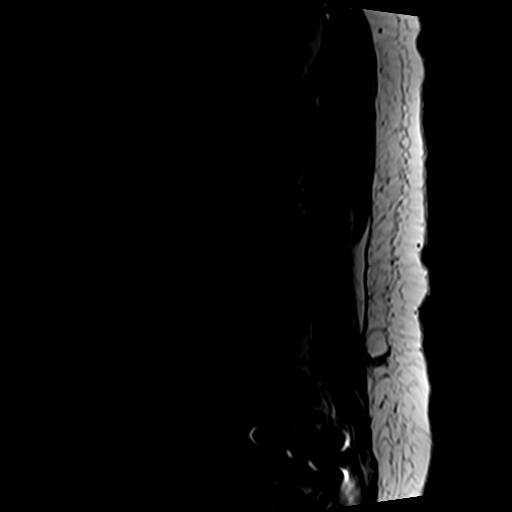
[im 8/15]
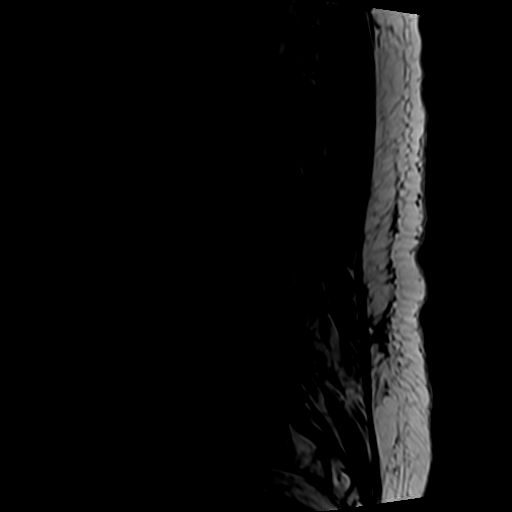
[im 11/15]
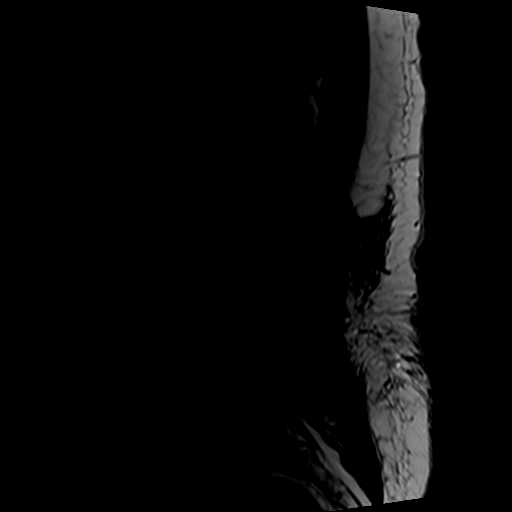
[im 15/15]
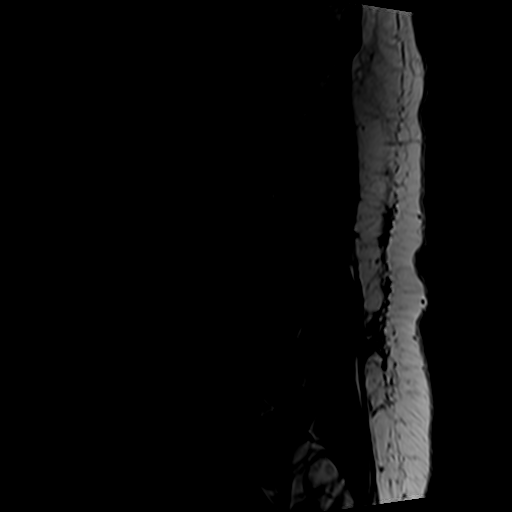

[Series 8: T2 · axial · 4.0mm · 0.70mm/px · z∈[-77,+154]mm · 9 of 42 slices shown (2 of 2)]
[im 1/42]
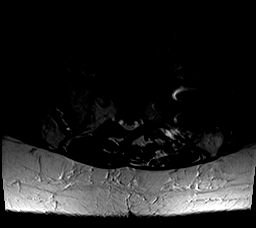
[im 7/42]
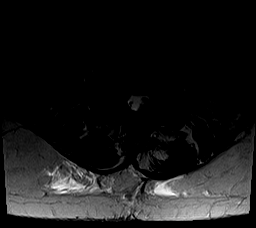
[im 14/42]
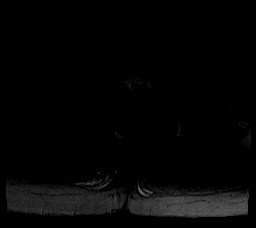
[im 18/42]
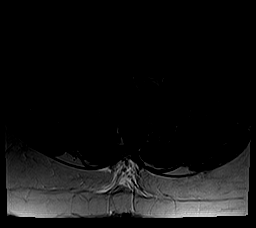
[im 21/42]
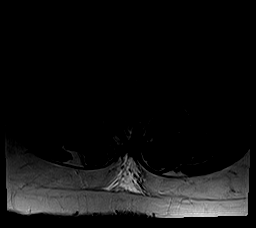
[im 24/42]
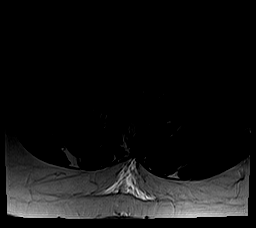
[im 28/42]
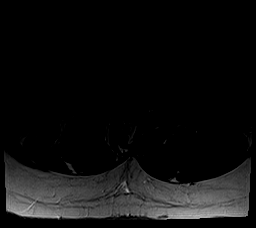
[im 35/42]
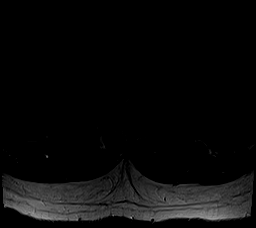
[im 42/42]
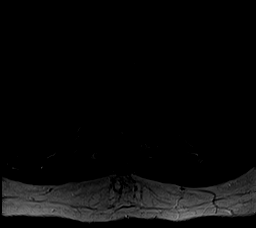

[Series 9: T1 · axial · 4.0mm · 0.35mm/px · z∈[-77,+118]mm · 5 of 42 slices shown (2 of 2)]
[im 1/42]
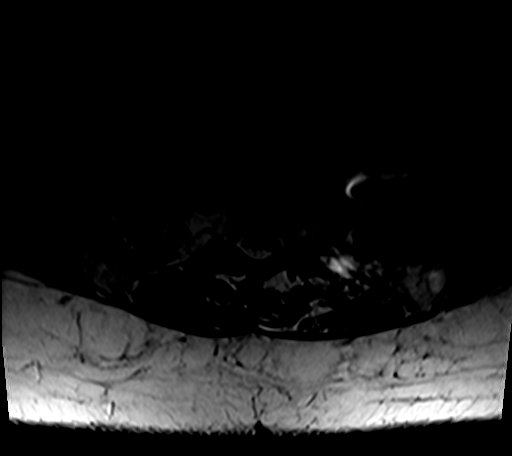
[im 7/42]
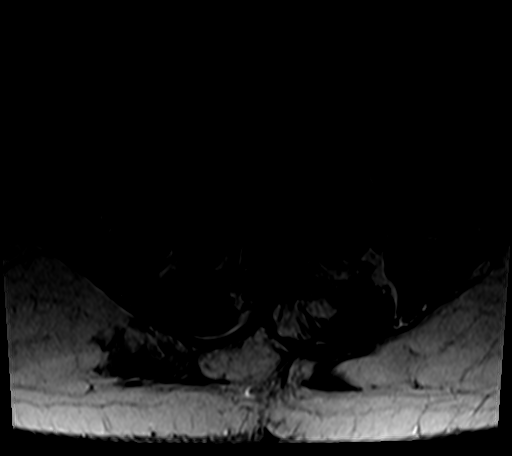
[im 14/42]
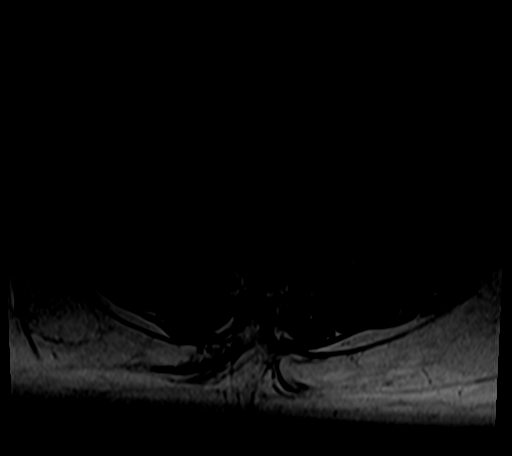
[im 21/42]
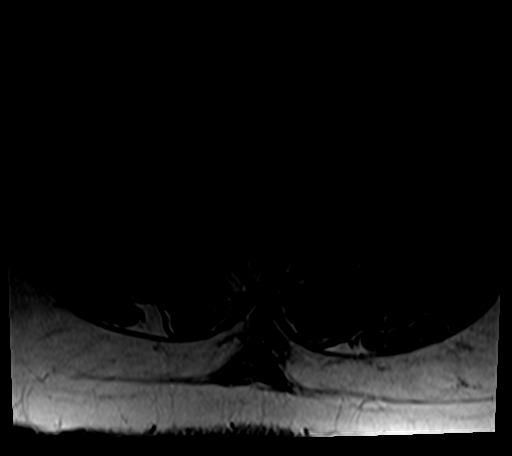
[im 35/42]
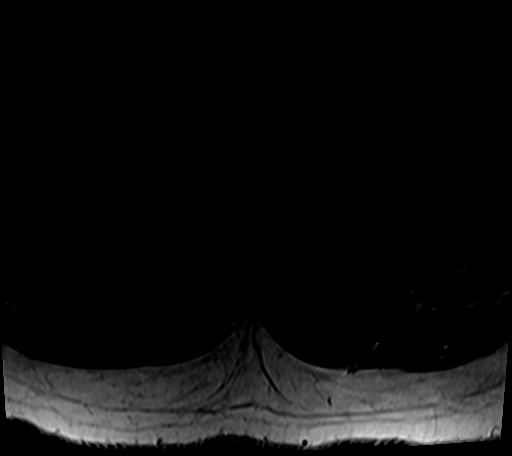

[25 of 48 positions shown; findings below may reference images not displayed]

FINDINGS: MRI THORACIC SPINE FINDINGS

Alignment: Mild scoliosis. Negative for listhesis. Exaggerated
thoracic kyphosis.

Vertebrae: No fracture, evidence of discitis, or bone lesion.

Cord:  Normal signal and morphology.

Paraspinal and other soft tissues: Negative.

Disc levels:

Degenerative facet spurring asymmetric to the left encroaching on
the foramen at T9-10 and especially T10-11. Mild lower lumbar disc
bulging. Widely patent thecal sac.

MRI LUMBAR SPINE FINDINGS

Segmentation:  Standard based on prior numbering.

Alignment:  Levoscoliosis.

Vertebrae:  No fracture, evidence of discitis, or bone lesion.

Conus medullaris and cauda equina: Conus extends to the T12-L1
level. Conus and cauda equina appear normal.

Paraspinal and other soft tissues: No evidence of perispinal mass or
inflammation. Partially covered left sacroiliac joint fixation
hardware.

Disc levels:

T12- L1: Mild right facet spurring

L1-L2: Mild right facet spurring.

L2-L3: Asymmetric right facet spurring

L3-L4: Asymmetric right facet spurring

L4-L5: Greatest level of facet degeneration with fluid-filled gaping
joints and prominent bony/ligamentous hypertrophy. Left foraminal
protrusion and impingement. High-grade spinal stenosis. Right
foraminal narrowing is moderate

L5-S1:Disc narrowing and bulging with central protrusion.
Hypertrophic facets. Bilateral S1 impingement at the subarticular
recesses
IMPRESSION: MR THORACIC SPINE IMPRESSION

Mild scoliosis and facet degeneration with moderate left foraminal
narrowing at T10-11.

MR LUMBAR SPINE IMPRESSION

1. Scoliosis and primarily lower lumbar spine degeneration
especially affecting the L4-5 and L5-S1 facets.
2. L4-5 high-grade spinal and left foraminal stenosis.
3. L5-S1 bilateral subarticular recess impingement on S1.

## 2023-06-25 ENCOUNTER — Other Ambulatory Visit: Payer: Federal, State, Local not specified - PPO

## 2023-06-26 ENCOUNTER — Ambulatory Visit
Admission: RE | Admit: 2023-06-26 | Discharge: 2023-06-26 | Disposition: A | Discharge status: Home / Self Care | Payer: Federal, State, Local not specified - PPO | Source: Ambulatory Visit | Attending: Family Medicine | Admitting: Family Medicine

## 2023-06-26 ENCOUNTER — Ambulatory Visit: Admission: RE | Admit: 2023-06-26 | Payer: Federal, State, Local not specified - PPO | Source: Ambulatory Visit

## 2023-06-26 DIAGNOSIS — I700 Atherosclerosis of aorta: Secondary | ICD-10-CM

## 2023-06-26 DIAGNOSIS — R160 Hepatomegaly, not elsewhere classified: Secondary | ICD-10-CM

## 2023-07-07 ENCOUNTER — Encounter: Payer: Self-pay | Admitting: Family Medicine

## 2023-07-08 ENCOUNTER — Other Ambulatory Visit: Payer: Self-pay | Admitting: Family Medicine

## 2023-07-08 DIAGNOSIS — R160 Hepatomegaly, not elsewhere classified: Secondary | ICD-10-CM

## 2023-07-09 ENCOUNTER — Ambulatory Visit
Admission: RE | Admit: 2023-07-09 | Discharge: 2023-07-09 | Disposition: A | Discharge status: Home / Self Care | Payer: Federal, State, Local not specified - PPO | Source: Ambulatory Visit | Attending: Family Medicine | Admitting: Family Medicine

## 2023-07-09 ENCOUNTER — Other Ambulatory Visit: Payer: Self-pay | Admitting: Family Medicine

## 2023-07-09 ENCOUNTER — Inpatient Hospital Stay: Admission: RE | Admit: 2023-07-09 | Payer: Federal, State, Local not specified - PPO | Source: Ambulatory Visit

## 2023-07-09 DIAGNOSIS — Z1231 Encounter for screening mammogram for malignant neoplasm of breast: Secondary | ICD-10-CM

## 2023-07-09 DIAGNOSIS — R160 Hepatomegaly, not elsewhere classified: Secondary | ICD-10-CM

## 2023-07-09 MED ORDER — GADOPICLENOL 0.5 MMOL/ML IV SOLN
10.0000 mL | Freq: Once | INTRAVENOUS | Status: AC | PRN
  Administered 2023-07-09: 10 mL via INTRAVENOUS

## 2023-07-21 ENCOUNTER — Ambulatory Visit
Admission: RE | Admit: 2023-07-21 | Discharge: 2023-07-21 | Disposition: A | Discharge status: Home / Self Care | Payer: Federal, State, Local not specified - PPO | Source: Ambulatory Visit | Attending: Family Medicine | Admitting: Family Medicine

## 2023-07-21 DIAGNOSIS — Z1231 Encounter for screening mammogram for malignant neoplasm of breast: Secondary | ICD-10-CM

## 2024-01-20 ENCOUNTER — Other Ambulatory Visit: Payer: Self-pay | Admitting: Orthopedic Surgery

## 2024-03-02 ENCOUNTER — Telehealth (INDEPENDENT_AMBULATORY_CARE_PROVIDER_SITE_OTHER): Payer: Self-pay

## 2024-03-02 NOTE — Telephone Encounter (Signed)
 Patient qualify for weight loss program

## 2024-03-04 ENCOUNTER — Encounter (HOSPITAL_BASED_OUTPATIENT_CLINIC_OR_DEPARTMENT_OTHER): Payer: Self-pay | Admitting: Orthopedic Surgery

## 2024-03-04 ENCOUNTER — Other Ambulatory Visit: Payer: Self-pay

## 2024-03-07 ENCOUNTER — Telehealth: Payer: Self-pay | Admitting: Pharmacist

## 2024-03-07 NOTE — Telephone Encounter (Signed)
 Pharmacy Patient Advocate Encounter   Received notification from CoverMyMeds that prior authorization for Emgality  120MG /ML auto-injectors (migraine) is required/requested.   Insurance verification completed.   The patient is insured through [facility] .   Per test claim: PA required; PA submitted to above mentioned insurance via CoverMyMeds Key/confirmation #/EOC [id number] Status is pending

## 2024-03-07 NOTE — Telephone Encounter (Signed)
 Pharmacy Patient Advocate Encounter  Received notification from [facility] that Prior Authorization for Emgality  120MG /ML auto-injectors (migraine) has been APPROVED from 02/06/2024 to 03/07/2025   PA #/Case ID/Reference #: [id number]

## 2024-03-11 ENCOUNTER — Ambulatory Visit (HOSPITAL_BASED_OUTPATIENT_CLINIC_OR_DEPARTMENT_OTHER)
Admission: RE | Admit: 2024-03-11 | Discharge: 2024-03-11 | Disposition: A | Discharge status: Home / Self Care | Attending: Orthopedic Surgery | Admitting: Orthopedic Surgery

## 2024-03-11 ENCOUNTER — Ambulatory Visit (HOSPITAL_BASED_OUTPATIENT_CLINIC_OR_DEPARTMENT_OTHER): Admitting: Anesthesiology

## 2024-03-11 ENCOUNTER — Encounter (HOSPITAL_BASED_OUTPATIENT_CLINIC_OR_DEPARTMENT_OTHER): Payer: Self-pay | Admitting: Orthopedic Surgery

## 2024-03-11 ENCOUNTER — Other Ambulatory Visit: Payer: Self-pay

## 2024-03-11 ENCOUNTER — Encounter (HOSPITAL_BASED_OUTPATIENT_CLINIC_OR_DEPARTMENT_OTHER)
Admission: RE | Disposition: A | Discharge status: Home / Self Care | Payer: Self-pay | Source: Home / Self Care | Attending: Orthopedic Surgery

## 2024-03-11 DIAGNOSIS — J4489 Other specified chronic obstructive pulmonary disease: Secondary | ICD-10-CM | POA: Diagnosis not present

## 2024-03-11 DIAGNOSIS — M65332 Trigger finger, left middle finger: Secondary | ICD-10-CM | POA: Insufficient documentation

## 2024-03-11 DIAGNOSIS — K219 Gastro-esophageal reflux disease without esophagitis: Secondary | ICD-10-CM | POA: Insufficient documentation

## 2024-03-11 DIAGNOSIS — G4730 Sleep apnea, unspecified: Secondary | ICD-10-CM | POA: Insufficient documentation

## 2024-03-11 DIAGNOSIS — M797 Fibromyalgia: Secondary | ICD-10-CM | POA: Diagnosis not present

## 2024-03-11 DIAGNOSIS — I10 Essential (primary) hypertension: Secondary | ICD-10-CM | POA: Insufficient documentation

## 2024-03-11 DIAGNOSIS — Z7951 Long term (current) use of inhaled steroids: Secondary | ICD-10-CM | POA: Diagnosis not present

## 2024-03-11 DIAGNOSIS — R7303 Prediabetes: Secondary | ICD-10-CM | POA: Diagnosis not present

## 2024-03-11 DIAGNOSIS — Z87891 Personal history of nicotine dependence: Secondary | ICD-10-CM | POA: Diagnosis not present

## 2024-03-11 HISTORY — PX: TRIGGER FINGER RELEASE: SHX641

## 2024-03-11 LAB — BASIC METABOLIC PANEL WITH GFR
Anion gap: 12 (ref 5–15)
BUN: 12 mg/dL (ref 8–23)
CO2: 25 mmol/L (ref 22–32)
Calcium: 9.6 mg/dL (ref 8.9–10.3)
Chloride: 101 mmol/L (ref 98–111)
Creatinine, Ser: 0.74 mg/dL (ref 0.44–1.00)
GFR, Estimated: >60 mL/min (ref >60)
Glucose, Bld: 112 mg/dL — ABNORMAL HIGH (ref 70–99)
Potassium: 4.2 mmol/L (ref 3.5–5.1)
Sodium: 138 mmol/L (ref 135–145)

## 2024-03-11 SURGERY — RELEASE, A1 PULLEY, FOR TRIGGER FINGER
Anesthesia: Monitor Anesthesia Care | Site: Middle Finger | Laterality: Left

## 2024-03-11 MED ORDER — ACETAMINOPHEN 500 MG PO TABS
1000.0000 mg | ORAL_TABLET | Freq: Once | ORAL | Status: AC
  Administered 2024-03-11: 1000 mg via ORAL

## 2024-03-11 MED ORDER — ACETAMINOPHEN 500 MG PO TABS
ORAL_TABLET | ORAL | Status: AC
  Filled 2024-03-11: qty 2

## 2024-03-11 MED ORDER — FENTANYL CITRATE (PF) 100 MCG/2ML IJ SOLN
INTRAMUSCULAR | Status: DC | PRN
Start: 2024-03-11 — End: 2024-03-11
  Administered 2024-03-11: 50 ug via INTRAVENOUS

## 2024-03-11 MED ORDER — MIDAZOLAM HCL 2 MG/2ML IJ SOLN
INTRAMUSCULAR | Status: AC
Start: 2024-03-11 — End: ?
  Filled 2024-03-11: qty 2

## 2024-03-11 MED ORDER — LIDOCAINE 2% (20 MG/ML) 5 ML SYRINGE
INTRAMUSCULAR | Status: AC
  Filled 2024-03-11: qty 5

## 2024-03-11 MED ORDER — MIDAZOLAM HCL 2 MG/2ML IJ SOLN: 0.5000 mg | Freq: Once | INTRAMUSCULAR | Status: DC | PRN

## 2024-03-11 MED ORDER — CEFAZOLIN SODIUM-DEXTROSE 2-4 GM/100ML-% IV SOLN
INTRAVENOUS | Status: AC
Start: 2024-03-11 — End: ?
  Filled 2024-03-11: qty 100

## 2024-03-11 MED ORDER — LIDOCAINE HCL (PF) 1 % IJ SOLN
INTRAMUSCULAR | Status: AC
  Filled 2024-03-11: qty 30

## 2024-03-11 MED ORDER — LACTATED RINGERS IV SOLN: INTRAVENOUS | Status: DC

## 2024-03-11 MED ORDER — PROPOFOL 500 MG/50ML IV EMUL
INTRAVENOUS | Status: AC
  Filled 2024-03-11: qty 50

## 2024-03-11 MED ORDER — DIPHENHYDRAMINE HCL 50 MG/ML IJ SOLN
INTRAMUSCULAR | Status: AC
  Filled 2024-03-11: qty 1

## 2024-03-11 MED ORDER — MIDAZOLAM HCL 2 MG/2ML IJ SOLN
INTRAMUSCULAR | Status: DC | PRN
Start: 2024-03-11 — End: 2024-03-11
  Administered 2024-03-11: 2 mg via INTRAVENOUS

## 2024-03-11 MED ORDER — PROPOFOL 10 MG/ML IV BOLUS
INTRAVENOUS | Status: AC
  Filled 2024-03-11: qty 20

## 2024-03-11 MED ORDER — 0.9 % SODIUM CHLORIDE (POUR BTL) OPTIME
TOPICAL | Status: DC | PRN
  Administered 2024-03-11: 200 mL

## 2024-03-11 MED ORDER — GLYCOPYRROLATE PF 0.2 MG/ML IJ SOSY
PREFILLED_SYRINGE | INTRAMUSCULAR | Status: AC
  Filled 2024-03-11: qty 1

## 2024-03-11 MED ORDER — PROPOFOL 500 MG/50ML IV EMUL
INTRAVENOUS | Status: DC | PRN
  Administered 2024-03-11: 50 ug via INTRAVENOUS
  Administered 2024-03-11: 150 ug/kg/min via INTRAVENOUS

## 2024-03-11 MED ORDER — CEFAZOLIN SODIUM-DEXTROSE 2-4 GM/100ML-% IV SOLN
2.0000 g | INTRAVENOUS | Status: AC
  Administered 2024-03-11: 2 g via INTRAVENOUS

## 2024-03-11 MED ORDER — ATROPINE SULFATE 0.4 MG/ML IV SOLN
INTRAVENOUS | Status: AC
  Filled 2024-03-11: qty 1

## 2024-03-11 MED ORDER — FENTANYL CITRATE (PF) 100 MCG/2ML IJ SOLN: 25.0000 ug | INTRAMUSCULAR | Status: DC | PRN

## 2024-03-11 MED ORDER — BUPIVACAINE HCL (PF) 0.25 % IJ SOLN
INTRAMUSCULAR | Status: DC | PRN
  Administered 2024-03-11: 4.5 mL

## 2024-03-11 MED ORDER — FENTANYL CITRATE (PF) 100 MCG/2ML IJ SOLN
INTRAMUSCULAR | Status: AC
  Filled 2024-03-11: qty 2

## 2024-03-11 MED ORDER — SODIUM CHLORIDE (PF) 0.9 % IJ SOLN
INTRAMUSCULAR | Status: AC
  Filled 2024-03-11: qty 20

## 2024-03-11 MED ORDER — BUPIVACAINE HCL (PF) 0.25 % IJ SOLN
INTRAMUSCULAR | Status: AC
  Filled 2024-03-11: qty 30

## 2024-03-11 SURGICAL SUPPLY — 27 items
BLADE SURG 15 STRL LF DISP TIS (BLADE) ×4 IMPLANT
BNDG COHESIVE 2X5 TAN ST LF (GAUZE/BANDAGES/DRESSINGS) ×2 IMPLANT
BNDG ESMARK 4X9 LF (GAUZE/BANDAGES/DRESSINGS) IMPLANT
CHLORAPREP W/TINT 26 (MISCELLANEOUS) ×2 IMPLANT
CORD BIPOLAR FORCEPS 12FT (ELECTRODE) ×2 IMPLANT
COVER BACK TABLE 60X90IN (DRAPES) ×2 IMPLANT
COVER MAYO STAND STRL (DRAPES) ×2 IMPLANT
CUFF TOURN SGL QUICK 18X4 (TOURNIQUET CUFF) ×2 IMPLANT
DRAPE EXTREMITY T 121X128X90 (DISPOSABLE) ×2 IMPLANT
DRAPE SURG 17X23 STRL (DRAPES) ×2 IMPLANT
GAUZE SPONGE 4X4 12PLY STRL (GAUZE/BANDAGES/DRESSINGS) ×2 IMPLANT
GAUZE XEROFORM 1X8 LF (GAUZE/BANDAGES/DRESSINGS) ×2 IMPLANT
GLOVE BIO SURGEON STRL SZ7.5 (GLOVE) ×2 IMPLANT
GLOVE BIOGEL PI IND STRL 8 (GLOVE) ×2 IMPLANT
GLOVE SURG SS PI 7.5 STRL IVOR (GLOVE) IMPLANT
GOWN STRL REUS W/ TWL LRG LVL3 (GOWN DISPOSABLE) ×2 IMPLANT
GOWN STRL REUS W/TWL XL LVL3 (GOWN DISPOSABLE) ×2 IMPLANT
NDL HYPO 25X1 1.5 SAFETY (NEEDLE) ×2 IMPLANT
NEEDLE HYPO 25X1 1.5 SAFETY (NEEDLE) ×1 IMPLANT
NS IRRIG 1000ML POUR BTL (IV SOLUTION) ×2 IMPLANT
PACK BASIN DAY SURGERY FS (CUSTOM PROCEDURE TRAY) ×2 IMPLANT
STOCKINETTE 4X48 STRL (DRAPES) ×2 IMPLANT
SUT ETHILON 4 0 PS 2 18 (SUTURE) ×2 IMPLANT
SYR BULB EAR ULCER 3OZ GRN STR (SYRINGE) ×2 IMPLANT
SYR CONTROL 10ML LL (SYRINGE) ×2 IMPLANT
TOWEL GREEN STERILE FF (TOWEL DISPOSABLE) ×4 IMPLANT
UNDERPAD 30X36 HEAVY ABSORB (UNDERPADS AND DIAPERS) ×2 IMPLANT

## 2024-03-11 NOTE — H&P (Signed)
 Ashley [name] is an 68 y.o. female.   Chief Complaint: trigger digit HPI: 68 yo female with left long finger trigger digit.  This has been injected without lasting resolution.  She wishes to proceed with surgical trigger release.  Allergies:  Allergies  Allergen Reactions   Latex Itching, Swelling and Other (See Comments)    redness   Sulfa Antibiotics Diarrhea and Nausea And Vomiting   Aspirin Other (See Comments)    Can cause her to bleed out due to G6 PD   Tetracyclines & Related Diarrhea and Nausea And Vomiting   Wound Dressing Adhesive     Bandaids and all tape!    Oxycodone  Itching    "all over"   Tramadol Other (See Comments)    Makes her feel " drugged"    Past Medical History:  Diagnosis Date   Anemia    Anxiety 2020   Arthritis    Asthma 2013-2014   Depression    Dyspnea 2013   Fibromyalgia 09/04/2014   GERD (gastroesophageal reflux disease)    Glucose-6-phosphate dehydrogenase deficiency    Headache    History of hiatal hernia    Hypertension    Lumbar radicular pain 09/04/2014   Pre-diabetes    Sacroiliac joint pain    nonunion of left SI joint   Sleep apnea 2016   Spinal headache    TIA (transient ischemic attack)    Wears glasses     Past Surgical History:  Procedure Laterality Date   ABDOMINAL HYSTERECTOMY     BACK SURGERY  2002   BREAST BIOPSY Right 06/21/2022   BREAST EXCISIONAL BIOPSY Left 1985   BREAST SURGERY     BUNIONECTOMY     COLONOSCOPY     DILATION AND CURETTAGE OF UTERUS     ESOPHAGEAL MANOMETRY [name] 11/21/2013   Procedure: ESOPHAGEAL MANOMETRY (EM);  Surgeon: Yvetta Herbert, MD;  Location: [facility];  Service: Endoscopy;  Laterality: [name];   FINGER ARTHROPLASTY Right 11/20/2022   Procedure: VOLAR PLATE RECONSTRUCTION/IMBRICATION;  Surgeon: Brunilda Capra, MD;  Location: [facility];  Service: Orthopedics;  Laterality: Right;   HAND TENODESIS Right 11/20/2022   Procedure: RIGHT LONG FINGER TENODESIS;   Surgeon: Brunilda Capra, MD;  Location: [facility];  Service: Orthopedics;  Laterality: Right;  60  MIN   KNEE ARTHROSCOPY W/ MENISCAL REPAIR     LUMBAR LAMINECTOMY     MULTIPLE TOOTH EXTRACTIONS     NASAL SINUS SURGERY     PAROTIDECTOMY Left 09/21/2020   Procedure: LEFT SUPERFICIAL PAROTIDECTOMY WITH NERVE MONITORING;  Surgeon: Ammon Bales, MD;  Location: [facility];  Service: ENT;  Laterality: Left;   SACROILIAC JOINT FUSION Left 08/05/2018   Procedure: REVISION LEFT SACROILIAC JOINT FUSION WITH INSTRUMENTATION AND ALLOGRAFT;  Surgeon: Virl Grimes, MD;  Location: [facility] OR;  Service: Orthopedics;  Laterality: Left;   SPINAL CORD STIMULATOR IMPLANT     TONSILLECTOMY      Family History: Family History  Problem Relation Age of Onset   Migraines Mother    Aneurysm Father    Breast cancer Sister    Eczema Sister    Urticaria Sister    Migraines Daughter    Urticaria Daughter    Asthma Daughter    Bipolar disorder Brother    Diabetes Brother    Cancer Other    Hypertension Other    Diabetes Other    Allergic rhinitis Neg Hx    Angioedema Neg Hx    Immunodeficiency Neg  Hx     Social History:   reports that she quit smoking about 31 years ago. Her smoking use included cigarettes. She started smoking about 55 years ago. She has a 18 pack-year smoking history. She has never used smokeless tobacco. She reports current alcohol  use of about 1.0 standard drink of alcohol  per week. She reports that she does not use drugs.  Medications: Medications Prior to Admission  Medication Sig Dispense Refill   albuterol  (VENTOLIN  HFA) 108 (90 Base) MCG/ACT inhaler Inhale 1-2 puffs into the lungs every 6 (six) hours as needed for wheezing or shortness of breath. 16 g 6   atorvastatin  (LIPITOR) 20 MG tablet Take 20 mg by mouth daily.      azelastine  (OPTIVAR ) 0.05 % ophthalmic solution Place 2 drops into both eyes 2 (two) times daily. 6 mL 4   baclofen  (LIORESAL ) 20 MG tablet Take 1  tablet (20 mg total) by mouth 3 (three) times daily. 15 each 0   BELBUCA 150 MCG FILM Take 1 strip by mouth 2 (two) times daily.     budesonide -formoterol  (SYMBICORT ) 80-4.5 MCG/ACT inhaler Inhale 2 puffs into the lungs 2 (two) times daily. 1 each 5   Cholecalciferol (VITAMIN D-3) 125 MCG (5000 UT) TABS Take by mouth.     DULoxetine  (CYMBALTA ) 30 MG capsule Take 30 mg by mouth daily.     DULoxetine  (CYMBALTA ) 60 MG capsule Take 60 mg by mouth daily.      fluticasone  (FLONASE ) 50 MCG/ACT nasal spray Place 2 sprays into both nostrils 2 (two) times daily. 48 g 3   gabapentin  (NEURONTIN ) 100 MG capsule TAKE 1 CAPSULE BY MOUTH 3 TIMES DAILY. TAKE IN ADDITION TO 800 MG TABS FOR TOTAL OF 900 MG 90 capsule 0   gabapentin  (NEURONTIN ) 800 MG tablet Take 1 tablet (800 mg total) by mouth 3 (three) times daily. 90 tablet 5   hydrochlorothiazide  (HYDRODIURIL ) 12.5 MG tablet Take 12.5 mg by mouth 2 (two) times daily with breakfast and lunch.   1   losartan  (COZAAR ) 25 MG tablet Take 25 mg by mouth 2 (two) times daily with breakfast and lunch.   1   MAGNESIUM  CITRATE PO Take by mouth.     montelukast  (SINGULAIR ) 10 MG tablet Take 1 tablet (10 mg total) by mouth at bedtime. 90 tablet 0   Multiple Vitamin (MULTIVITAMIN WITH MINERALS) TABS tablet Take 1 tablet by mouth daily at 3 pm. Centrum for Women     naproxen  (EC NAPROSYN ) 500 MG EC tablet Take 500 mg by mouth 2 (two) times daily with a meal.      nortriptyline  (PAMELOR ) 25 MG capsule Take 1 capsule (25 mg total) by mouth at bedtime. 30 capsule 0   Polyethyl Glycol-Propyl Glycol (SYSTANE) 0.4-0.3 % SOLN Place 1 drop into both eyes every 6 (six) hours as needed (dry eyes). 30 mL 3   pregabalin  (LYRICA ) 25 MG capsule Take 25 mg by mouth 2 (two) times daily.     Rimegepant Sulfate (NURTEC) 75 MG TBDP DISSOLVE 1 TABLET IN MOUTH AS NEEDED 8 tablet 11   diphenhydrAMINE  (BENADRYL ) 25 MG tablet Take 25 mg by mouth at bedtime as needed. Take with oxycodone /acetaminophen   to prevent itching.     Galcanezumab -gnlm (EMGALITY ) 120 MG/ML SOAJ Inject 1 pen  into the skin every 30 (thirty) days. 1.12 mL 11   ondansetron  (ZOFRAN ) 4 MG tablet Take 1 tablet (4 mg total) by mouth every 8 (eight) hours as needed for nausea or vomiting. 20  tablet 1   ondansetron  (ZOFRAN -ODT) 4 MG disintegrating tablet Take 1 tablet (4 mg total) by mouth every 8 (eight) hours as needed for nausea or vomiting. 20 tablet 0   pantoprazole  (PROTONIX ) 40 MG tablet Take 40 mg by mouth 2 (two) times daily.      promethazine  (PHENERGAN ) 12.5 MG tablet Take 12.5-25 mg by mouth every 6 (six) hours as needed for nausea or vomiting.     Semaglutide (OZEMPIC, 0.25 OR 0.5 MG/DOSE, [place]) Inject 2.5 mg into the skin every 7 (seven) days.      Results for orders placed or performed during the hospital encounter of 03/11/24 (from the past 48 hours)  Basic metabolic panel per protocol     Status: Abnormal   Collection Time: 03/11/24 11:19 AM  Result Value Ref Range   Sodium 138 135 - 145 mmol/L   Potassium 4.2 3.5 - 5.1 mmol/L   Chloride 101 98 - 111 mmol/L   CO2 25 22 - 32 mmol/L   Glucose, Bld 112 (H) 70 - 99 mg/dL    Comment: Glucose reference range applies only to samples taken after fasting for at least 8 hours.   BUN 12 8 - 23 mg/dL   Creatinine, Ser 9.14 0.44 - 1.00 mg/dL   Calcium  9.6 8.9 - 10.3 mg/dL   GFR, Estimated >78 >29 mL/min    Comment: (NOTE) Calculated using the CKD-EPI Creatinine Equation (2021)    Anion gap 12 5 - 15    Comment: Performed at [facility], [address] [place], [place] [address]    No results found.    Blood pressure 122/75, pulse 82, temperature (!) 97.2 F (36.2 C), temperature source Oral, resp. rate 18, height 5\' 9"  (1.753 m), weight 94.9 kg, SpO2 100%.  General appearance: alert, cooperative, and appears stated age Head: Normocephalic, without obvious abnormality, atraumatic Neck: supple, symmetrical, trachea midline Extremities: Intact  sensation and capillary refill all digits.  +epl/fpl/io.  No wounds.  Skin: Skin color, texture, turgor normal. No rashes or lesions Neurologic: Grossly normal Incision/Wound: none  Assessment/Plan Left long finger trigger release.  Non operative and operative treatment options have been discussed with the patient and patient wishes to proceed with operative treatment. Risks, benefits, and alternatives of surgery have been discussed and the patient agrees with the plan of care.   Brigham Cobbins 03/11/2024, 1:09 PM

## 2024-03-11 NOTE — Anesthesia Postprocedure Evaluation (Signed)
 Anesthesia Post Note  Patient: Kailah Marron Jones-Robinson  Procedure(s) Performed: RELEASE, A1 PULLEY, FOR TRIGGER FINGER (Left: Middle Finger)     Patient location during evaluation: Phase II Anesthesia Type: MAC Level of consciousness: awake and alert, patient cooperative and oriented Pain management: pain level controlled Vital Signs Assessment: post-procedure vital signs reviewed and stable Respiratory status: nonlabored ventilation, spontaneous breathing and respiratory function stable Cardiovascular status: blood pressure returned to baseline and stable Postop Assessment: no apparent nausea or vomiting, able to ambulate and adequate PO intake Anesthetic complications: no   No notable events documented.  Last Vitals:  Vitals:   03/11/24 1400 03/11/24 1415  BP: 123/70 133/70  Pulse: 78 83  Resp: 13 18  Temp: 36.5 C   SpO2: 100% 94%    Last Pain:  Vitals:   03/11/24 1415  TempSrc:   PainSc: 0-No pain                 Briell Paulette,E. Venissa Nappi

## 2024-03-11 NOTE — Discharge Instructions (Addendum)
 Hand Center Instructions Hand Surgery  Wound Care: Keep your hand elevated above the level of your heart.  Do not allow it to dangle by your side.  Keep the dressing dry and do not remove it unless your doctor advises you to do so.  He will usually change it at the time of your post-op visit.  Moving your fingers is advised to stimulate circulation but will depend on the site of your surgery.  If you have a splint applied, your doctor will advise you regarding movement.  Activity: Do not drive or operate machinery today.  Rest today and then you may return to your normal activity and work as indicated by your physician.  Diet:  Drink liquids today or eat a light diet.  You may resume a regular diet tomorrow.    General expectations: Pain for two to three days. Fingers may become slightly swollen.  Call your doctor if any of the following occur: Severe pain not relieved by pain medication. Elevated temperature. Dressing soaked with blood. Inability to move fingers. White or bluish color to fingers.  Post Anesthesia Home Care Instructions  Activity: Get plenty of rest for the remainder of the day. A responsible individual must stay with you for 24 hours following the procedure.  For the next 24 hours, DO NOT: -Drive a car -Advertising copywriter -Drink alcoholic beverages -Take any medication unless instructed by your physician -Make any legal decisions or sign important papers.  Meals: Start with liquid foods such as gelatin or soup. Progress to regular foods as tolerated. Avoid greasy, spicy, heavy foods. If nausea and/or vomiting occur, drink only clear liquids until the nausea and/or vomiting subsides. Call your physician if vomiting continues.  Special Instructions/Symptoms: Your throat may feel dry or sore from the anesthesia or the breathing tube placed in your throat during surgery. If this causes discomfort, gargle with warm salt water. The discomfort should disappear within 24  hours.  If you had a scopolamine patch placed behind your ear for the management of post- operative nausea and/or vomiting:  1. The medication in the patch is effective for 72 hours, after which it should be removed.  Wrap patch in a tissue and discard in the trash. Wash hands thoroughly with soap and water. 2. You may remove the patch earlier than 72 hours if you experience unpleasant side effects which may include dry mouth, dizziness or visual disturbances. 3. Avoid touching the patch. Wash your hands with soap and water after contact with the patch.      Next dose of tylenol  may be taken at 5p

## 2024-03-11 NOTE — Transfer of Care (Signed)
 Immediate Anesthesia Transfer of Care Note  Patient: Ashley [name]  Procedure(s) Performed: RELEASE, A1 PULLEY, FOR TRIGGER FINGER (Left: Middle Finger)  Patient Location: PACU  Anesthesia Type:MAC  Level of Consciousness: drowsy  Airway & Oxygen  Therapy: Patient Spontanous Breathing and Patient connected to face mask oxygen   Post-op Assessment: Report given to RN and Post -op Vital signs reviewed and stable  Post vital signs: Reviewed and stable  Last Vitals:  Vitals Value Taken Time  BP 104/51 03/11/24 1345  Temp    Pulse 79 03/11/24 1347  Resp 8 03/11/24 1347  SpO2 100 % 03/11/24 1347  Vitals shown include unfiled device data.  Last Pain:  Vitals:   03/11/24 1128  TempSrc: Oral  PainSc: 6       Patients Stated Pain Goal: 10 (03/11/24 1128)  Complications: No notable events documented.

## 2024-03-11 NOTE — Anesthesia Preprocedure Evaluation (Addendum)
 Anesthesia Evaluation  Patient identified by MRN, date of birth, ID band Patient awake    Reviewed: Allergy & Precautions, NPO status , Patient's Chart, lab work & pertinent test results  History of Anesthesia Complications Negative for: history of anesthetic complications  Airway Mallampati: I  TM Distance: >3 FB Neck ROM: Full    Dental  (+) Dental Advisory Given   Pulmonary asthma , sleep apnea and Continuous Positive Airway Pressure Ventilation , COPD,  COPD inhaler, former smoker   breath sounds clear to auscultation       Cardiovascular hypertension, Pt. on medications and Pt. on home beta blockers (-) angina  Rhythm:Regular Rate:Normal     Neuro/Psych  Headaches  Anxiety Depression    Spinal cord implant TIA   GI/Hepatic Neg liver ROS, hiatal hernia,GERD  Medicated and Controlled,,  Endo/Other  Ozempic: none in a month  Renal/GU negative Renal ROS     Musculoskeletal  (+) Arthritis ,    Abdominal   Peds  Hematology negative hematology ROS (+)   Anesthesia Other Findings   Reproductive/Obstetrics                             Anesthesia Physical Anesthesia Plan  ASA: 3  Anesthesia Plan: MAC   Post-op Pain Management: Tylenol  PO (pre-op)* and Minimal or no pain anticipated   Induction:   PONV Risk Score and Plan: 2 and Treatment may vary due to age or medical condition and Ondansetron   Airway Management Planned: Natural Airway and Simple Face Mask  Additional Equipment: None  Intra-op Plan:   Post-operative Plan:   Informed Consent: I have reviewed the patients History and Physical, chart, labs and discussed the procedure including the risks, benefits and alternatives for the proposed anesthesia with the patient or authorized representative who has indicated his/her understanding and acceptance.     Dental advisory given  Plan Discussed with: CRNA and  Surgeon  Anesthesia Plan Comments:         Anesthesia Quick Evaluation

## 2024-03-11 NOTE — Op Note (Signed)
 03/11/2024 [facility]  Operative Note  PREOPERATIVE DIAGNOSIS: LEFT LONG FINGER TRIGGER DIGIT  POSTOPERATIVE DIAGNOSIS:  LEFT LONG FINGER TRIGGER DIGIT  PROCEDURE: Procedure(s): RELEASE, A1 PULLEY, FOR TRIGGER FINGER   SURGEON:  Brunilda Capra, MD  ASSISTANT:  none.  ANESTHESIA:  Local with sedation.  IV FLUIDS:  Per anesthesia flow sheet.  ESTIMATED BLOOD LOSS:  Minimal.  COMPLICATIONS:  None.  SPECIMENS:  None.  TOURNIQUET TIME:  Total Tourniquet Time Documented: Forearm (Left) - 10 minutes Total: Forearm (Left) - 10 minutes   DISPOSITION:  Stable to PACU.  LOCATION: [facility]  INDICATIONS: Ashley [name] is a 68 y.o. female with triggering of the long finger.  This has been injected without lasting resolution.  She wishes to proceed with surgical trigger release.  Risks, benefits and alternatives of surgery were discussed including the risk of blood loss, infection, damage to nerves, vessels, tendons, ligaments, bone, failure of surgery, need for additional surgery, complications with wound healing, continued pain, continued triggering and need for repeat surgery.  She voiced understanding of these risks and elected to proceed.  OPERATIVE COURSE:  After being identified preoperatively by myself, the patient and I agreed upon the procedure and site of procedure.  The surgical site was marked. Surgical consent had been signed. She was given IV Ancef  as preoperative antibiotic prophylaxis. She was transported to the operating room and placed on the operating room table in supine position with the Left upper extremity on an arm board. Sedation was induced by the anesthesiologist.  A surgical pause was performed between surgeons, anesthesia, and operating room staff, and all were in agreement as to the patient, procedure, and site of procedure.  The surgical site was injected with half and half solution 1% plain lidocaine  and 0.25% plain  marcaine .  The Left upper extremity was prepped and draped in normal sterile orthopedic fashion. A surgical pause was performed between surgeons, anesthesia, and operating room staff, and all were in agreement as to the patient, procedure, and site of procedure.  Tourniquet at the proximal aspect of the forearm was inflated to 250 mmHg after exsanguination of the arm with an Esmarch bandage.  An incision was made at the volar aspect of the MP joint of the long finger.  This was carried into the subcutaneous tissues by spreading technique.  Bipolar electrocautery was used to obtain hemostasis.  The radial and ulnar digital nerves were protected throughout the case. The flexor sheath was identified.  The A1 pulley was identified and sharply incised.  It was released in its entirety.  The proximal 1-2 mm of the A2 pulley was vented to allow better excursion of the tendons.  The finger was placed through a range of motion and there was noted to be no catching.  The tendons were brought through the wound and any adherences released.  The wound was then copiously irrigated with sterile saline. It was closed with 4-0 nylon in a horizontal mattress fashion.  It was injected with 0.25% plain Marcaine  to aid in postoperative analgesia.  It was dressed with sterile Xeroform, 4x4s, and wrapped lightly with a Coban dressing.  Tourniquet was deflated at 10 minutes.  The fingertips were pink with brisk capillary refill after deflation of the tourniquet.  The operative drapes were broken down and the patient was awoken from anesthesia safely.  She was transferred back to the stretcher and taken to the PACU in stable condition.   I will see her back  in the office in 1 week for postoperative followup.  She states she has pain medication at home that she can use for post operative pain.    Adaijah Endres, MD Electronically signed, 03/11/24

## 2024-03-12 ENCOUNTER — Encounter (HOSPITAL_BASED_OUTPATIENT_CLINIC_OR_DEPARTMENT_OTHER): Payer: Self-pay | Admitting: Orthopedic Surgery

## 2024-03-31 ENCOUNTER — Other Ambulatory Visit: Payer: Self-pay | Admitting: Pain Medicine

## 2024-03-31 DIAGNOSIS — M5416 Radiculopathy, lumbar region: Secondary | ICD-10-CM

## 2024-04-07 ENCOUNTER — Ambulatory Visit
Admission: RE | Admit: 2024-04-07 | Discharge: 2024-04-07 | Disposition: A | Discharge status: Home / Self Care | Source: Ambulatory Visit | Attending: Pain Medicine | Admitting: Pain Medicine

## 2024-04-07 DIAGNOSIS — M5416 Radiculopathy, lumbar region: Secondary | ICD-10-CM

## 2024-04-11 ENCOUNTER — Ambulatory Visit
Admission: RE | Admit: 2024-04-11 | Discharge: 2024-04-11 | Disposition: A | Discharge status: Home / Self Care | Source: Ambulatory Visit | Attending: Pain Medicine | Admitting: Pain Medicine

## 2024-05-03 ENCOUNTER — Other Ambulatory Visit (HOSPITAL_COMMUNITY): Payer: Self-pay

## 2024-05-09 ENCOUNTER — Institutional Professional Consult (permissible substitution) (INDEPENDENT_AMBULATORY_CARE_PROVIDER_SITE_OTHER): Admitting: Physician Assistant

## 2024-05-11 ENCOUNTER — Ambulatory Visit: Payer: Federal, State, Local not specified - PPO | Admitting: Neurology

## 2024-05-20 ENCOUNTER — Encounter: Payer: Self-pay | Admitting: Neurology

## 2024-05-20 ENCOUNTER — Ambulatory Visit: Admitting: Neurology

## 2024-05-20 VITALS — BP 110/67 | HR 83 | Ht 69.0 in | Wt 216.0 lb

## 2024-05-20 DIAGNOSIS — G43709 Chronic migraine without aura, not intractable, without status migrainosus: Secondary | ICD-10-CM | POA: Diagnosis not present

## 2024-05-20 MED ORDER — EMGALITY 120 MG/ML ~~LOC~~ SOAJ: 1.0000 | SUBCUTANEOUS | 11 refills | Status: AC

## 2024-05-20 MED ORDER — NURTEC 75 MG PO TBDP: ORAL_TABLET | ORAL | 11 refills | Status: AC

## 2024-05-20 NOTE — Patient Instructions (Signed)
 Great to see you today! Continue current medications for migraine  Review the application for patient assistance for Emgality  Follow in 1 year with me. Thanks!!
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
## 2024-05-23 NOTE — Progress Notes (Signed)
 Chief Complaint: back and leg pain   History of Present Illness: It was a pleasure to evaluate Ashley [name] in clinic today at the kind referral of Dr. Lawrnce and Veola, [place], [name] [address] [facility] [place],  [place] [address].   Ashley [name] is a 68 y.o. female with history of G6PD deficiency (hemolytic anemia), anxiety, depression, chronic fatigue syndrome, fibromyalgia, hypertension, migraines, prior SI joint fusion, prior spinal cord stimulator, presenting with bilateral low back pain left worse than right, radiating to bilateral buttocks and legs. On left side radiates down leg to great toe, on right side radiates to thigh and then right foot also hurts. Worst positions are sitting, standing and walking, all are painful per patient. Symptoms started on left when she fell at work in 2016, has worker's comp claim relative to this. Symptoms started on right side on 2022.   Spine surgical history: -lumbar laminectomy February 2002, revision for CSF leak/dural repair May 2002 -Left SI joint fusion at outside hospital [facility]) 2018, for [employer] claim regarding fall on job in 2016; [place] joint fusion did not help pain per patient -revision of SI joint fusion in 2021, revision did not help pain per patient -spinal cord stimulator at outside hospital [facility]) June 2024; per patient this does not help her leg symptoms at all and minimally helps her back symptoms -Had bilateral SI joint RFA on 03/10/24 by Dr. Claudene; patient states zero improvement.   SPINE SCORING PORTION OF QUESTIONNAIRES NDI SCORE-PERCENTAGE ODI SCORE-PERCENTAGE NECK PAIN ARM PAIN BACK PAIN LEG PAIN PROMIS Pain Interference T-Score (range: 10 - 90) --  12/23/2021       68 (moderate)*   01/20/2022       71 (severe)*   02/24/2022       67 (moderate)*   07/04/2022  50 (Severe Disability) 0 (None) 0 (None) 7 (Severe) 9 (Very Severe) 62 (moderate)*   04/30/2024 10 (Mild Disability)  72 (Crippled) 0 (None) 1 (None) 8 (Severe) 9 (Very Severe) 74 (severe)*     SPINE SCORING PORTION OF QUESTIONNAIRES PROMIS Physical Function T-Score [facility] MyChart PROMIS Global Health Scoring (Physical) [facility] MyChart PROMIS Global Health Scoring (Mental)  12/23/2021 33 (moderate dysfunction)*    01/20/2022 36 (moderate dysfunction)*    02/24/2022 36 (moderate dysfunction)*    07/04/2022 42 (mild dysfunction) 32.4 (Moderate Symptoms) 38.8 (Moderate Symptoms)  04/30/2024 30 (moderate dysfunction)* 26.7 (Severe Symptoms) 36.3 (Moderate Symptoms)     Past Medical History:  Diagnosis Date  . Allergy As teen   Environmental  . Anxiety 2020   Grief related  . Asthma, unspecified asthma severity, unspecified whether complicated, unspecified whether persistent (HHS-HCC) 90s   Controlled  . Bleeding disorder () Congenital   G6PD  . Depression 90s   Fibromyalgia related  . History of cancer 2021   Benign  . Hyperlipidemia Within past 5 years   Takes meds  . Hypertension 2013   Borderline  . Obesity 2021    Past Surgical History:  Procedure Laterality Date  . SPINE SURGERY  2002 was 1st   Have had 5  . TONSILLECTOMY  1967    Social History   Socioeconomic History  . Marital status: Married  Tobacco Use  . Smoking status: Former    Current packs/day: 0.00    Average packs/day: 0.5 packs/day for 25.2 years (12.6 ttl pk-yrs)    Types: Cigarettes    Start date: 11/04/1967    Quit date: 02/01/1993  Years since quitting: 31.3  . Smokeless tobacco: Never  Vaping Use  . Vaping status: Never Used  Substance and Sexual Activity  . Alcohol  use: Yes    Alcohol /week: 2.0 standard drinks of alcohol     Types: 1 Glasses of wine, 1 Standard drinks or equivalent per week    Comment: Social drinker; 1 gl red wine daily (TIA)  . Drug use: Not Currently    Comment: Stopped in 1984  . Sexual activity: Not Currently    Partners: Male    Birth control/protection: None    Comment: I am a widow with  no partners; too old!   Social Drivers of Health   Financial Resource Strain: Medium Risk (05/22/2024)   Overall Financial Resource Strain (CARDIA)   . Difficulty of Paying Living Expenses: Somewhat hard  Food Insecurity: No Food Insecurity (05/22/2024)   Hunger Vital Sign   . Worried About Programme researcher, broadcasting/film/video in the Last Year: Never true   . Ran Out of Food in the Last Year: Never true  Transportation Needs: No Transportation Needs (05/22/2024)   PRAPARE - Transportation   . Lack of Transportation (Medical): No   . Lack of Transportation (Non-Medical): No    family history includes Cancer in her maternal aunt, maternal aunt, and sister; Clotting disorder in her brother; Dementia in her mother; Depression in her brother and mother; Diabetes in her brother and maternal uncle; Glaucoma in her maternal uncle and paternal grandfather; High blood pressure (Hypertension) in her mother and sister; Hyperlipidemia (Elevated cholesterol) in her mother; Obesity in her brother, maternal uncle, paternal grandmother, sister, sister, and sister.      IMAGING per my own measurement and interpretation: Xrays:  No prior EOS xray, longcassette spine xrays7/1/25:   MRI 05/07/24 lumbar spine without contrast at outside hospital: L4-5 facet hypertrophy and central/lateral recess stenosis      Bone Health:  Bone Density: No results found for this or any previous visit.    Vitamin D: No results found for: VITD  Resulted Labs: No results found for: BMP, INR, PTT, CBC  Nutritional Status: There is no height or weight on file to calculate BMI. Wt Readings from Last 3 Encounters:  05/03/24 94.8 kg (209 lb)  10/14/23 90.9 kg (200 lb 6.4 oz)  04/10/23 94.3 kg (207 lb 14.3 oz)    No results found for: ALBUMIN, PREALBUMIN  Diabetes Screening: No components found for: A1C  Nicotine Usage:  No         Physical Exam  Vitals:   05/23/24 1034  BP: 133/82  Pulse: 80   SpO2: 99%  PainSc:   8  PainLoc: Back   ' Constitutional: Oriented to person, place, and time. Appears well-developed and well-nourished. Cooperative. No distress.   Neurological: alert and oriented to person, place, and time.  sensory deficit bilateral feet left>right Gait antalgic   Reflex Scores:       Tricep reflexes are 2+ on the right side and 2+ on the left side.      Bicep reflexes are 2+ on the right side and 2+ on the left side.      Brachioradialis reflexes are 2+ on the right side and 2+ on the left side.      Patellar reflexes are 2+ on the right side and 2+ on the left side.      Achilles reflexes are 2+ on the right side and 2+ on the left side.  STRENGTH LEFT RIGHT  Deltoid 5 5  Bicep 5 5  Wrist Extensor 5 5  Tricep 5 5  Finger flexion 5 5  Finger abduction 5 5  Grip 5 5    Hip Flexion   4 (pain)   5  Knee Extension 5 5  Ankle Dorsiflexion 5 5  Extensor Hallucis Longus 5 5  Plantar Flexion 5 5  Foot eversion 5 5  Foot inversion 5 5   No Lhermitte's, No Spurling's No Hoffmann's  No ankle clonus Able to tandem walk   Sacroiliac Joint Exam LEFT RIGHT  Fortin Finger Test + -  PSIS tenderness + +  ThighThrust + +  FABER + -  Pelvic Gapping - -  Pelvic Compression + -  Gaenslen's ++ -  Sacral Thrust + +  Hip Exam    Posterior impingement - -  Medial femoral impingement + +   5 of 7 positive SI   Skin: Skin is warm, dry and intact.  Psychiatric: Normal mood and affect. Speech is normal and behavior is normal.    ASSESSMENT: Ashley [name] is a 68 y.o. female with history of G6PD deficiency (hemolytic anemia), anxiety, depression, chronic fatigue syndrome, fibromyalgia, hypertension, migraines, prior SI joint fusion, prior spinal cord stimulator, scoliosis, lumbar spondylolisthesis, lumbar stenosis, bilateral sacroiliac joint pain, bilateral hip pain, low back pain  PLAN:  Discussed with patient that it is unclear how much  spine vs SI joint vs hip vs myofascial pain is contributing to overall picture. I will order a CT-guided left SI joint injection with local anesthetic only to try to evaluate the contribution of that to her overall pain. Return to clinic 1-2 weeks after that injection with pain journal to discuss next steps.   If that has zero effect on pain, I will evaluate the patient in clinic, and after that point, the patient will require standing long-cassette xrays with full limb evaluation on EOS machine to evaluate sagittal and coronal spinal balance; CT lumbar spine and CT pelvis without contrast, evaluate arthrodesis status of SI joint fusion and evaluate scoliosis; and DEXA scan due to osteoporosis risk factors.   Josette Molt MD, [facility] Associate Professor, Neurosurgery and Orthopaedic Surgery [facility] [address] [place], [place] [address] Appointments: [phone]   05/23/2024 9:09 AM   Attestation Statement:   I personally performed the service. (TP)  KRISTEN ALMARIE MOLT, MD    I spent a total of 30 minutes in both face-to-face and non-face-to-face activities, excluding procedures performed, for this visit on the date of this encounter.    *Some images could not be shown.

## 2024-05-26 ENCOUNTER — Other Ambulatory Visit (HOSPITAL_COMMUNITY): Payer: Self-pay

## 2024-05-26 ENCOUNTER — Telehealth: Payer: Self-pay | Admitting: Pharmacy Technician

## 2024-05-26 NOTE — Telephone Encounter (Signed)
 Pharmacy Patient Advocate Encounter   Received notification from CoverMyMeds that prior authorization for Nurtec 75MG  dispersible tablets is required/requested.   Insurance verification completed.   The patient is insured through [facility] .   Per test claim: PA required; PA submitted to above mentioned insurance via CoverMyMeds Key/confirmation #/EOC [facility] Status is pending

## 2024-05-27 NOTE — Telephone Encounter (Signed)
 Pharmacy Patient Advocate Encounter  Received notification from [facility] that Prior Authorization for Nurtec 75MG  dispersible tablets  has been APPROVED from 05/26/2024 to 05/26/2025   PA #/Case ID/Reference #: [id number]

## 2024-06-09 ENCOUNTER — Telehealth: Payer: Self-pay

## 2024-06-09 NOTE — Telephone Encounter (Signed)
 Faxed Patient assistance program info faxed to [facility] cares

## 2024-06-14 NOTE — Progress Notes (Unsigned)
 Office: [phone]  /  Fax: [phone]   Initial Visit    Ashley [name] was seen in clinic today to evaluate for obesity. She is interested in losing weight to improve overall health and reduce the risk of weight related complications. She presents today to review program treatment options, initial physical assessment, and evaluation.     She was referred by: PCP  When asked what else they would like to accomplish? She states: Adopt a healthier eating pattern and lifestyle, Improve energy levels and physical activity, Improve existing medical conditions, Reduce number of medications, Improve quality of life, Improve appearance, and Lose 35-40 lbs  When asked how has your weight affected you? She states: Relationships, Contributed to medical problems, Contributed to orthopedic problems or mobility issues, Having fatigue, Having poor endurance, and Problems with eating patterns  Weight history: Ashley [name] is a 68 year old female with obesity who presents for initial obesity treatment evaluation.  She has struggled with weight management during her later young adult years. 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   Her weight peaked at 262 pounds in 2003-2004, but she successfully lost weight in 2007 through exercise and dietary changes. Recently, her weight has fluctuated, and she is currently at 214 pounds, aiming to lose 35 to 40 pounds.  Her diet is plant-based, identifying as a pescatarian, and includes fish and skinless, boneless chicken breast. She avoids processed foods and practices intermittent fasting from 11 AM to 7 PM. Despite these efforts, she has struggled with weight gain due to reduced physical activity following a fall in 2016 that resulted in a torn SI joint, requiring two surgeries. This injury has limited her ability to exercise, contributing to her weight  gain.  She has a history of prediabetes and was previously on Ozempic, which helped with weight loss, but she had to discontinue it due to insurance coverage issues.   Since stopping Ozempic, she has regained 13 pounds. She is hesitant to take metformin due to concerns about side effects and reports already taking many medications.  Her medical history includes high blood pressure, arthritis affecting multiple joints including her hands, wrists, and spine, fibromyalgia, asthma, G6PD deficiency, GERD, obstructive sleep apnea, and chronic fatigue syndrome. She experiences chronic pain, which limits her physical activity, and uses nortriptyline  and CBD/THC gummies for pain management and sleep. She also reports bilateral sciatica and nerve pain.  Family history is significant for obesity. She is the primary caregiver for her mother, who has dementia and Alzheimer's, and her brother, who is an extreme diabetic. This caregiving role adds stress and impacts her ability to focus on her own health needs.  She experiences chronic fatigue, poor endurance, and occasionally skips meals due to being busy. She has a history of multiple weight loss attempts, including low-carb diets and portion control, and currently engages in Pilates with a physical therapist and receives weekly massages for pain management.  Highest weight: 262 lbs 2003-2004  Some associated conditions: Hypertension, Arthritis:multiple joints- hands/wrist/spine, Hyperlipidemia, OSA, Prediabetes, GERD, Lung disease, Vitamin D Deficiency, Connective tissue disease: fibromyalgia, and Other: G6PD  Contributing factors: family history of obesity, disruption of circadian rhythm / sleep disordered breathing, use of obesogenic medications: Psychotropic medications, Antiepileptics, and Anticholinergics, moderate to high levels of stress, reduced physical activity, chronic skipping of meals, menopause, slow metabolism for age, enticing relationships and  enviroment, multiple weight loss attempts  in the past, sedentary job, hectic pace of life, and self - critic or all-or-none mindset  Weight promoting medications identified: Psychotropic medications, Antiepileptics, and Anticholinergics  Prior weight loss attempts: Low Carb, Tracking and Journaling, Balanced Plate / Portion Control, Intermittent fasting, High Protein, and Vegetarian  Current nutrition plan: None, Low-carb, High-protein, Vegetarian, and Portion control / smart choices  Current level of physical activity: Limited due to chronic pain or orthopedic problems and Other: modified pilates with physical therapist  Current or previous pharmacotherapy: GLP-1 Ozempic and lost weight but then insurance dropped coverage and has regained  Response to medication: Lost weight initially but was unable to sustain weight loss and Was cost prohibitive or lost coverage for AOM   Past medical history includes:   Past Medical History:  Diagnosis Date   Anemia    Anxiety 2020   Arthritis    Asthma 2013-2014   Depression    Dyspnea 2013   Fibromyalgia 09/04/2014   GERD (gastroesophageal reflux disease)    Glucose-6-phosphate dehydrogenase deficiency    Headache    History of hiatal hernia    Hypertension    Lumbar radicular pain 09/04/2014   Pre-diabetes    Sacroiliac joint pain    nonunion of left SI joint   Sleep apnea 2016   Spinal headache    TIA (transient ischemic attack)    Wears glasses      Objective    BP (!) 158/74   Pulse 81   Temp 97.7 F (36.5 C)   Ht 5' 9 (1.753 m)   Wt 214 lb (97.1 kg)   SpO2 99%   BMI 31.60 kg/m  She was weighed on the bioimpedance scale: Body mass index is 31.6 kg/m.  Body Fat%:45.2%, Visceral Fat Rating:12, Weight trend over the last 12 months: Increasing  General:  Alert, oriented and cooperative. Patient is in no acute distress.  Respiratory: Normal respiratory effort, no problems with respiration noted   Gait: able to ambulate  independently  Mental Status: Normal mood and affect. Normal behavior. Normal judgment and thought content.   DIAGNOSTIC DATA REVIEWED:  BMET    Component Value Date/Time   NA 138 03/11/2024 1119   K 4.2 03/11/2024 1119   CL 101 03/11/2024 1119   CO2 25 03/11/2024 1119   GLUCOSE 112 (H) 03/11/2024 1119   BUN 12 03/11/2024 1119   CREATININE 0.74 03/11/2024 1119   CALCIUM  9.6 03/11/2024 1119   GFRNONAA >60 03/11/2024 1119   GFRAA >60 08/05/2018 0923   No results found for: HGBA1C No results found for: INSULIN CBC    Component Value Date/Time   WBC 4.8 05/16/2023 1633   RBC 5.20 (H) 05/16/2023 1633   HGB 14.4 05/16/2023 1633   HCT 45.0 05/16/2023 1633   PLT 271 05/16/2023 1633   MCV 86.5 05/16/2023 1633   MCH 27.7 05/16/2023 1633   MCHC 32.0 05/16/2023 1633   RDW 13.3 05/16/2023 1633   Iron/TIBC/Ferritin/ %Sat No results found for: IRON, TIBC, FERRITIN, IRONPCTSAT Lipid Panel  No results found for: CHOL, TRIG, HDL, CHOLHDL, VLDL, LDLCALC, LDLDIRECT Hepatic Function Panel     Component Value Date/Time   PROT 7.5 05/16/2023 1633   ALBUMIN 4.8 05/16/2023 1633   AST 17 05/16/2023 1633   ALT 16 05/16/2023 1633   ALKPHOS 90 05/16/2023 1633   BILITOT 0.9 05/16/2023 1633   No results found for: TSH   Assessment and Plan   Asthmatic bronchitis without complication, unspecified asthma severity, unspecified whether persistent  Gastroesophageal reflux disease without esophagitis  Chronic left-sided low back pain with left-sided sciatica  History of migraine  Menopause  Sacroiliac joint dysfunction  Benign hypertension  Spondylosis of lumbar region without myelopathy or radiculopathy  Allergic rhinitis due to pollen, unspecified seasonality  Schatzki's ring  Depressive disorder  Fibromyalgia  Myofascial pain  G6PD deficiency  Hiatal hernia  Xerostomia  Class 1 obesity due to excess calories with serious comorbidity and  body mass index (BMI) of 31.0 to 31.9 in adult  Current BMI 31.6  Assessment and Plan Assessment & Plan       Obesity Chronic obesity with weight fluctuations, previously managed with Ozempic for prediabetes. Current weight is 214 pounds with a goal to lose 35-40 pounds. High body adipose percentage at 45%, target is 35% or less to reduce cardiovascular and cancer risks. Limited physical activity due to chronic pain and other health issues. High stress levels and caregiving responsibilities contribute to weight management challenges. Prefers injections over pills due to medication load and cost. - Enroll in weight management program focusing on overall wellness and weight loss. - Conduct metabolism test to determine caloric needs and macronutrient distribution. - Develop personalized nutrition plan accommodating pescatarian diet. - Schedule bi-weekly follow-up visits for the first three months, then monthly as needed. - Consider medication options for weight management, including potential use of Wegovy or similar medications if insurance allows.  Obstructive sleep apnea Diagnosed obstructive sleep apnea with potential improvement through weight loss. - Consider medication options like Mounjaro or similar for weight loss and sleep apnea management if insurance allows.  Prediabetes Prediabetes.  Hypertension Elevated blood pressure potentially due to pain and stress. Requires ongoing management.  Hyperlipidemia Chronic hyperlipidemia with potential management through weight loss and medication options like Wegovy if insurance allows.  Gastroesophageal reflux disease (GERD) Chronic GERD potentially exacerbated by obesity. Weight loss may alleviate symptoms.  Osteoarthritis involving multiple joints including hands, wrists, and spine Chronic osteoarthritis affecting hands, wrists, and spine, contributing to chronic pain and limiting physical activity.  Fibromyalgia Chronic  fibromyalgia contributing to overall pain and fatigue.  Chronic pain syndrome Chronic pain syndrome related to osteoarthritis, fibromyalgia, and bilateral sciatica. Pain management is a significant component of overall health strategy.  Bilateral sciatica Chronic bilateral sciatica contributing to pain and limited mobility.  Chronic fatigue syndrome Chronic fatigue syndrome contributing to overall fatigue and reduced physical activity.  Asthma Asthma.  G6PD deficiency G6PD deficiency noted.  Vitamin D deficiency Chronic vitamin D deficiency.  Obesity Treatment / Action Plan:  Patient will work on garnering support from family and friends to begin weight loss journey. Will work on eliminating or reducing the presence of highly palatable, calorie dense foods in the home. Will complete provided nutritional and psychosocial assessment questionnaire before the next appointment. Will be scheduled for indirect calorimetry to determine resting energy expenditure in a fasting state.  This will allow us  to create a reduced calorie, high-protein meal plan to promote loss of fat mass while preserving muscle mass. Will think about ideas on how to incorporate physical activity into their daily routine. Will avoid skipping meals which may result in increased hunger signals and overeating at certain times. Will work on managing stress via relaxation methods as this may result in unhealthy eating patterns. Counseled on the health benefits of losing 5%-15% of total body weight. Will work on improving sleep hygiene and trying to obtain at least 7 hours of sleep. Was counseled on nutritional approaches to weight loss and benefits  of reducing processed foods and consuming plant-based foods and high quality protein as part of nutritional weight management. Was counseled on pharmacotherapy and role as an adjunct in weight management.  Will work on increasing water intake with a goal of 125 ounces for men  and 91 ounces for women.   Obesity Education Performed Today:  She was weighed on the bioimpedance scale and results were discussed and documented in the synopsis.  We discussed obesity as a disease and the importance of a more detailed evaluation of all the factors contributing to the disease.  We discussed the importance of long term lifestyle changes which include nutrition, exercise and behavioral modifications as well as the importance of customizing this to her specific health and social needs.  We discussed the benefits of reaching a healthier weight to alleviate the symptoms of existing conditions and reduce the risks of the biomechanical, metabolic and psychological effects of obesity.  We reviewed the four pillars of obesity medicine and importance of using a multimodal approach.  We reviewed the basic principles in weight management.   Ashley [name] appears to be in the action stage of change and states they are ready to start intensive lifestyle modifications and behavioral modifications.  I have spent 39 minutes in the care of the patient today including: 4 minutes before the visit reviewing and preparing the chart. 30 minutes face-to-face assessing and reviewing listed medical problems as outlined in obesity care plan, providing nutritional and behavioral counseling on topics outlined in the obesity care plan, independently interpreting test results and goals of care, as described in assessment and plan, reviewing and discussing biometric information and progress, and reviewing latest PCP notes and specialist consultations 5 minutes after the visit updating chart and documentation of encounter.  Reviewed by clinician on day of visit: allergies, medications, problem list, medical history, surgical history, family history, social history, and previous encounter notes pertinent to obesity diagnosis.   Ivylynn Hoppes,PA-C

## 2024-06-14 NOTE — Progress Notes (Signed)
  [facility] Care Management - Unable to Reach for New Service Line Referral  [facility] Coordinator made multiple attempts to contact the patient for a community resources. Contact information was left with patient.  At this time, we have not been able to connect Ashley [name] to [employer].   SHYAMAL HASAN    [address]; [place], [place] [address] l  [email or web address] l [phone]   For more information on [facility] services, click here. [facility] Centralized Support Referral Closure Note Ashley [name] was referred by provider for assistance with health-related social needs:  Care concerns addressed as described above. No further action needed at this time. Closing case.  Provider notified on: 06/14/2024.    SHYAMAL HASAN    [address]; [place], [place] [address] l  [email or web address] l [phone]   For more information on [facility] services, click here.

## 2024-06-15 ENCOUNTER — Encounter (INDEPENDENT_AMBULATORY_CARE_PROVIDER_SITE_OTHER): Payer: Self-pay | Admitting: Physician Assistant

## 2024-06-15 ENCOUNTER — Ambulatory Visit (INDEPENDENT_AMBULATORY_CARE_PROVIDER_SITE_OTHER): Admitting: Physician Assistant

## 2024-06-15 VITALS — BP 158/74 | HR 81 | Temp 97.7°F | Ht 69.0 in | Wt 214.0 lb

## 2024-06-15 DIAGNOSIS — Z8669 Personal history of other diseases of the nervous system and sense organs: Secondary | ICD-10-CM

## 2024-06-15 DIAGNOSIS — M5442 Lumbago with sciatica, left side: Secondary | ICD-10-CM

## 2024-06-15 DIAGNOSIS — I10 Essential (primary) hypertension: Secondary | ICD-10-CM

## 2024-06-15 DIAGNOSIS — Z780 Asymptomatic menopausal state: Secondary | ICD-10-CM

## 2024-06-15 DIAGNOSIS — K117 Disturbances of salivary secretion: Secondary | ICD-10-CM

## 2024-06-15 DIAGNOSIS — M47816 Spondylosis without myelopathy or radiculopathy, lumbar region: Secondary | ICD-10-CM

## 2024-06-15 DIAGNOSIS — K219 Gastro-esophageal reflux disease without esophagitis: Secondary | ICD-10-CM

## 2024-06-15 DIAGNOSIS — E66811 Obesity, class 1: Secondary | ICD-10-CM

## 2024-06-15 DIAGNOSIS — K449 Diaphragmatic hernia without obstruction or gangrene: Secondary | ICD-10-CM

## 2024-06-15 DIAGNOSIS — E6609 Other obesity due to excess calories: Secondary | ICD-10-CM

## 2024-06-15 DIAGNOSIS — M533 Sacrococcygeal disorders, not elsewhere classified: Secondary | ICD-10-CM

## 2024-06-15 DIAGNOSIS — D75A Glucose-6-phosphate dehydrogenase (G6PD) deficiency without anemia: Secondary | ICD-10-CM

## 2024-06-15 DIAGNOSIS — Z6831 Body mass index (BMI) 31.0-31.9, adult: Secondary | ICD-10-CM

## 2024-06-15 DIAGNOSIS — J45909 Unspecified asthma, uncomplicated: Secondary | ICD-10-CM

## 2024-06-15 DIAGNOSIS — M797 Fibromyalgia: Secondary | ICD-10-CM

## 2024-06-15 DIAGNOSIS — M159 Polyosteoarthritis, unspecified: Secondary | ICD-10-CM

## 2024-06-15 DIAGNOSIS — K222 Esophageal obstruction: Secondary | ICD-10-CM

## 2024-06-15 DIAGNOSIS — F32A Depression, unspecified: Secondary | ICD-10-CM

## 2024-06-15 DIAGNOSIS — M7918 Myalgia, other site: Secondary | ICD-10-CM

## 2024-06-15 DIAGNOSIS — G8929 Other chronic pain: Secondary | ICD-10-CM

## 2024-06-15 DIAGNOSIS — J301 Allergic rhinitis due to pollen: Secondary | ICD-10-CM

## 2024-06-16 ENCOUNTER — Emergency Department (HOSPITAL_BASED_OUTPATIENT_CLINIC_OR_DEPARTMENT_OTHER)
Admission: EM | Admit: 2024-06-16 | Discharge: 2024-06-16 | Disposition: A | Discharge status: Home / Self Care | Attending: Emergency Medicine | Admitting: Emergency Medicine

## 2024-06-16 ENCOUNTER — Other Ambulatory Visit: Payer: Self-pay

## 2024-06-16 DIAGNOSIS — Z91040 Latex allergy status: Secondary | ICD-10-CM | POA: Insufficient documentation

## 2024-06-16 DIAGNOSIS — Z79899 Other long term (current) drug therapy: Secondary | ICD-10-CM | POA: Insufficient documentation

## 2024-06-16 DIAGNOSIS — R519 Headache, unspecified: Secondary | ICD-10-CM | POA: Diagnosis present

## 2024-06-16 DIAGNOSIS — I10 Essential (primary) hypertension: Secondary | ICD-10-CM | POA: Insufficient documentation

## 2024-06-16 DIAGNOSIS — H1132 Conjunctival hemorrhage, left eye: Secondary | ICD-10-CM | POA: Diagnosis not present

## 2024-06-16 DIAGNOSIS — Z7951 Long term (current) use of inhaled steroids: Secondary | ICD-10-CM | POA: Diagnosis not present

## 2024-06-16 DIAGNOSIS — J45909 Unspecified asthma, uncomplicated: Secondary | ICD-10-CM | POA: Diagnosis not present

## 2024-06-16 DIAGNOSIS — Z8673 Personal history of transient ischemic attack (TIA), and cerebral infarction without residual deficits: Secondary | ICD-10-CM | POA: Insufficient documentation

## 2024-06-16 LAB — BASIC METABOLIC PANEL WITH GFR
Anion gap: 14 (ref 5–15)
BUN: 13 mg/dL (ref 8–23)
CO2: 27 mmol/L (ref 22–32)
Calcium: 9.7 mg/dL (ref 8.9–10.3)
Chloride: 98 mmol/L (ref 98–111)
Creatinine, Ser: 0.88 mg/dL (ref 0.44–1.00)
GFR, Estimated: >60 mL/min (ref >60)
Glucose, Bld: 165 mg/dL — ABNORMAL HIGH (ref 70–99)
Potassium: 3.7 mmol/L (ref 3.5–5.1)
Sodium: 138 mmol/L (ref 135–145)

## 2024-06-16 LAB — CBC WITH DIFFERENTIAL/PLATELET
Abs Immature Granulocytes: 0.02 K/uL (ref 0.00–0.07)
Basophils Absolute: 0 K/uL (ref 0.0–0.1)
Basophils Relative: 0 %
Eosinophils Absolute: 0.1 K/uL (ref 0.0–0.5)
Eosinophils Relative: 2 %
HCT: 38.3 % (ref 36.0–46.0)
Hemoglobin: 12.2 g/dL (ref 12.0–15.0)
Immature Granulocytes: 0 %
Lymphocytes Relative: 35 %
Lymphs Abs: 2.6 K/uL (ref 0.7–4.0)
MCH: 27.3 pg (ref 26.0–34.0)
MCHC: 31.9 g/dL (ref 30.0–36.0)
MCV: 85.7 fL (ref 80.0–100.0)
Monocytes Absolute: 0.4 K/uL (ref 0.1–1.0)
Monocytes Relative: 6 %
Neutro Abs: 4.1 K/uL (ref 1.7–7.7)
Neutrophils Relative %: 57 %
Platelets: 227 K/uL (ref 150–400)
RBC: 4.47 MIL/uL (ref 3.87–5.11)
RDW: 13.9 % (ref 11.5–15.5)
WBC: 7.3 K/uL (ref 4.0–10.5)
nRBC: 0 % (ref 0.0–0.2)

## 2024-06-16 MED ORDER — TETRACAINE HCL 0.5 % OP SOLN
2.0000 [drp] | Freq: Once | OPHTHALMIC | Status: AC
  Administered 2024-06-16: 2 [drp] via OPHTHALMIC
  Filled 2024-06-16: qty 4

## 2024-06-16 MED ORDER — FLUORESCEIN SODIUM 1 MG OP STRP
1.0000 | ORAL_STRIP | Freq: Once | OPHTHALMIC | Status: AC
  Administered 2024-06-16: 1 via OPHTHALMIC
  Filled 2024-06-16: qty 1

## 2024-06-16 NOTE — Progress Notes (Signed)
 Pre procedure pain assessment: 7 on a scale of 0-10.     Location: Pain, per patient, originates at the left sacroiliac joint and travels down the left glute to the left posterior thigh, to the left posterior calf to the dorsal side of the foot affecting digits 1 & 2. The patient also reports shooting pain into the left groin and medial anterior thigh. Patient reports similar pain on the right.   Last  injection: 07/09/21  % relief and length of relief from last injection: unknown by patient, reports relief for about a day.  Presents this date with a driver. Allergies reviewed with patient. No history of adverse reaction to local anesthetic, iodinated contrast media or steroid. Pt reports no to a history of diabetes and no to taking blood thinning medications.   Tolerated procedure well. Given contact information and verbal instructions by Dr. Massie Fireman, MD. Also provided with written post procedure instructions: Common Questions After Your Spine Injection.

## 2024-06-16 NOTE — ED Notes (Signed)
 Ophthalmic strip & tetracaine  at bedside for EDP

## 2024-06-16 NOTE — ED Notes (Signed)
 Reviewed discharge instructions and follow-up care with pt. Pt verbalized understanding and had no further questions. Pt exited ED without complications.

## 2024-06-16 NOTE — ED Triage Notes (Signed)
 Pt POV reporting headache, HTN, and bloodshot L eye following CT scan with contrast earlier today. Reports blurred vision in eye. No unilateral weakness noted. Hx TIA with vision changes.

## 2024-06-17 NOTE — ED Provider Notes (Signed)
 [place] EMERGENCY DEPARTMENT AT [facility] Provider Note   CSN: [id number] Arrival date & time: 06/16/24  2007     Patient presents with: Hypertension, Headache, and Eye Problem   Ashley [name] is a 68 y.o. female.    Hypertension Associated symptoms include headaches.  Headache Eye Problem Associated symptoms: headaches   Patient presents with few different planes.  Had been getting a CT scan with injection for left SI joint.  After that began to have headache some high blood pressure and some redness in her left eye.  States some blurred vision in the eye.  Worried because she has a TIA previously that involved left eye complaints.  Not on blood thinners.  No other numbness or weakness.  Does deal with chronic pain in the back.    Past Medical History:  Diagnosis Date   Anemia    Anxiety 2020   Arthritis    Asthma 2013-2014   Depression    Dyspnea 2013   Fibromyalgia 09/04/2014   GERD (gastroesophageal reflux disease)    Glucose-6-phosphate dehydrogenase deficiency    Headache    History of hiatal hernia    Hypertension    Lumbar radicular pain 09/04/2014   Pre-diabetes    Sacroiliac joint pain    nonunion of left SI joint   Sleep apnea 2016   Spinal headache    TIA (transient ischemic attack)    Wears glasses     Prior to Admission medications   Medication Sig Start Date End Date Taking? Authorizing Provider  acetaminophen  (TYLENOL ) 650 MG CR tablet Take by mouth.    [provider]  albuterol  (VENTOLIN  HFA) 108 (90 Base) MCG/ACT inhaler Inhale 1-2 puffs into the lungs every 6 (six) hours as needed for wheezing or shortness of breath. 10/24/22   Shelah Lamar RAMAN, MD  atorvastatin  (LIPITOR) 20 MG tablet Take 20 mg by mouth daily.  02/17/19   [provider]  atorvastatin  (LIPITOR) 40 MG tablet 1 tablet Orally Once a day for 90 days    [provider]  azelastine  (OPTIVAR ) 0.05 % ophthalmic solution Place 2 drops  into both eyes 2 (two) times daily. 10/24/22   Shelah Lamar RAMAN, MD  baclofen  (LIORESAL ) 20 MG tablet Take 1 tablet (20 mg total) by mouth 3 (three) times daily. 07/01/15   Devora Perkins, PA-C  BELBUCA 150 MCG FILM Take 1 strip by mouth 2 (two) times daily. 07/05/21   [provider]  budesonide -formoterol  (SYMBICORT ) 80-4.5 MCG/ACT inhaler Inhale 2 puffs into the lungs 2 (two) times daily. 10/24/22   Shelah Lamar RAMAN, MD  Cholecalciferol (VITAMIN D-3) 125 MCG (5000 UT) TABS Take by mouth.    [provider]  diphenhydrAMINE  (BENADRYL ) 25 MG tablet Take 25 mg by mouth at bedtime as needed. Take with oxycodone /acetaminophen  to prevent itching.    [provider]  DULoxetine  (CYMBALTA ) 30 MG capsule Take 30 mg by mouth daily. 08/22/20   [provider]  DULoxetine  (CYMBALTA ) 60 MG capsule Take 60 mg by mouth daily.     [provider]  fluticasone  (FLONASE ) 50 MCG/ACT nasal spray Place 2 sprays into both nostrils 2 (two) times daily. 08/27/17   Shelah Lamar RAMAN, MD  gabapentin  (NEURONTIN ) 800 MG tablet Take 1 tablet (800 mg total) by mouth 3 (three) times daily. 07/07/17   Kirsteins, Prentice BRAVO, MD  Galcanezumab -gnlm (EMGALITY ) 120 MG/ML SOAJ Inject 1 pen  into the skin every 30 (thirty) days. 05/20/24   Gayland Domino  J, NP  hydrochlorothiazide  (HYDRODIURIL ) 12.5 MG tablet Take 12.5 mg by mouth 2 (two) times daily with breakfast and lunch.  07/23/18   [provider]  hydroquinone 4 % cream APPLY A SMALL AMOUNT TOPICALLY 11/12/23   [provider]  losartan  (COZAAR ) 25 MG tablet Take 25 mg by mouth 2 (two) times daily with breakfast and lunch.  07/23/18   [provider]  MAGNESIUM  CITRATE PO Take by mouth.    [provider]  montelukast  (SINGULAIR ) 10 MG tablet Take 1 tablet (10 mg total) by mouth at bedtime. 04/27/19   Byrum, Robert S, MD  Multiple Vitamin (MULTIVITAMIN WITH MINERALS) TABS tablet Take 1 tablet by mouth daily at 3 pm.  [facility]    [provider]  naproxen  (NAPROSYN ) 500 MG tablet SMARTSIG:1 Tablet(s) By Mouth Every 12 Hours PRN    [provider]  nortriptyline  (PAMELOR ) 25 MG capsule Take 1 capsule (25 mg total) by mouth at bedtime. 04/03/17   Gladis Inocente Coy, NP  ondansetron  (ZOFRAN ) 4 MG tablet Take 1 tablet (4 mg total) by mouth every 8 (eight) hours as needed for nausea or vomiting. 04/21/16   Gladis Inocente Coy, NP  ondansetron  (ZOFRAN -ODT) 4 MG disintegrating tablet Take 1 tablet (4 mg total) by mouth every 8 (eight) hours as needed for nausea or vomiting. 05/16/23   Smoot, Lauraine LABOR, PA-C  Polyethyl Glycol-Propyl Glycol (SYSTANE) 0.4-0.3 % SOLN Place 1 drop into both eyes every 6 (six) hours as needed (dry eyes). 10/24/22   Shelah Lamar RAMAN, MD  pregabalin  (LYRICA ) 25 MG capsule Take 25 mg by mouth 2 (two) times daily.    [provider]  promethazine  (PHENERGAN ) 12.5 MG tablet Take 12.5-25 mg by mouth every 6 (six) hours as needed for nausea or vomiting.    [provider]  Rimegepant Sulfate (NURTEC) 75 MG TBDP DISSOLVE 1 TABLET IN MOUTH AS NEEDED 05/20/24   Gayland Lauraine PARAS, NP  Semaglutide (OZEMPIC, 0.25 OR 0.5 MG/DOSE, [place]) Inject 2.5 mg into the skin every 7 (seven) days.    [provider]    Allergies: Latex, Sulfa antibiotics, Aspirin, Tetracyclines & related, Wound dressing adhesive, Oxycodone , and Tramadol    Review of Systems  Neurological:  Positive for headaches.    Updated Vital Signs BP 125/63   Pulse 81   Temp 98.4 F (36.9 C) (Oral)   Resp 18   Ht 5' 9 (1.753 m)   Wt 97.1 kg   SpO2 93%   BMI 31.60 kg/m   Physical Exam Vitals and nursing note reviewed.  Eyes:     Comments: Arm was intact.  Visual fields intact.  Does have subconjunctival hemorrhage on the left side.  Intraocular pressure of 16 on left.  No corneal uptake on fluorescein  exam.  Cardiovascular:     Rate and Rhythm: Regular rhythm.  Pulmonary:      Effort: Pulmonary effort is normal.  Neurological:     Mental Status: She is alert.     (all labs ordered are listed, but only abnormal results are displayed) Labs Reviewed  BASIC METABOLIC PANEL WITH GFR - Abnormal; Notable for the following components:      Result Value   Glucose, Bld 165 (*)    All other components within normal limits  CBC WITH DIFFERENTIAL/PLATELET    EKG: None  Radiology: No results found.   Procedures   Medications Ordered in the ED  fluorescein  ophthalmic strip 1 strip (1 strip Left  Eye Given 06/16/24 2131)  tetracaine  (PONTOCAINE) 0.5 % ophthalmic solution 2 drop (2 drops Left Eye Given 06/16/24 2131)                                    Medical Decision Making Amount and/or Complexity of Data Reviewed Labs: ordered.  Risk Prescription drug management.  Patient appears complaints.  Headache.  High blood pressure had resolved.  But also had subconjunctival hemorrhage.  Does not appear more extensive.  Vision is intact.  Good intraocular pressure.  Not sure necessarily how this would be related to the injection or the CT but I think most likely self-limited.  Follow-up with ophthalmology as needed.  Will discharge home.     Final diagnoses:  Subconjunctival hematoma, left    ED Discharge Orders     None          Patsey Lot, MD 06/17/24 1453

## 2024-06-28 ENCOUNTER — Other Ambulatory Visit: Payer: Self-pay | Admitting: Neurosurgery

## 2024-06-28 ENCOUNTER — Other Ambulatory Visit: Payer: Self-pay | Admitting: Family Medicine

## 2024-06-28 DIAGNOSIS — G8929 Other chronic pain: Secondary | ICD-10-CM

## 2024-06-28 DIAGNOSIS — M4155 Other secondary scoliosis, thoracolumbar region: Secondary | ICD-10-CM

## 2024-06-28 DIAGNOSIS — Z1231 Encounter for screening mammogram for malignant neoplasm of breast: Secondary | ICD-10-CM

## 2024-07-08 ENCOUNTER — Ambulatory Visit
Admission: RE | Admit: 2024-07-08 | Discharge: 2024-07-08 | Disposition: A | Discharge status: Home / Self Care | Source: Ambulatory Visit | Attending: Neurosurgery | Admitting: Neurosurgery

## 2024-07-08 DIAGNOSIS — M4155 Other secondary scoliosis, thoracolumbar region: Secondary | ICD-10-CM

## 2024-07-08 DIAGNOSIS — G8929 Other chronic pain: Secondary | ICD-10-CM

## 2024-07-22 ENCOUNTER — Ambulatory Visit
Admission: RE | Admit: 2024-07-22 | Discharge: 2024-07-22 | Disposition: A | Discharge status: Home / Self Care | Source: Ambulatory Visit | Attending: Family Medicine | Admitting: Family Medicine

## 2024-07-22 DIAGNOSIS — Z1231 Encounter for screening mammogram for malignant neoplasm of breast: Secondary | ICD-10-CM

## 2024-07-27 ENCOUNTER — Other Ambulatory Visit: Payer: Self-pay | Admitting: Family Medicine

## 2024-07-27 DIAGNOSIS — N63 Unspecified lump in unspecified breast: Secondary | ICD-10-CM

## 2024-08-09 ENCOUNTER — Ambulatory Visit
Admission: RE | Admit: 2024-08-09 | Discharge: 2024-08-09 | Disposition: A | Discharge status: Home / Self Care | Source: Ambulatory Visit | Attending: Family Medicine | Admitting: Family Medicine

## 2024-08-09 ENCOUNTER — Ambulatory Visit
Admission: RE | Admit: 2024-08-09 | Discharge: 2024-08-09 | Disposition: A | Source: Ambulatory Visit | Attending: Family Medicine | Admitting: Family Medicine

## 2024-08-09 DIAGNOSIS — N63 Unspecified lump in unspecified breast: Secondary | ICD-10-CM

## 2024-10-05 ENCOUNTER — Ambulatory Visit: Admitting: Podiatry

## 2024-10-05 ENCOUNTER — Ambulatory Visit (INDEPENDENT_AMBULATORY_CARE_PROVIDER_SITE_OTHER)

## 2024-10-05 DIAGNOSIS — M7752 Other enthesopathy of left foot: Secondary | ICD-10-CM | POA: Diagnosis not present

## 2024-10-05 MED ORDER — TRIAMCINOLONE ACETONIDE 10 MG/ML IJ SUSP
10.0000 mg | Freq: Once | INTRAMUSCULAR | Status: AC
  Administered 2024-10-05: 10 mg via INTRA_ARTICULAR

## 2024-10-05 NOTE — Progress Notes (Signed)
 Subjective:   Patient ID: Ashley [name], female   DOB: 68 y.o.   MRN: [id number]   HPI Patient presents stating she has been getting a lot of pain in her left ankle and she is flat-footed and also has a lot of problems with back issues with history of bunion surgery with mild elevation of the hallux   Review of Systems  All other systems reviewed and are negative.       Objective:  Physical Exam Vitals and nursing note reviewed.  Constitutional:      Appearance: She is well-developed.  Pulmonary:     Effort: Pulmonary effort is normal.  Musculoskeletal:        General: Normal range of motion.  Skin:    General: Skin is warm.  Neurological:     Mental Status: She is alert.     Neurovascular status intact muscle strength adequate with patient noted to have moderate collapse medial longitudinal arch bilateral quite a bit of inflammation fluid buildup in the sinus tarsi left painful when pressed with patient noted to have good digital perfusion well-oriented x 3 with this discomfort experiencing over the last 2 months after missing a step     Assessment:  Inflammatory capsulitis sinus tarsi left with fluid buildup moderate collapse medial longitudinal arch with possibility for trauma secondary to injury     Plan:  8 H&P x-rays reviewed discussed and at this point I went ahead did sterile prep injected the sinus tarsi left 3 mg Kenalog  5 mg Xylocaine  applied sterile dressing gave instructions on support and dispensed a fascial brace to lift up the arch.  Patient will be seen back to recheck all questions answered today  X-rays indicate significant collapse medial longitudinal arch left with inflammation noted no indications of fracture or diastases injury of the ankle

## 2024-10-19 ENCOUNTER — Ambulatory Visit: Payer: Self-pay | Admitting: Podiatry

## 2024-10-19 DIAGNOSIS — Z91199 Patient's noncompliance with other medical treatment and regimen due to unspecified reason: Secondary | ICD-10-CM

## 2024-10-19 NOTE — Progress Notes (Signed)
Patient did not come in for visit.

## 2024-11-02 ENCOUNTER — Encounter: Payer: Self-pay | Admitting: Podiatry

## 2024-11-02 ENCOUNTER — Ambulatory Visit (INDEPENDENT_AMBULATORY_CARE_PROVIDER_SITE_OTHER): Payer: Self-pay | Admitting: Podiatry

## 2024-11-02 VITALS — Ht 69.0 in | Wt 214.0 lb

## 2024-11-02 DIAGNOSIS — M779 Enthesopathy, unspecified: Secondary | ICD-10-CM

## 2024-11-02 DIAGNOSIS — M7751 Other enthesopathy of right foot: Secondary | ICD-10-CM

## 2024-11-02 DIAGNOSIS — M7752 Other enthesopathy of left foot: Secondary | ICD-10-CM | POA: Diagnosis not present

## 2024-11-02 NOTE — Progress Notes (Signed)
 Subjective:   Patient ID: Ashley [name], female   DOB: 68 y.o.   MRN: [id number]   HPI Patient states still having pain but improved from previous and feels like she needs more arch support   ROS      Objective:  Physical Exam  Neurovascular status intact with collapse medial longitudinal arch bilateral and in improvement but still discomfort in the sinus tarsi left     Assessment:  Collapse medial longitudinal arch creating a lot of problems with a improved but still painful sinus tarsi left      Plan:  H&P reviewed all conditions and recommended orthotics to try to lift the arch up and take stress off the foot.  Patient is scanned for customized orthotic devices and will be fitted for these with all instructions given today

## 2024-11-18 ENCOUNTER — Telehealth: Payer: Self-pay | Admitting: Podiatry

## 2024-11-18 NOTE — Telephone Encounter (Signed)
 [facility] in [place] called patient left message on voicemail for her to call and schedule PUO.

## 2024-11-23 ENCOUNTER — Ambulatory Visit

## 2024-11-23 DIAGNOSIS — M7752 Other enthesopathy of left foot: Secondary | ICD-10-CM

## 2024-11-23 DIAGNOSIS — M779 Enthesopathy, unspecified: Secondary | ICD-10-CM

## 2024-11-23 NOTE — Progress Notes (Signed)

## 2025-06-21 ENCOUNTER — Telehealth: Admitting: Neurology
